# Patient Record
Sex: Male | Born: 1935
Health system: Southern US, Community
[De-identification: ages and names within clinical notes are randomized; demographics above are authoritative.]

## PROBLEM LIST (undated history)

## (undated) DIAGNOSIS — M109 Gout, unspecified: Secondary | ICD-10-CM

## (undated) DIAGNOSIS — I10 Essential (primary) hypertension: Secondary | ICD-10-CM

## (undated) DIAGNOSIS — J45909 Unspecified asthma, uncomplicated: Secondary | ICD-10-CM

## (undated) DIAGNOSIS — N289 Disorder of kidney and ureter, unspecified: Secondary | ICD-10-CM

## (undated) DIAGNOSIS — Z923 Personal history of irradiation: Secondary | ICD-10-CM

## (undated) DIAGNOSIS — C4491 Basal cell carcinoma of skin, unspecified: Secondary | ICD-10-CM

## (undated) DIAGNOSIS — N400 Enlarged prostate without lower urinary tract symptoms: Secondary | ICD-10-CM

## (undated) DIAGNOSIS — I480 Paroxysmal atrial fibrillation: Secondary | ICD-10-CM

## (undated) DIAGNOSIS — K219 Gastro-esophageal reflux disease without esophagitis: Secondary | ICD-10-CM

## (undated) HISTORY — PX: TONSILLECTOMY: SUR1361

## (undated) HISTORY — DX: Disorder of kidney and ureter, unspecified: N28.9

## (undated) HISTORY — PX: OTHER SURGICAL HISTORY: SHX169

## (undated) HISTORY — PX: EYE SURGERY: SHX253

## (undated) HISTORY — DX: Essential (primary) hypertension: I10

## (undated) HISTORY — PX: NOSE SURGERY: SHX723

## (undated) HISTORY — DX: Basal cell carcinoma of skin, unspecified: C44.91

## (undated) HISTORY — PX: TRANSURETHRAL RESECTION OF PROSTATE: SHX73

## (undated) HISTORY — DX: Gout, unspecified: M10.9

## (undated) HISTORY — DX: Benign prostatic hyperplasia without lower urinary tract symptoms: N40.0

## (undated) HISTORY — DX: Personal history of irradiation: Z92.3

---

## 2005-02-03 ENCOUNTER — Encounter: Admission: RE | Admit: 2005-02-03 | Discharge: 2005-02-03 | Payer: Self-pay | Admitting: Internal Medicine

## 2005-02-04 ENCOUNTER — Encounter: Admission: RE | Admit: 2005-02-04 | Discharge: 2005-02-04 | Payer: Self-pay | Admitting: Internal Medicine

## 2005-02-28 ENCOUNTER — Encounter: Admission: RE | Admit: 2005-02-28 | Discharge: 2005-02-28 | Payer: Self-pay | Admitting: Urology

## 2005-03-04 ENCOUNTER — Ambulatory Visit (HOSPITAL_COMMUNITY): Admission: RE | Admit: 2005-03-04 | Discharge: 2005-03-04 | Payer: Self-pay | Admitting: Urology

## 2005-03-04 ENCOUNTER — Ambulatory Visit (HOSPITAL_BASED_OUTPATIENT_CLINIC_OR_DEPARTMENT_OTHER): Admission: RE | Admit: 2005-03-04 | Discharge: 2005-03-04 | Payer: Self-pay | Admitting: Urology

## 2006-01-24 ENCOUNTER — Encounter: Admission: RE | Admit: 2006-01-24 | Discharge: 2006-01-24 | Payer: Self-pay | Admitting: Internal Medicine

## 2009-12-10 ENCOUNTER — Ambulatory Visit (HOSPITAL_COMMUNITY): Admission: RE | Admit: 2009-12-10 | Discharge: 2009-12-10 | Payer: Self-pay | Admitting: Chiropractic Medicine

## 2010-08-08 ENCOUNTER — Encounter: Payer: Self-pay | Admitting: Internal Medicine

## 2010-12-03 NOTE — Op Note (Signed)
NAME:  Richard Vincent, Richard Vincent NO.:  [id number]   MEDICAL RECORD NO.:  [id number]          PATIENT TYPE:  AMB   LOCATION:  [facility]                         FACILITY:  [facility]   PHYSICIAN:  Courtney Paris, M.D.DATE OF BIRTH:  [birth year]   DATE OF PROCEDURE:  03/04/2005  DATE OF DISCHARGE:                                 OPERATIVE REPORT   PREOPERATIVE DIAGNOSES:  Obstructive uropathy with decompensation of the  bladder, chronic renal insufficiency.   POSTOPERATIVE DIAGNOSES:  Obstructive uropathy with decompensation of the  bladder, chronic renal insufficiency.   OPERATION:  Suprapubic cystostomy and transurethral needle ablation of the  prostate.   ANESTHESIA:  General.   SURGEON:  Courtney Paris, M.D.   BRIEF HISTORY:  This 77 year old patient has a decompensating bladder with  renal insufficiency for a TUNA procedure and suprapubic tube placement. He  has been in retention x2 in the past with a very large prostate. His PSA has  been high in the past in the 13-15 range and been on Proscar since 2003. His  PSAs are much lower, his last one was 3.6 in March 2006. He has chronic  renal insufficiency, his creatinine recently increased to 2.7 with  hydronephrosis on ultrasound and with a Foley catheter the creatinine came  down to 2.1 to 2.2 where it is now. Creatinine was 1.2 in June 2002. He  enters now for a transurethral needle ablation of the prostate as his 170  gram gland is too large for a TURP and with his renal insufficiency, did not  want to do a suprapubic prostatectomy at this time. This can also be done as  an outpatient and hopefully will help open him up and preserve his renal  function.   DESCRIPTION OF PROCEDURE:  The patient was placed on the operating table in  the dorsal lithotomy position after satisfactory induction of general  anesthesia. The Foley catheter was removed, he was given IV antibiotics and  a L[place]ley tractor was passed per  urethra and then pushed up on the anterior  bladder wall. I could feel the tip of the sound and was able to cut down on  this with a knife until I could push the sound through the lower abdominal  skin. The tractor blades were then opened and a 2-0 nylon suture was passed  through the blades and through the eye of the #18 Foley catheter and tied.  This was then pulled back through the urethral meatus. The stitch was cut  and then the catheter was then back to where I thought it was in the bladder  and inflated the tube. When I irrigated this fluid came per urethra. I then  deflated the balloon and then using the cystoscope under direct vision could  push this back just inside the bladder neck and then inflated the tube to  make sure it was in the bladder and it irrigated nicely at that point. He  had a very long prostate about 6.5 cm in length. The measurement of the  transrectal ultrasound was 170 grams. The cystoscope with the TUNA procedure  was then passed and under direct vision the bladder neck was identified and  the first stick of the needles at the bladder neck in the midline was with  14 mm extension. The treatment took 2 minutes and 40 seconds heating the  tissue up to 118 degrees centigrade. Then the two lateral bladder neck  incisions were done with 22 mm needle extensions with good tissue ablation.  Several transverse punctures were made with 22 mm needle extensions until a  total of 7 were done and it looks like I was able in the middle of the  prostate. When I pulled back, I could see the veru. I then did the  verumontanum laterally with 20 mm needle extensions on either side and then  did several more transverse sticks for a total of 15 but the transverse were  all done with 22 mm needle extensions. The scope was then removed and a #22  Foley catheter was inserted and the bladder was irrigated, it was slightly  pink when I irrigated this and the suprapubic tube also  irrigated well. The  suprapubic tube was left clamped off and the urethral catheter attached to a  drainage bag and he was taken to the recovery room in good condition. He had  a B&O suppository and some Toradol given. The plan is to take out the  urethral catheter in three days and just have him drain his bladder with the  suprapubic tube each time he voids for a residual. Hopefully his renal  function will be protected with this and hopefully over time the prostate  will open up where we can remove the suprapubic tube. The patient tolerated  this well. There was minimal blood loss.      Courtney Paris, M.D.  Electronically Signed     HMK/MEDQ  D:  03/04/2005  T:  03/04/2005  Job:  [id number]

## 2011-12-14 ENCOUNTER — Ambulatory Visit: Payer: Self-pay | Admitting: Cardiology

## 2012-01-04 ENCOUNTER — Other Ambulatory Visit: Payer: Self-pay | Admitting: Cardiology

## 2012-01-04 ENCOUNTER — Encounter: Payer: Self-pay | Admitting: Cardiology

## 2012-01-04 ENCOUNTER — Ambulatory Visit (INDEPENDENT_AMBULATORY_CARE_PROVIDER_SITE_OTHER): Payer: Medicare Other | Admitting: Cardiology

## 2012-01-04 VITALS — BP 132/64 | HR 50 | Ht 73.0 in | Wt 216.6 lb

## 2012-01-04 DIAGNOSIS — M109 Gout, unspecified: Secondary | ICD-10-CM

## 2012-01-04 DIAGNOSIS — R002 Palpitations: Secondary | ICD-10-CM

## 2012-01-04 DIAGNOSIS — R0600 Dyspnea, unspecified: Secondary | ICD-10-CM

## 2012-01-04 DIAGNOSIS — R0609 Other forms of dyspnea: Secondary | ICD-10-CM

## 2012-01-04 DIAGNOSIS — I10 Essential (primary) hypertension: Secondary | ICD-10-CM | POA: Insufficient documentation

## 2012-01-04 MED ORDER — METOPROLOL SUCCINATE ER 50 MG PO TB24: 50.0000 mg | ORAL_TABLET | Freq: Every day | ORAL | Status: DC

## 2012-01-04 NOTE — Progress Notes (Signed)
  HPI: 78 year old male for evaluation of palpitations. He describes occasional palpitations for approximately one year. They are sudden in onset and described as his heart racing. There is associated dyspnea and mild dizziness but no syncope or chest pain. Approximately 2 months ago he had a severe episode for 20 minutes. He checked his heart rate and it was 160. He therefore presents for further evaluation.  Current Outpatient Prescriptions  Medication Sig Dispense Refill  . allopurinol (ZYLOPRIM) 300 MG tablet Take 300 mg by mouth daily.      Marland Kitchen CALCIUM-MAGNESIUM-ZINC PO Take by mouth daily.      . Cholecalciferol 1000 UNITS capsule Take 1,000 Units by mouth daily.      . finasteride (PROSCAR) 5 MG tablet Take 5 mg by mouth daily.      . fish oil-omega-3 fatty acids 1000 MG capsule Take 1 g by mouth daily.      . Potassium 99 MG TABS Take 99 mg by mouth daily.      Marland Kitchen terazosin (HYTRIN) 10 MG capsule Take 10 mg by mouth.      . vitamin C (ASCORBIC ACID) 500 MG tablet Take 500 mg by mouth daily.        Allergies  Allergen Reactions  . Latex     Mild rash    Past Medical History  Diagnosis Date  . Hypertension   . Gout   . BPH (benign prostatic hyperplasia)   . Renal insufficiency   . Basal cell cancer     Past Surgical History  Procedure Date  . Transurethral resection of prostate   . Tonsillectomy   . Skin cancer removed     History   Social History  . Marital Status: Married    Spouse Name: N/A    Number of Children: 6  . 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 Smoking status: Former Smoker    Quit date: 10/17/1994  . Smokeless tobacco: Not on file  . Alcohol Use: Yes     Occasional  . Drug Use: Not on file  . Sexually Active: Not on file   Other Topics Concern  . Not on file   Social History Narrative  . No narrative on file    Family History  Problem Relation Age of Onset  . Coronary artery  disease      No family history    ROS: occasional leg cramps and hematochezia attributed to hemorrhoids but no fevers or chills, productive cough, hemoptysis, dysphasia, odynophagia, melena, dysuria, hematuria, rash, seizure activity, orthopnea, PND, pedal edema, claudication. Remaining systems are negative.  Physical Exam:   Blood pressure 132/64, pulse 50, height 6\' 1"  (1.854 m), weight 98.249 kg (216 lb 9.6 oz).  General:  Well developed/well nourished in NAD Skin warm/dry Patient not depressed No peripheral clubbing Back-normal HEENT-normal/normal eyelids Neck supple/normal carotid upstroke bilaterally; no bruits; no JVD; no thyromegaly chest - CTA/ normal expansion CV - Bradycardic, regular rhythm/normal S1 and S2; no murmurs, rubs or gallops;  PMI nondisplaced Abdomen -NT/ND, no HSM, no mass, + bowel sounds, no bruit 2+ femoral pulses, no bruits Ext-no edema, chords, 2+ DP Neuro-grossly nonfocal  ECG Sinus bradycardia at a rate of 50. Normal axis. Not rule out prior septal infarct.

## 2012-01-04 NOTE — Assessment & Plan Note (Signed)
Echocardiogram to quantify LV function. Exercise treadmill for risk stratification.

## 2012-01-04 NOTE — Patient Instructions (Addendum)
Your physician recommends that you schedule a follow-up appointment in: 6-8 WEEKS WITH DR CRENSHAW IN [place]  STOP ATENOLOL  START TOPROL (METOPROLOL SUCC) 50 MG ONCE DAILY WITH FOOD  Your physician recommends that you HAVE LAB WORK TODAY  Your physician has recommended that you wear an event monitor. Event monitors are medical devices that record the heart's electrical activity. Doctors most often [place] these monitors to diagnose arrhythmias. Arrhythmias are problems with the speed or rhythm of the heartbeat. The monitor is a small, portable device. You can wear one while you do your normal daily activities. This is usually used to diagnose what is causing palpitations/syncope (passing out).   Your physician has requested that you have an echocardiogram. Echocardiography is a painless test that uses sound waves to create images of your heart. It provides your doctor with information about the size and shape of your heart and how well your heart's chambers and valves are working. This procedure takes approximately one hour. There are no restrictions for this procedure.   Your physician has requested that you have an exercise tolerance test. For further information please visit [email or web address] Please also follow instruction sheet, as given    Exercise Stress Electrocardiography An exercise stress test is a heart test (EKG) which is done while you are moving. You will walk on a treadmill. This test will tell your doctor how your heart does when it is forced to work harder and how much activity you can safely handle. BEFORE THE TEST  Wear shorts or athletic pants.   Wear comfortable tennis shoes.   Women need to wear a bra that allows patches to be put on under it.  TEST  An EKG cable will be attached to your waist. This cable is hooked up to patches, which look like round stickers stuck to your chest.   You will be asked to walk on the treadmill.   You will walk until you are too  tired or until you are told to stop.   Tell the doctor right away if you have:   Chest pain.   Leg cramps.   Shortness of breath.   Dizziness.   The test may last 30 minutes to 1 hour. The timing depends on your physical condition and the condition of your heart.  AFTER THE TEST  You will rest for about 6 minutes. During this time, your heart rhythm and blood pressure will be checked.   The testing equipment will be removed from your body and you can get dressed.   You may go home or back to your hospital room. You may keep doing all your usual activities as told by your doctor.  Finding out the results of your test Ask when your test results will be ready. Make sure you get your test results. Document Released: 12/21/2007 Document Revised: 06/23/2011 Document Reviewed: 12/21/2007 [facility] Patient Information 2012 [place], [place].

## 2012-01-04 NOTE — Assessment & Plan Note (Signed)
Patient describes intermittent palpitations. He did check his heart rate with his most recent episode and it was approximately 160. His electrocardiogram shows sinus bradycardia. Plan CardioNet to further evaluate. Question SVT versus atrial fibrillation. Further therapy based on results. Check TSH and echocardiogram.

## 2012-01-04 NOTE — Assessment & Plan Note (Addendum)
Patient has mild renal insufficiency and atenolol is cleared through the kidneys. Discontinue atenolol and treat with Toprol 50 mg daily.

## 2012-01-05 LAB — TSH: TSH: 2.034 u[IU]/mL (ref 0.350–4.500)

## 2012-01-09 ENCOUNTER — Encounter: Payer: Self-pay | Admitting: Cardiology

## 2012-01-20 ENCOUNTER — Encounter: Payer: Medicare Other | Admitting: Nurse Practitioner

## 2012-01-27 ENCOUNTER — Ambulatory Visit (HOSPITAL_COMMUNITY): Payer: Medicare Other | Attending: Cardiovascular Disease | Admitting: Radiology

## 2012-01-27 DIAGNOSIS — I517 Cardiomegaly: Secondary | ICD-10-CM | POA: Insufficient documentation

## 2012-01-27 DIAGNOSIS — R002 Palpitations: Secondary | ICD-10-CM | POA: Insufficient documentation

## 2012-01-27 DIAGNOSIS — R0989 Other specified symptoms and signs involving the circulatory and respiratory systems: Secondary | ICD-10-CM

## 2012-01-27 DIAGNOSIS — R0609 Other forms of dyspnea: Secondary | ICD-10-CM | POA: Insufficient documentation

## 2012-01-27 DIAGNOSIS — I10 Essential (primary) hypertension: Secondary | ICD-10-CM | POA: Insufficient documentation

## 2012-01-27 NOTE — Progress Notes (Signed)
Echocardiogram performed.  

## 2012-01-31 ENCOUNTER — Telehealth: Payer: Self-pay | Admitting: Cardiology

## 2012-01-31 NOTE — Telephone Encounter (Signed)
PT AWARE OF ECHO RESULTS./CY 

## 2012-01-31 NOTE — Telephone Encounter (Signed)
Fu call °Pt returning your call  °

## 2012-02-08 ENCOUNTER — Ambulatory Visit (INDEPENDENT_AMBULATORY_CARE_PROVIDER_SITE_OTHER): Payer: Medicare Other | Admitting: Physician Assistant

## 2012-02-08 ENCOUNTER — Encounter: Payer: Self-pay | Admitting: Physician Assistant

## 2012-02-08 DIAGNOSIS — R0600 Dyspnea, unspecified: Secondary | ICD-10-CM

## 2012-02-08 DIAGNOSIS — R002 Palpitations: Secondary | ICD-10-CM

## 2012-02-08 NOTE — Procedures (Signed)
Exercise Treadmill Test  Pre-Exercise Testing Evaluation Rhythm: sinus bradycardia  Rate: 43   PR:  .17 QRS:  .08  QT:  .47 QTc: .40     Test  Exercise Tolerance Test Ordering MD: Olga Millers, MD  Interpreting MD: Tereso Newcomer PA-C  Unique Test No: 1  Treadmill:  1  Indication for ETT: Palps  Contraindication to ETT: No   Stress Modality: exercise - treadmill  Cardiac Imaging Performed: non   Protocol: standard Bruce - maximal  Max BP:  193/52  Max MPHR (bpm):  143 85% MPR (bpm):  122  MPHR obtained (bpm):  98 % MPHR obtained:  68%  Reached 85% MPHR (min:sec):  n/a Total Exercise Time (min-sec):  5:33  Workload in METS:  7.0 Borg Scale: 15  Reason ETT Terminated:  patient's desire to stop    ST Segment Analysis At Rest: normal ST segments - no evidence of significant ST depression With Exercise: non-specific ST changes  Other Information Arrhythmia:  occasional PVC Angina during ETT:  absent (0) Quality of ETT:  non-diagnostic  ETT Interpretation:  Patient did not achieve target heart rate  Comments: Fair exercise tolerance. No chest pain. There was dyspnea. Normal BP response to exercise. Patient did not achieve target HR. No significant ST changes at submaximal exercise.   Recommendations: Arrange Lexiscan Myoview. Follow up with Dr. Olga Millers as directed. Tereso Newcomer, PA-C  9:17 AM 02/08/2012

## 2012-02-08 NOTE — Patient Instructions (Addendum)
Your physician has requested that you have a lexiscan myoview. For further information please visit [email or web address]. Please follow instruction sheet, as given.   

## 2012-02-09 ENCOUNTER — Telehealth: Payer: Self-pay | Admitting: *Deleted

## 2012-02-09 NOTE — Telephone Encounter (Signed)
Left message for pt to call, monitor reviewed by dr Jens Som shows sinus brady with rare PAC

## 2012-02-10 NOTE — Telephone Encounter (Signed)
Spoke with pt, aware of monitor results. Pt wants to cancel myoview. He reports he stopped on the treadmill because he was tired, he could have gone longer. He has read about the stress test and does not want to do that until he is seen by dr Jens Som 03-07-12. Offered to move appt up with dr Jens Som but pt preferred to wait. myoview cancelled. Pt to call prior to appt with problems.

## 2012-02-10 NOTE — Telephone Encounter (Signed)
Pt rtn call to debra °

## 2012-02-13 ENCOUNTER — Other Ambulatory Visit (HOSPITAL_COMMUNITY): Payer: Medicare Other

## 2012-02-22 ENCOUNTER — Ambulatory Visit: Payer: Medicare Other | Admitting: Cardiology

## 2012-02-23 ENCOUNTER — Other Ambulatory Visit (HOSPITAL_COMMUNITY): Payer: Medicare Other

## 2012-03-07 ENCOUNTER — Ambulatory Visit: Payer: Medicare Other | Admitting: Cardiology

## 2012-03-22 ENCOUNTER — Encounter: Payer: Self-pay | Admitting: Cardiology

## 2012-03-22 ENCOUNTER — Ambulatory Visit (INDEPENDENT_AMBULATORY_CARE_PROVIDER_SITE_OTHER): Payer: Medicare Other | Admitting: Cardiology

## 2012-03-22 VITALS — BP 165/66 | HR 56 | Wt 219.0 lb

## 2012-03-22 DIAGNOSIS — R002 Palpitations: Secondary | ICD-10-CM

## 2012-03-22 DIAGNOSIS — I10 Essential (primary) hypertension: Secondary | ICD-10-CM

## 2012-03-22 DIAGNOSIS — R0600 Dyspnea, unspecified: Secondary | ICD-10-CM

## 2012-03-22 DIAGNOSIS — R0609 Other forms of dyspnea: Secondary | ICD-10-CM

## 2012-03-22 DIAGNOSIS — R0989 Other specified symptoms and signs involving the circulatory and respiratory systems: Secondary | ICD-10-CM

## 2012-03-22 MED ORDER — AMLODIPINE BESYLATE 5 MG PO TABS: 5.0000 mg | ORAL_TABLET | Freq: Every day | ORAL | Status: DC

## 2012-03-22 NOTE — Patient Instructions (Addendum)
Your physician wants you to follow-up in: 6 MONTHS WITH DR CRENSHAW You will receive a reminder letter in the mail two months in advance. If you don't receive a letter, please call our office to schedule the follow-up appointment.   START AMLODIPINE 5 MG ONCE DAILY 

## 2012-03-22 NOTE — Assessment & Plan Note (Signed)
Patient's blood pressure is elevated. Continue Toprol. Add Norvasc 5 mg daily. Continue to titrate as needed.

## 2012-03-22 NOTE — Progress Notes (Signed)
   HPI: 78 year old male for followup of palpitations. Echocardiogram in July of 2013 showed mild left ventricular hypertrophy and normal LV function. There was mild to moderate left atrial enlargement. The ascending aorta was mildly dilated. CardioNet in July of 2013 showed sinus bradycardia with occasional PAC. TSH 2.034. Exercise treadmill in July of 2013 showed no ST changes but patient did not achieve target heart rate. A Myoview was scheduled. Since I last saw him, he has dyspnea with more extreme activities but not routine activities. No orthopnea, PND, pedal edema, syncope or chest pain. He has brief flutters but no sustained palpitations. He did not have any sustained palpitations with a monitor in place.  Current Outpatient Prescriptions  Medication Sig Dispense Refill  . allopurinol (ZYLOPRIM) 300 MG tablet Take 300 mg by mouth daily.      Marland Kitchen CALCIUM-MAGNESIUM-ZINC PO Take by mouth daily.      . Cholecalciferol 1000 UNITS capsule Take 1,000 Units by mouth daily.      . finasteride (PROSCAR) 5 MG tablet Take 5 mg by mouth daily.      . fish oil-omega-3 fatty acids 1000 MG capsule Take 1 g by mouth daily.      . metoprolol succinate (TOPROL-XL) 50 MG 24 hr tablet Take 1 tablet (50 mg total) by mouth daily. Take with or immediately following a meal.  90 tablet  3  . Potassium 99 MG TABS Take 99 mg by mouth daily.      Marland Kitchen terazosin (HYTRIN) 10 MG capsule Take 10 mg by mouth.      . vitamin C (ASCORBIC ACID) 500 MG tablet Take 500 mg by mouth daily.         Past Medical History  Diagnosis Date  . Hypertension   . Gout   . BPH (benign prostatic hyperplasia)   . Renal insufficiency   . Basal cell cancer     Past Surgical History  Procedure Date  . Transurethral resection of prostate   . Tonsillectomy   . Skin cancer removed     History   Social History  . Marital Status: Married    Spouse Name: N/A    Number of Children: 6  . 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 Smoking status: Former Smoker    Quit date: 10/17/1994  . Smokeless tobacco: Not on file  . Alcohol Use: Yes     Occasional  . Drug Use: Not on file  . Sexually Active: Not on file   Other Topics Concern  . Not on file   Social History Narrative  . No narrative on file    ROS: no fevers or chills, productive cough, hemoptysis, dysphasia, odynophagia, melena, hematochezia, dysuria, hematuria, rash, seizure activity, orthopnea, PND, pedal edema, claudication. Remaining systems are negative.  Physical Exam: Well-developed well-nourished in no acute distress.  Skin is warm and dry.  HEENT is normal.  Neck is supple.  Chest is clear to auscultation with normal expansion.  Cardiovascular exam is bradycardic  Abdominal exam nontender or distended. No masses palpated. Extremities show no edema. neuro grossly intact

## 2012-03-22 NOTE — Assessment & Plan Note (Signed)
Previous treadmill was negative the patient did not achieve target heart rate. I do not think we need to pursue a nuclear study as his LV function is normal and he has not had chest pain.

## 2012-03-22 NOTE — Assessment & Plan Note (Signed)
Patient's echocardiogram shows normal LV function. CardioNet was unremarkable but he did not have sustained palpitations with it in place. Continue beta blocker. If he has more sustained episodes in the future we will try and obtain a rhythm strip or electrocardiogram to document his rhythm disturbance.

## 2012-11-09 ENCOUNTER — Other Ambulatory Visit: Payer: Self-pay | Admitting: Cardiology

## 2013-02-09 ENCOUNTER — Other Ambulatory Visit: Payer: Self-pay | Admitting: Cardiology

## 2013-05-01 ENCOUNTER — Other Ambulatory Visit: Payer: Self-pay | Admitting: Cardiology

## 2013-06-03 ENCOUNTER — Other Ambulatory Visit: Payer: Self-pay | Admitting: *Deleted

## 2013-06-03 MED ORDER — METOPROLOL SUCCINATE ER 50 MG PO TB24: 50.0000 mg | ORAL_TABLET | Freq: Every day | ORAL | Status: DC

## 2013-06-10 ENCOUNTER — Encounter: Payer: Self-pay | Admitting: Cardiology

## 2013-06-10 ENCOUNTER — Ambulatory Visit (INDEPENDENT_AMBULATORY_CARE_PROVIDER_SITE_OTHER): Payer: Medicare PPO | Admitting: Cardiology

## 2013-06-10 VITALS — BP 158/80 | HR 58 | Ht 73.0 in | Wt 209.0 lb

## 2013-06-10 DIAGNOSIS — I10 Essential (primary) hypertension: Secondary | ICD-10-CM

## 2013-06-10 DIAGNOSIS — R002 Palpitations: Secondary | ICD-10-CM

## 2013-06-10 DIAGNOSIS — R0609 Other forms of dyspnea: Secondary | ICD-10-CM

## 2013-06-10 DIAGNOSIS — R0600 Dyspnea, unspecified: Secondary | ICD-10-CM

## 2013-06-10 NOTE — Assessment & Plan Note (Signed)
Blood pressure is mildly elevated. However he checks this at home and it is typically controlled. Continue present medications and follow.

## 2013-06-10 NOTE — Progress Notes (Signed)
      HPI: FU palpitations. Echocardiogram in July of 2013 showed mild left ventricular hypertrophy and normal LV function. There was mild to moderate left atrial enlargement. The ascending aorta was mildly dilated. CardioNet in July of 2013 showed sinus bradycardia with occasional PAC. TSH 2.034. Exercise treadmill in July of 2013 showed no ST changes but patient did not achieve target heart rate. I last saw him in September 2013. Since then, the patient has dyspnea with more extreme activities but not with routine activities. It is relieved with rest. It is not associated with chest pain. There is no orthopnea, PND or pedal edema. There is no syncope or palpitations. There is no exertional chest pain.   Current Outpatient Prescriptions  Medication Sig Dispense Refill  . amLODipine (NORVASC) 5 MG tablet TAKE 1 TABLET (5 MG TOTAL) BY MOUTH DAILY.  90 tablet  10  . CALCIUM-MAGNESIUM-ZINC PO Take by mouth daily.      . Cholecalciferol 1000 UNITS capsule Take 1,000 Units by mouth daily.      . finasteride (PROSCAR) 5 MG tablet Take 5 mg by mouth daily.      . fish oil-omega-3 fatty acids 1000 MG capsule Take 1 g by mouth daily.      . fluocinonide cream (LIDEX) 0.05 % As directed      . metoprolol succinate (TOPROL-XL) 50 MG 24 hr tablet Take 1 tablet (50 mg total) by mouth daily. Take with or immediately following a meal.  30 tablet  0  . tamsulosin (FLOMAX) 0.4 MG CAPS capsule Take 1 capsule by mouth daily.      . vitamin C (ASCORBIC ACID) 500 MG tablet Take 500 mg by mouth daily.       No current facility-administered medications for this visit.     Past Medical History  Diagnosis Date  . Hypertension   . Gout   . BPH (benign prostatic hyperplasia)   . Renal insufficiency   . Basal cell cancer     Past Surgical History  Procedure Laterality Date  . Transurethral resection of prostate    . Tonsillectomy    . Skin cancer removed      History   Social History  . Marital Status:  Married    Spouse Name: N/A    Number of Children: 6  . 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 Smoking status: Former Smoker    Quit date: 10/17/1994  . Smokeless tobacco: Not on file  . Alcohol Use: Yes     Comment: Occasional  . Drug Use: Not on file  . Sexual Activity: Not on file   Other Topics Concern  . Not on file   Social History Narrative  . No narrative on file    ROS: no fevers or chills, productive cough, hemoptysis, dysphasia, odynophagia, melena, hematochezia, dysuria, hematuria, rash, seizure activity, orthopnea, PND, pedal edema, claudication. Remaining systems are negative.  Physical Exam: Well-developed well-nourished in no acute distress.  Skin is warm and dry.  HEENT is normal.  Neck is supple.  Chest is clear to auscultation with normal expansion.  Cardiovascular exam is regular rate and rhythm.  Abdominal exam nontender or distended. No masses palpated. Extremities show no edema. neuro grossly intact  ECG sinus rhythm at a rate of 58. Right axis deviation.

## 2013-06-10 NOTE — Assessment & Plan Note (Signed)
Previous evaluation negative. No plans for furtherworkup.

## 2013-06-10 NOTE — Assessment & Plan Note (Signed)
Controlled. Continue beta blocker. 

## 2013-06-10 NOTE — Patient Instructions (Signed)
Your physician recommends that you schedule a follow-up appointment in: ONE YEAR WITH DR Jens Som

## 2013-07-02 ENCOUNTER — Other Ambulatory Visit: Payer: Self-pay | Admitting: Cardiology

## 2014-01-26 ENCOUNTER — Other Ambulatory Visit: Payer: Self-pay | Admitting: Cardiology

## 2014-01-31 ENCOUNTER — Other Ambulatory Visit: Payer: Self-pay | Admitting: Cardiology

## 2014-04-30 ENCOUNTER — Other Ambulatory Visit: Payer: Self-pay | Admitting: Cardiology

## 2014-05-10 ENCOUNTER — Other Ambulatory Visit: Payer: Self-pay | Admitting: Cardiology

## 2014-05-20 ENCOUNTER — Ambulatory Visit (INDEPENDENT_AMBULATORY_CARE_PROVIDER_SITE_OTHER): Payer: Medicare PPO | Admitting: Cardiology

## 2014-05-20 ENCOUNTER — Encounter: Payer: Self-pay | Admitting: Cardiology

## 2014-05-20 VITALS — BP 130/60 | HR 56 | Ht 73.0 in | Wt 214.4 lb

## 2014-05-20 DIAGNOSIS — I10 Essential (primary) hypertension: Secondary | ICD-10-CM

## 2014-05-20 DIAGNOSIS — R002 Palpitations: Secondary | ICD-10-CM

## 2014-05-20 DIAGNOSIS — R0600 Dyspnea, unspecified: Secondary | ICD-10-CM

## 2014-05-20 NOTE — Progress Notes (Signed)
      HPI: FU palpitations. Echocardiogram in July of 2013 showed mild left ventricular hypertrophy and normal LV function. There was mild to moderate left atrial enlargement. The ascending aorta was mildly dilated. CardioNet in July of 2013 showed sinus bradycardia with occasional PAC. TSH 2.034. Exercise treadmill in July of 2013 showed no ST changes but patient did not achieve target heart rate. 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 Since I last saw him, the patient has dyspnea with more extreme activities but not with routine activities. It is relieved with rest. It is not associated with chest pain. There is no orthopnea, PND or pedal edema. There is no syncope or palpitations. There is no exertional chest pain.   Current Outpatient Prescriptions  Medication Sig Dispense Refill  . Cholecalciferol 1000 UNITS capsule Take 1,000 Units by mouth daily.    . finasteride (PROSCAR) 5 MG tablet Take 5 mg by mouth daily.    . fish oil-omega-3 fatty acids 1000 MG capsule Take 1 g by mouth daily.    . metoprolol succinate (TOPROL-XL) 50 MG 24 hr tablet TAKE 1 TABLET (50 MG TOTAL) BY MOUTH DAILY. TAKE WITH OR IMMEDIATELY FOLLOWING A MEAL. 30 tablet 2  . tamsulosin (FLOMAX) 0.4 MG CAPS capsule Take 1 capsule by mouth daily.    Marland Kitchen amLODipine (NORVASC) 5 MG tablet TAKE 1 TABLET (5 MG TOTAL) BY MOUTH DAILY. 90 tablet 0   No current facility-administered medications for this visit.     Past Medical History  Diagnosis Date  . Hypertension   . Gout   . BPH (benign prostatic hyperplasia)   . Renal insufficiency   . Basal cell cancer     Past Surgical History  Procedure Laterality Date  . Transurethral resection of prostate    . Tonsillectomy    . Skin cancer removed      History   Social History  . Marital Status: Married    Spouse Name: N/A    Number of Children: 6  . 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 Smoking status: Former Smoker    Quit date: 10/17/1994  . Smokeless tobacco: Not on file  . Alcohol Use: Yes     Comment: Occasional  . Drug Use: Not on file  . Sexual Activity: Not on file   Other Topics Concern  . Not on file   Social History Narrative    ROS: no fevers or chills, productive cough, hemoptysis, dysphasia, odynophagia, melena, hematochezia, dysuria, hematuria, rash, seizure activity, orthopnea, PND, pedal edema, claudication. Remaining systems are negative.  Physical Exam: Well-developed well-nourished in no acute distress.  Skin is warm and dry.  HEENT is normal.  Neck is supple.  Chest is clear to auscultation with normal expansion.  Cardiovascular exam is regular rate and rhythm.  Abdominal exam nontender or distended. No masses palpated. Extremities show no edema. neuro grossly intact  ECG Sinus rhythm at a rate of 56. Normal axis. Cannot rule out prior septal infarct.

## 2014-05-20 NOTE — Assessment & Plan Note (Signed)
Reasonably well controlled. Continue present dose of beta blocker.

## 2014-05-20 NOTE — Patient Instructions (Signed)
Your physician wants you to follow-up in: ONE YEAR WITH DR CRENSHAW You will receive a reminder letter in the mail two months in advance. If you don't receive a letter, please call our office to schedule the follow-up appointment.  

## 2014-05-20 NOTE — Assessment & Plan Note (Signed)
Previous workup negative. No further evaluation at this time.

## 2014-05-20 NOTE — Assessment & Plan Note (Signed)
Blood pressure controlled. Continue present medications. 

## 2014-06-25 ENCOUNTER — Ambulatory Visit: Payer: Medicare PPO | Admitting: Cardiology

## 2014-08-04 ENCOUNTER — Other Ambulatory Visit: Payer: Self-pay | Admitting: Cardiology

## 2014-08-08 ENCOUNTER — Other Ambulatory Visit: Payer: Self-pay | Admitting: Cardiology

## 2014-08-10 NOTE — Telephone Encounter (Signed)
Rx(s) sent to pharmacy electronically.  

## 2015-05-06 ENCOUNTER — Other Ambulatory Visit: Payer: Self-pay | Admitting: Cardiology

## 2015-05-18 NOTE — Progress Notes (Signed)
      HPI: FU palpitations. Echocardiogram in July of 2013 showed mild left ventricular hypertrophy and normal LV function. There was mild to moderate left atrial enlargement. The ascending aorta was mildly dilated. CardioNet in July of 2013 showed sinus bradycardia with occasional PAC. TSH 2.034. Exercise treadmill in July of 2013 showed no ST changes but patient did not achieve target heart rate. 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 Since I last saw him, He has dyspnea on exertion but no orthopnea, PND, pedal edema, chest pain or syncope. Palpitations are controlled. Mild fatigue.  Current Outpatient Prescriptions  Medication Sig Dispense Refill  . amLODipine (NORVASC) 5 MG tablet Take 1 tablet (5 mg total) by mouth daily. 90 tablet 3  . Cholecalciferol 1000 UNITS capsule Take 1,000 Units by mouth daily.    Marland Kitchen FIBER PO Take by mouth 2 (two) times daily.    . finasteride (PROSCAR) 5 MG tablet Take 5 mg by mouth daily.    . fish oil-omega-3 fatty acids 1000 MG capsule Take 1 g by mouth daily.    . metoprolol succinate (TOPROL-XL) 50 MG 24 hr tablet TAKE 1 TABLET (50 MG TOTAL) BY MOUTH DAILY. TAKE WITH OR IMMEDIATELY FOLLOWING A MEAL. 30 tablet 10  . tamsulosin (FLOMAX) 0.4 MG CAPS capsule Take 1 capsule by mouth daily.     No current facility-administered medications for this visit.     Past Medical History  Diagnosis Date  . Hypertension   . Gout   . BPH (benign prostatic hyperplasia)   . Renal insufficiency   . Basal cell cancer     Past Surgical History  Procedure Laterality Date  . Transurethral resection of prostate    . Tonsillectomy    . Skin cancer removed      Social History   Social History  . Marital Status: Married    Spouse Name: N/A  . Number of Children: 6  . 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 Smoking status:  Former Smoker    Quit date: 10/17/1994  . Smokeless tobacco: Not on file  . Alcohol Use: Yes     Comment: Occasional  . Drug Use: Not on file  . Sexual Activity: Not on file   Other Topics Concern  . Not on file   Social History Narrative    ROS: no fevers or chills, productive cough, hemoptysis, dysphasia, odynophagia, melena, hematochezia, dysuria, hematuria, rash, seizure activity, orthopnea, PND, pedal edema, claudication. Remaining systems are negative.  Physical Exam: Well-developed well-nourished in no acute distress.  Skin is warm and dry.  HEENT is normal.  Neck is supple.  Chest is clear to auscultation with normal expansion.  Cardiovascular exam is regular rate and rhythm.  Abdominal exam nontender or distended. No masses palpated. Extremities show no edema. neuro grossly intact  ECG Sinus rhythm at a rate of 61. Normal axis. Cannot rule out prior septal infarct.

## 2015-05-21 ENCOUNTER — Encounter: Payer: Self-pay | Admitting: Cardiology

## 2015-05-21 ENCOUNTER — Encounter: Payer: Self-pay | Admitting: *Deleted

## 2015-05-21 ENCOUNTER — Ambulatory Visit (INDEPENDENT_AMBULATORY_CARE_PROVIDER_SITE_OTHER): Payer: Medicare PPO | Admitting: Cardiology

## 2015-05-21 VITALS — BP 132/64 | HR 61 | Ht 73.0 in | Wt 222.0 lb

## 2015-05-21 DIAGNOSIS — R0600 Dyspnea, unspecified: Secondary | ICD-10-CM | POA: Diagnosis not present

## 2015-05-21 DIAGNOSIS — I10 Essential (primary) hypertension: Secondary | ICD-10-CM | POA: Diagnosis not present

## 2015-05-21 DIAGNOSIS — R002 Palpitations: Secondary | ICD-10-CM | POA: Diagnosis not present

## 2015-05-21 NOTE — Patient Instructions (Signed)
Medication Instructions:   NO CHANGE  Labwork:  Your physician recommends that you HAVE LAB WORK TODAY  Testing/Procedures:  Your physician has requested that you have an echocardiogram. Echocardiography is a painless test that uses sound waves to create images of your heart. It provides your doctor with information about the size and shape of your heart and how well your heart's chambers and valves are working. This procedure takes approximately one hour. There are no restrictions for this procedure.   Your physician has requested that you have an exercise tolerance test. For further information please visit [email or web address]. Please also follow instruction sheet, as given.    Follow-Up:  Your physician wants you to follow-up in: [place] will receive a reminder letter in the mail two months in advance. If you don't receive a letter, please call our office to schedule the follow-up appointment.   Exercise Stress Electrocardiogram An exercise stress electrocardiogram is a test to check how blood flows to your heart. It is done to find areas of poor blood flow. You will need to walk on a treadmill for this test. The electrocardiogram will record your heartbeat when you are at rest and when you are exercising. BEFORE THE PROCEDURE  Do not have drinks with caffeine or foods with caffeine for 24 hours before the test, or as told by your doctor. This includes coffee, tea (even decaf tea), sodas, chocolate, and cocoa.  Follow your doctor's instructions about eating and drinking before the test.  Ask your doctor what medicines you should or should not take before the test. Take your medicines with water unless told by your doctor not to.  If you use an inhaler, bring it with you to the test.  Bring a snack to eat after the test.  Do not  smoke for 4 hours before the test.  Do not put lotions, powders, creams, or oils on your chest before the test.  Wear  comfortable shoes and clothing. PROCEDURE  You will have patches put on your chest. Small areas of your chest may need to be shaved. Wires will be connected to the patches.  Your heart rate will be watched while you are resting and while you are exercising.  You will walk on the treadmill. The treadmill will slowly get faster to raise your heart rate.  The test will take about 1-2 hours. AFTER THE PROCEDURE  Your heart rate and blood pressure will be watched after the test.  You may return to your normal diet, activities, and medicines or as told by your doctor.   This information is not intended to replace advice given to you by your health care provider. Make sure you discuss any questions you have with your health care provider.   Document Released: 12/21/2007 Document Revised: 07/25/2014 Document Reviewed: 03/11/2013 Elsevier Interactive Patient Education [employer].

## 2015-05-21 NOTE — Assessment & Plan Note (Signed)
Reasonably well controlled. Continue beta blocker.

## 2015-05-21 NOTE — Assessment & Plan Note (Signed)
Blood pressure controlled. Continue present medications. 

## 2015-05-21 NOTE — Assessment & Plan Note (Signed)
Patient describes dyspnea on exertion. Etiology unclear. Will arrange exercise treadmill for risk stratification. Repeat echocardiogram to assess LV function. Check BNP.

## 2015-05-25 ENCOUNTER — Telehealth: Payer: Self-pay | Admitting: *Deleted

## 2015-05-25 DIAGNOSIS — R0600 Dyspnea, unspecified: Secondary | ICD-10-CM | POA: Diagnosis not present

## 2015-05-25 NOTE — Telephone Encounter (Signed)
Patient walked in to request that his med list be updated.. Vitamin D, fish oil, flomax.   Med list updated.

## 2015-05-26 LAB — BRAIN NATRIURETIC PEPTIDE: BRAIN NATRIURETIC PEPTIDE: 97.9 pg/mL (ref 0.0–100.0)

## 2015-05-29 ENCOUNTER — Other Ambulatory Visit: Payer: Self-pay

## 2015-05-29 ENCOUNTER — Ambulatory Visit (HOSPITAL_COMMUNITY): Payer: Medicare PPO | Attending: Cardiology

## 2015-05-29 DIAGNOSIS — I10 Essential (primary) hypertension: Secondary | ICD-10-CM | POA: Diagnosis not present

## 2015-05-29 DIAGNOSIS — R0600 Dyspnea, unspecified: Secondary | ICD-10-CM | POA: Insufficient documentation

## 2015-05-29 DIAGNOSIS — I517 Cardiomegaly: Secondary | ICD-10-CM | POA: Diagnosis not present

## 2015-05-29 DIAGNOSIS — I358 Other nonrheumatic aortic valve disorders: Secondary | ICD-10-CM | POA: Diagnosis not present

## 2015-05-29 DIAGNOSIS — Z87891 Personal history of nicotine dependence: Secondary | ICD-10-CM | POA: Diagnosis not present

## 2015-06-19 ENCOUNTER — Other Ambulatory Visit: Payer: Self-pay | Admitting: *Deleted

## 2015-06-19 DIAGNOSIS — R0600 Dyspnea, unspecified: Secondary | ICD-10-CM

## 2015-06-25 ENCOUNTER — Inpatient Hospital Stay (HOSPITAL_COMMUNITY): Admission: RE | Admit: 2015-06-25 | Payer: Medicare PPO | Source: Ambulatory Visit

## 2015-07-14 ENCOUNTER — Telehealth (HOSPITAL_COMMUNITY): Payer: Self-pay

## 2015-07-14 NOTE — Telephone Encounter (Signed)
Encounter complete. 

## 2015-07-16 ENCOUNTER — Ambulatory Visit (HOSPITAL_COMMUNITY)
Admission: RE | Admit: 2015-07-16 | Discharge: 2015-07-16 | Disposition: A | Discharge status: Home / Self Care | Payer: Medicare PPO | Source: Ambulatory Visit | Attending: Cardiovascular Disease | Admitting: Cardiovascular Disease

## 2015-07-16 DIAGNOSIS — R5383 Other fatigue: Secondary | ICD-10-CM | POA: Diagnosis not present

## 2015-07-16 DIAGNOSIS — R0600 Dyspnea, unspecified: Secondary | ICD-10-CM

## 2015-07-16 DIAGNOSIS — R002 Palpitations: Secondary | ICD-10-CM | POA: Insufficient documentation

## 2015-07-16 DIAGNOSIS — R079 Chest pain, unspecified: Secondary | ICD-10-CM | POA: Diagnosis not present

## 2015-07-16 DIAGNOSIS — Z87891 Personal history of nicotine dependence: Secondary | ICD-10-CM | POA: Diagnosis not present

## 2015-07-16 DIAGNOSIS — R0609 Other forms of dyspnea: Secondary | ICD-10-CM | POA: Insufficient documentation

## 2015-07-16 DIAGNOSIS — I10 Essential (primary) hypertension: Secondary | ICD-10-CM | POA: Insufficient documentation

## 2015-07-16 LAB — MYOCARDIAL PERFUSION IMAGING
CHL CUP NUCLEAR SSS: 5
CHL CUP RESTING HR STRESS: 53 {beats}/min
CSEPPHR: 68 {beats}/min
LV dias vol: 114 mL
LVSYSVOL: 53 mL
NUC STRESS TID: 1.3
SDS: 0
SRS: 5

## 2015-07-16 MED ORDER — TECHNETIUM TC 99M SESTAMIBI GENERIC - CARDIOLITE
32.2000 | Freq: Once | INTRAVENOUS | Status: AC | PRN
  Administered 2015-07-16: 32.2 via INTRAVENOUS

## 2015-07-16 MED ORDER — REGADENOSON 0.4 MG/5ML IV SOLN
0.4000 mg | Freq: Once | INTRAVENOUS | Status: AC
  Administered 2015-07-16: 0.4 mg via INTRAVENOUS

## 2015-07-16 MED ORDER — TECHNETIUM TC 99M SESTAMIBI GENERIC - CARDIOLITE
10.8000 | Freq: Once | INTRAVENOUS | Status: AC | PRN
  Administered 2015-07-16: 10.8 via INTRAVENOUS

## 2015-08-03 ENCOUNTER — Telehealth: Payer: Self-pay | Admitting: Cardiology

## 2015-08-03 NOTE — Telephone Encounter (Signed)
Pt called in wanting to know if he is going to have an appt to where Dr. Stanford Breed is going to explain the results for his Myocardial Perfusion that was done on 12/29. Please call back   Thanks

## 2015-08-03 NOTE — Telephone Encounter (Signed)
Reviewed results of Myocardio Perfusion test with patient along with Dr. Jacalyn Lefevre comments. Patient will call back to make a routine follow up OV for 6 months

## 2015-08-31 ENCOUNTER — Other Ambulatory Visit: Payer: Self-pay | Admitting: *Deleted

## 2015-08-31 MED ORDER — METOPROLOL SUCCINATE ER 50 MG PO TB24: 50.0000 mg | ORAL_TABLET | Freq: Every day | ORAL | Status: DC

## 2015-09-11 ENCOUNTER — Other Ambulatory Visit: Payer: Self-pay | Admitting: Cardiology

## 2015-09-11 MED ORDER — AMLODIPINE BESYLATE 5 MG PO TABS: 5.0000 mg | ORAL_TABLET | Freq: Every day | ORAL | Status: DC

## 2016-06-06 ENCOUNTER — Other Ambulatory Visit: Payer: Self-pay | Admitting: Cardiology

## 2016-08-24 ENCOUNTER — Other Ambulatory Visit: Payer: Self-pay | Admitting: Cardiology

## 2016-08-24 NOTE — Telephone Encounter (Signed)
Rx(s) sent to pharmacy electronically.  

## 2016-09-04 ENCOUNTER — Other Ambulatory Visit: Payer: Self-pay | Admitting: Cardiology

## 2016-09-10 ENCOUNTER — Other Ambulatory Visit: Payer: Self-pay | Admitting: Cardiology

## 2016-09-21 NOTE — Progress Notes (Signed)
HPI: FU palpitations. CardioNet in July of 2013 showed sinus bradycardia with occasional PAC. TSH 2.034. 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 Echocardiogram November 2016 showed normal LV function, grade 1 diastolic dysfunction and moderate left atrial enlargement. Nuclear study December 2016 showed ejection fraction 53%, prior infarct but no ischemia. Since I last saw him, the patient has dyspnea with more extreme activities but not with routine activities. It is relieved with rest. It is not associated with chest pain. There is no orthopnea, PND or pedal edema. There is no syncope or palpitations. There is no exertional chest pain.   Current Outpatient Prescriptions  Medication Sig Dispense Refill  . amLODipine (NORVASC) 5 MG tablet TAKE ONE TABLET BY MOUTH DAILY 90 tablet 1  . Cholecalciferol (VITAMIN D-3) 1000 UNITS CAPS Take 1 capsule by mouth daily.    Marland Kitchen FIBER PO Take by mouth daily. 8 grams daily    . finasteride (PROSCAR) 5 MG tablet Take 5 mg by mouth daily.    . metoprolol succinate (TOPROL-XL) 50 MG 24 hr tablet Take 1 tablet (50 mg total) by mouth daily. PLEASE CONTACT OFFICE FOR ADDITIONAL REFILLS FINAL ATTEMPT 7 tablet 0  . metoprolol succinate (TOPROL-XL) 50 MG 24 hr tablet Take 1 tablet (50 mg total) by mouth daily. Please call to schedule appointment for future refills, thanks! #1 attempt 30 tablet 0  . Omega-3 Fatty Acids (FISH OIL) 1200 MG CAPS Take 1 capsule by mouth daily.    . tamsulosin (FLOMAX) 0.4 MG CAPS capsule Take 2 capsules by mouth daily.      No current facility-administered medications for this visit.      Past Medical History:  Diagnosis Date  . Basal cell cancer   . BPH (benign prostatic hyperplasia)   . Gout   . Hypertension   . Renal insufficiency     Past Surgical History:  Procedure Laterality Date  . Skin cancer removed    . TONSILLECTOMY    .  TRANSURETHRAL RESECTION OF PROSTATE      Social History   Social History  . Marital status: Married    Spouse name: N/A  . Number of children: 6  . 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 Smoking status: Former Smoker    Quit date: 10/17/1994  . Smokeless tobacco: Never Used  . Alcohol use Yes     Comment: Occasional  . Drug use: Unknown  . Sexual activity: Not on file   Other Topics Concern  . Not on file   Social History Narrative  . No narrative on file    Family History  Problem Relation Age of Onset  . Coronary artery disease      No family history    ROS: no fevers or chills, productive cough, hemoptysis, dysphasia, odynophagia, melena, hematochezia, dysuria, hematuria, rash, seizure activity, orthopnea, PND, pedal edema, claudication. Remaining systems are negative.  Physical Exam: Well-developed well-nourished in no acute distress.  Skin is warm and dry.  HEENT is normal.  Neck is supple.  Chest is clear to auscultation with normal expansion.  Cardiovascular exam is regular rate and rhythm.  Abdominal exam nontender or distended. No masses palpated. Extremities show trace edema. neuro grossly intact  ECG- Sinus bradycardia with short PR interval and no ST changes; personally reviewed  A/P  1 Palpitations-symptoms are controlled at present. Continue beta blocker.  2 hypertension-blood pressure elevated. Increase norvasc to 10 mg daily and follow.  3 dyspnea-patient has some dyspnea on exertion. Previous echocardiogram showed normal LV function. Nuclear study showed no ischemia. We will not pursue further cardiac evaluation at this point.  Kirk Ruths, MD

## 2016-09-28 ENCOUNTER — Encounter: Payer: Self-pay | Admitting: Cardiology

## 2016-09-28 ENCOUNTER — Ambulatory Visit (INDEPENDENT_AMBULATORY_CARE_PROVIDER_SITE_OTHER): Payer: Medicare Other | Admitting: Cardiology

## 2016-09-28 VITALS — BP 179/82 | HR 50 | Ht 73.0 in | Wt 215.4 lb

## 2016-09-28 DIAGNOSIS — I10 Essential (primary) hypertension: Secondary | ICD-10-CM | POA: Diagnosis not present

## 2016-09-28 DIAGNOSIS — R0609 Other forms of dyspnea: Secondary | ICD-10-CM | POA: Diagnosis not present

## 2016-09-28 DIAGNOSIS — R002 Palpitations: Secondary | ICD-10-CM

## 2016-09-28 MED ORDER — AMLODIPINE BESYLATE 10 MG PO TABS: 10.0000 mg | ORAL_TABLET | Freq: Every day | ORAL | 3 refills | Status: DC

## 2016-09-28 NOTE — Patient Instructions (Signed)
Medication Instructions:   INCREASE AMLODIPINE TO 10 MG ONCE DAILY= 2 OF THE 5 MG TABLETS ONCE DAILY  Follow-Up:  Your physician wants you to follow-up in: [place] will receive a reminder letter in the mail two months in advance. If you don't receive a letter, please call our office to schedule the follow-up appointment.   If you need a refill on your cardiac medications before your next appointment, please call your pharmacy.

## 2016-10-03 ENCOUNTER — Other Ambulatory Visit: Payer: Self-pay | Admitting: Cardiology

## 2016-10-04 NOTE — Telephone Encounter (Signed)
Rx(s) sent to pharmacy electronically.  

## 2016-12-02 ENCOUNTER — Other Ambulatory Visit: Payer: Self-pay | Admitting: Cardiology

## 2016-12-25 ENCOUNTER — Encounter (HOSPITAL_COMMUNITY): Payer: Self-pay | Admitting: Emergency Medicine

## 2016-12-25 ENCOUNTER — Emergency Department (HOSPITAL_COMMUNITY)
Admission: EM | Admit: 2016-12-25 | Discharge: 2016-12-25 | Disposition: A | Discharge status: Home / Self Care | Payer: Medicare Other | Attending: Emergency Medicine | Admitting: Emergency Medicine

## 2016-12-25 DIAGNOSIS — I10 Essential (primary) hypertension: Secondary | ICD-10-CM | POA: Diagnosis not present

## 2016-12-25 DIAGNOSIS — Z91040 Latex allergy status: Secondary | ICD-10-CM | POA: Insufficient documentation

## 2016-12-25 DIAGNOSIS — Y658 Other specified misadventures during surgical and medical care: Secondary | ICD-10-CM | POA: Diagnosis not present

## 2016-12-25 DIAGNOSIS — Z87891 Personal history of nicotine dependence: Secondary | ICD-10-CM | POA: Insufficient documentation

## 2016-12-25 DIAGNOSIS — Z79899 Other long term (current) drug therapy: Secondary | ICD-10-CM | POA: Diagnosis not present

## 2016-12-25 DIAGNOSIS — Z85828 Personal history of other malignant neoplasm of skin: Secondary | ICD-10-CM | POA: Diagnosis not present

## 2016-12-25 DIAGNOSIS — T83098A Other mechanical complication of other indwelling urethral catheter, initial encounter: Secondary | ICD-10-CM | POA: Insufficient documentation

## 2016-12-25 DIAGNOSIS — T83018A Breakdown (mechanical) of other indwelling urethral catheter, initial encounter: Secondary | ICD-10-CM

## 2016-12-25 NOTE — ED Provider Notes (Signed)
[facility] DEPT Provider Note   CSN: [id number] Arrival date & time: 12/25/16  1804     History   Chief Complaint Chief Complaint  Patient presents with  . catheter problem    HPI Richard Vincent. is a 83 y.o. male.  HPI  83 year old male with basal cell carcinoma to the nose status post forehead flap who was catheterized during the surgery and had difficulty voiding afterwards requiring indwelling catheter presents to the ED with Foley catheter dysfunction. Patient reports that for the past several hours the Foley has not been draining any urine. Endorses mild suprapubic distention but no overt pain or discomfort. No alleviating or aggravating factors. No other physical complaints.   Past Medical History:  Diagnosis Date  . Basal cell cancer   . BPH (benign prostatic hyperplasia)   . Gout   . Hypertension   . Renal insufficiency     Patient Active Problem List   Diagnosis Date Noted  . Palpitations 01/04/2012  . Hypertension 01/04/2012  . Dyspnea 01/04/2012    Past Surgical History:  Procedure Laterality Date  . Skin cancer removed    . TONSILLECTOMY    . TRANSURETHRAL RESECTION OF PROSTATE         Home Medications    Prior to Admission medications   Medication Sig Start Date End Date Taking? Authorizing Provider  amLODipine (NORVASC) 5 MG tablet TAKE ONE TABLET BY MOUTH DAILY 12/02/16   Lelon Perla, MD  Cholecalciferol (VITAMIN D-3) 1000 UNITS CAPS Take 1 capsule by mouth daily.    [provider]  FIBER PO Take by mouth daily. 8 grams daily    [provider]  finasteride (PROSCAR) 5 MG tablet Take 5 mg by mouth daily.    [provider]  metoprolol succinate (TOPROL-XL) 50 MG 24 hr tablet Take 1 tablet (50 mg total) by mouth daily. 10/04/16   Lelon Perla, MD  Omega-3 Fatty Acids (FISH OIL) 1200 MG CAPS Take 1 capsule by mouth daily.    [provider]  tamsulosin (FLOMAX) 0.4 MG CAPS capsule Take 2  capsules by mouth daily.  04/29/13   [provider]    Family History Family History  Problem Relation Age of Onset  . Coronary artery disease Unknown        No family history    Social History Social History  Substance Use Topics  . Smoking status: Former Smoker    Quit date: 10/17/1994  . Smokeless tobacco: Never Used  . Alcohol use Yes     Comment: Occasional     Allergies   Latex   Review of Systems Review of Systems All other systems are reviewed and are negative for acute change except as noted in the HPI   Physical Exam Updated Vital Signs BP (!) 144/73 (BP Location: Left Arm)   Pulse 72   Temp 98.4 F (36.9 C) (Oral)   SpO2 100%   Physical Exam  Constitutional: He is oriented to person, place, and time. He appears well-developed and well-nourished. No distress.  HENT:  Head: Normocephalic and atraumatic.    Right Ear: External ear normal.  Left Ear: External ear normal.  Nose: Nose normal.  Mouth/Throat: Mucous membranes are normal. No trismus in the jaw.  Eyes: Conjunctivae and EOM are normal. No scleral icterus.  Neck: Normal range of motion and phonation normal.  Cardiovascular: Normal rate and regular rhythm.   Pulmonary/Chest: Effort normal. No stridor. No respiratory distress.  Abdominal:  He exhibits no distension.  Genitourinary: Uncircumcised. No penile erythema or penile tenderness. No discharge found.  Genitourinary Comments: Indwelling Foley catheter in place not draining urine. Small amount of blood noted in the catheter.  Musculoskeletal: Normal range of motion. He exhibits no edema.  Neurological: He is alert and oriented to person, place, and time.  Skin: He is not diaphoretic.  Psychiatric: He has a normal mood and affect. His behavior is normal.  Vitals reviewed.    ED Treatments / Results  Labs (all labs ordered are listed, but only abnormal results are displayed) Labs Reviewed - No data to display  EKG  EKG  Interpretation None       Radiology No results found.  Procedures Procedures (including critical care time)  Medications Ordered in ED Medications - No data to display   Initial Impression / Assessment and Plan / ED Course  I have reviewed the triage vital signs and the nursing notes.  Pertinent labs & imaging results that were available during my care of the patient were reviewed by me and considered in my medical decision making (see chart for details).      Will flush catheter.  Able to flush the catheter. Obtained good return with clear urine. Distention improved. Patient scheduled for urology in 2 days. Instructed to keep that appointment.  The patient is safe for discharge with strict return precautions.    Final Clinical Impressions(s) / ED Diagnoses   Final diagnoses:  Urinary catheter dysfunction, initial encounter [facility])   Disposition: Discharge  Condition: Good  I have discussed the results, Dx and Tx plan with the patient who expressed understanding and agree(s) with the plan. Discharge instructions discussed at great length. The patient was given strict return precautions who verbalized understanding of the instructions. No further questions at time of discharge.    New Prescriptions   No medications on file    Follow Up: Urology  On 12/27/2016 as scheduled      Cardama, Grayce Sessions, MD 12/25/16 2142

## 2016-12-25 NOTE — ED Notes (Signed)
Foley was flushed with Sterile water. Patient had a clot but urine cleared up. Patient had out 700 CC of urine.

## 2016-12-25 NOTE — ED Triage Notes (Signed)
Pt from home with complaints of urinary retention to his catheter starting around noon today. Pt rates his pain around 2/10. Pt states he has had his catheter in for about 1 week since he had a surgery to remove basal cell cancer

## 2016-12-26 ENCOUNTER — Emergency Department (HOSPITAL_COMMUNITY)
Admission: EM | Admit: 2016-12-26 | Discharge: 2016-12-26 | Disposition: A | Discharge status: Home / Self Care | Payer: Medicare Other | Attending: Emergency Medicine | Admitting: Emergency Medicine

## 2016-12-26 ENCOUNTER — Encounter (HOSPITAL_COMMUNITY): Payer: Self-pay

## 2016-12-26 DIAGNOSIS — Z87891 Personal history of nicotine dependence: Secondary | ICD-10-CM | POA: Insufficient documentation

## 2016-12-26 DIAGNOSIS — I10 Essential (primary) hypertension: Secondary | ICD-10-CM | POA: Insufficient documentation

## 2016-12-26 DIAGNOSIS — T83028A Displacement of other indwelling urethral catheter, initial encounter: Secondary | ICD-10-CM | POA: Diagnosis not present

## 2016-12-26 DIAGNOSIS — R339 Retention of urine, unspecified: Secondary | ICD-10-CM | POA: Diagnosis present

## 2016-12-26 DIAGNOSIS — Z79899 Other long term (current) drug therapy: Secondary | ICD-10-CM | POA: Diagnosis not present

## 2016-12-26 DIAGNOSIS — Y732 Prosthetic and other implants, materials and accessory gastroenterology and urology devices associated with adverse incidents: Secondary | ICD-10-CM | POA: Diagnosis not present

## 2016-12-26 DIAGNOSIS — T839XXA Unspecified complication of genitourinary prosthetic device, implant and graft, initial encounter: Secondary | ICD-10-CM

## 2016-12-26 NOTE — ED Notes (Signed)
Patient states he started to feel pressure in his lower abdominal area. Patients foley was reposition and urine started coming through the foley. 10 cc of sterile water was placed in the balloon. Patient states he felt relief. MD notified.

## 2016-12-26 NOTE — ED Notes (Signed)
Bed: WA07 Expected date:  Expected time:  Means of arrival:  Comments: 

## 2016-12-26 NOTE — ED Provider Notes (Signed)
[facility] DEPT Provider Note   CSN: [id number] Arrival date & time: 12/26/16  0122  By signing my name below, I, Ny'Kea Lewis, attest that this documentation has been prepared under the direction and in the presence of Sherwood Gambler, MD. Electronically Signed: Lise Auer, ED Scribe. 12/26/16. 3:09 AM.  History   Chief Complaint Chief Complaint  Patient presents with  . Urinary Retention   HPI  HPI Comments: Richard Vincent. is a 83 y.o. male who presents to the Emergency Department complaining of sudden onset urinary retention that began prior to arrival. Pt notes associated suprapubic pressure and lower back pain. He was seen earlier in the ED for the same issue but upon discharge he was voiding without difficulty. After returning home, eating dinner, and going to bed he reports he was away from from his sleep with suprapubic pressure and he was unable to void again. After the nurse manipulate the foley his symptoms have resolved. Denies penile pain or any other acute associated symptoms at this time.   Past Medical History:  Diagnosis Date  . Basal cell cancer   . BPH (benign prostatic hyperplasia)   . Gout   . Hypertension   . Renal insufficiency     Patient Active Problem List   Diagnosis Date Noted  . Palpitations 01/04/2012  . Hypertension 01/04/2012  . Dyspnea 01/04/2012    Past Surgical History:  Procedure Laterality Date  . Skin cancer removed    . TONSILLECTOMY    . TRANSURETHRAL RESECTION OF PROSTATE      Home Medications    Prior to Admission medications   Medication Sig Start Date End Date Taking? Authorizing Provider  amLODipine (NORVASC) 5 MG tablet TAKE ONE TABLET BY MOUTH DAILY Patient taking differently: TAKE 5 MG BY MOUTH AT BEDTIME 12/02/16  Yes Lelon Perla, MD  Cholecalciferol (VITAMIN D-3) 1000 UNITS CAPS Take 1,000 Units by mouth daily.    Yes [provider]  FIBER PO Take 8 g by mouth daily.    Yes [provider]  finasteride (PROSCAR) 5 MG tablet Take 5 mg by mouth daily.   Yes [provider]  metoprolol succinate (TOPROL-XL) 50 MG 24 hr tablet Take 1 tablet (50 mg total) by mouth daily. Patient taking differently: Take 50 mg by mouth at bedtime.  10/04/16  Yes Lelon Perla, MD  Omega-3 Fatty Acids (FISH OIL) 1200 MG CAPS Take 1,200 mg by mouth daily.    Yes [provider]  tamsulosin (FLOMAX) 0.4 MG CAPS capsule Take 0.8 mg by mouth daily.  04/29/13  Yes [provider]    Family History Family History  Problem Relation Age of Onset  . Coronary artery disease Unknown        No family history   Social History Social History  Substance Use Topics  . Smoking status: Former Smoker    Quit date: 10/17/1994  . Smokeless tobacco: Never Used  . Alcohol use Yes     Comment: Occasional   Allergies   Latex  Review of Systems Review of Systems  Gastrointestinal: Positive for abdominal pain. Negative for abdominal distention.  Genitourinary: Negative for penile pain.  Musculoskeletal: Positive for back pain.  All other systems reviewed and are negative.  Physical Exam Updated Vital Signs BP (!) 155/67 (BP Location: Left Arm)   Pulse 68   Temp 98.8 F (37.1 C) (Oral)   Resp 18   Ht 6\' 1"  (1.854 m)   Wt  202 lb 6.4 oz (91.8 kg)   SpO2 99%   BMI 26.70 kg/m   Physical Exam  Constitutional: He is oriented to person, place, and time. He appears well-developed and well-nourished.  HENT:  Head: Normocephalic.  Right Ear: External ear normal.  Left Ear: External ear normal.  Nose: Nose normal.  Wounds over nose and forehead  Eyes: Right eye exhibits no discharge. Left eye exhibits no discharge.  Neck: Neck supple.  Cardiovascular: Normal rate, regular rhythm and normal heart sounds.   Pulmonary/Chest: Effort normal and breath sounds normal.  Abdominal: Soft. He exhibits no distension. There is no tenderness.  Genitourinary:  Genitourinary  Comments: Normal penis with foley catheter in place.   Musculoskeletal: He exhibits no edema.  Neurological: He is alert and oriented to person, place, and time.  Skin: Skin is warm and dry.  Nursing note and vitals reviewed.  ED Treatments / Results  DIAGNOSTIC STUDIES: Oxygen Saturation is 99% on RA, normal by my interpretation.   COORDINATION OF CARE: 3:04 AM-Discussed next steps with pt. Pt verbalized understanding and is agreeable with the plan.   Labs (all labs ordered are listed, but only abnormal results are displayed) Labs Reviewed - No data to display  EKG  EKG Interpretation None       Radiology No results found.  Procedures Procedures (including critical care time)  Medications Ordered in ED Medications - No data to display   Initial Impression / Assessment and Plan / ED Course  I have reviewed the triage vital signs and the nursing notes.  Pertinent labs & imaging results that were available during my care of the patient were reviewed by me and considered in my medical decision making (see chart for details).     The nurse notes that the Foley catheter seemed to be partially dislodging. She deflated the balloon which was not all the way filled and then reinserted the Foley catheter. Filled the balloon with 10 mL. Now, the Foley catheter is flowing freely. Patient's symptoms are completely gone. Likely this was Foley catheter dysfunction. There is pink urine but no clear blood clots. Have him follow-up with urology as previously scheduled. His abdominal and back pressure or gone now that the retention is treated. No fevers or vomiting. Discussed return precautions.  Final Clinical Impressions(s) / ED Diagnoses   Final diagnoses:  Complication of Foley catheter, initial encounter [facility])    New Prescriptions New Prescriptions   No medications on file   I personally performed the services described in this documentation, which was scribed in my presence.  The recorded information has been reviewed and is accurate.     Sherwood Gambler, MD 12/26/16 [phone]

## 2016-12-26 NOTE — ED Triage Notes (Signed)
States just left here a couple of hours ago for urinary retention and had foley catheter flushed and went home and now back for same thing no acute distress noted.

## 2016-12-26 NOTE — ED Notes (Addendum)
Pt's catheter not draining. He was seen here yesterday, the catheter was drained, blood clot came out, and sent home. Pt c/o minor pressure in the lower abd.

## 2016-12-26 NOTE — ED Notes (Signed)
Bed: WTR7 Expected date:  Expected time:  Means of arrival:  Comments: 

## 2017-01-23 NOTE — Progress Notes (Signed)
HPI: FU palpitations. CardioNet in July of 2013 showed sinus bradycardia with occasional PAC. TSH 2.034. 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 Echocardiogram November 2016 showed normal LV function, grade 1 diastolic dysfunction and moderate left atrial enlargement. Nuclear study December 2016 showed ejection fraction 53%, prior infarct but no ischemia. Since I last saw him, patient had resection of melanoma from his nose with flap reconstruction. This was in June. He had repeat surgery yesterday at [facility] and was found to be in atrial fibrillation following the procedure. He does note mild dyspnea on exertion but no orthopnea, PND, pedal edema, palpitations, syncope or exertional chest pain.  Current Outpatient Prescriptions  Medication Sig Dispense Refill  . amLODipine (NORVASC) 5 MG tablet TAKE ONE TABLET BY MOUTH DAILY (Patient taking differently: TAKE 5 MG BY MOUTH AT BEDTIME) 90 tablet 2  . Cholecalciferol (VITAMIN D-3) 1000 UNITS CAPS Take 1,000 Units by mouth daily.     Marland Kitchen FIBER PO Take 8 g by mouth daily.     . finasteride (PROSCAR) 5 MG tablet Take 5 mg by mouth daily.    . metoprolol succinate (TOPROL-XL) 50 MG 24 hr tablet Take 1 tablet (50 mg total) by mouth daily. (Patient taking differently: Take 50 mg by mouth at bedtime. ) 30 tablet 9  . Omega-3 Fatty Acids (FISH OIL) 1200 MG CAPS Take 1,200 mg by mouth daily.     . tamsulosin (FLOMAX) 0.4 MG CAPS capsule Take 0.8 mg by mouth daily.      No current facility-administered medications for this visit.      Past Medical History:  Diagnosis Date  . Basal cell cancer   . BPH (benign prostatic hyperplasia)   . Gout   . Hypertension   . Renal insufficiency     Past Surgical History:  Procedure Laterality Date  . Skin cancer removed    . TONSILLECTOMY    . TRANSURETHRAL RESECTION OF PROSTATE      Social History   Social  History  . Marital status: Married    Spouse name: N/A  . Number of children: 6  . 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 Smoking status: Former Smoker    Quit date: 10/17/1994  . Smokeless tobacco: Never Used  . Alcohol use Yes     Comment: Occasional  . Drug use: Unknown  . Sexual activity: Not on file   Other Topics Concern  . Not on file   Social History Narrative  . No narrative on file    Family History  Problem Relation Age of Onset  . Coronary artery disease Unknown        No family history    ROS: no fevers or chills, productive cough, hemoptysis, dysphasia, odynophagia, melena, hematochezia, dysuria, hematuria, rash, seizure activity, orthopnea, PND, pedal edema, claudication. Remaining systems are negative.  Physical Exam: Well-developed well-nourished in no acute distress.  Skin is warm and dry.  HEENT Incisions noted over nose from recent surgery. Neck is supple.  Chest is clear to auscultation with normal expansion.  Cardiovascular exam is irregular Abdominal exam nontender or distended. No masses palpated. Extremities show no edema. neuro grossly intact  A/P  1 Hypertension-blood pressure is controlled. Continue present medications.  2 palpitations-Continue beta blocker.  3 dyspnea-mild increase since previous office visit may  be secondary to atrial fibrillation. Plan as outlined under #4.  4 persistent atrial fibrillation-patient was noted to be in atrial fibrillation following surgery yesterday. He has mild dyspnea on exertion but is otherwise asymptomatic and therefore duration of arrhythmia is unclear. It may be postoperative atrial fibrillation from recent facial surgery/melanoma resection. CHADSvasc 3. Patient was given a prescription for xarelto 20 mg daily. I will continue for now. However he has had renal insufficiency in the past. Recheck renal function today and decrease dose to 15 mg  if needed. His heart rate is controlled. Continue Toprol. Check echocardiogram and TSH. I will see him back in 6-8 weeks and if atrial fibrillation persists we will likely proceed with cardioversion to see if he will hold sinus rhythm.   Kirk Ruths, MD

## 2017-01-24 ENCOUNTER — Encounter: Payer: Self-pay | Admitting: Cardiology

## 2017-01-24 ENCOUNTER — Ambulatory Visit (INDEPENDENT_AMBULATORY_CARE_PROVIDER_SITE_OTHER): Payer: Medicare Other | Admitting: Cardiology

## 2017-01-24 VITALS — BP 130/72 | HR 70 | Ht 73.0 in | Wt 192.0 lb

## 2017-01-24 DIAGNOSIS — I4819 Other persistent atrial fibrillation: Secondary | ICD-10-CM

## 2017-01-24 DIAGNOSIS — I481 Persistent atrial fibrillation: Secondary | ICD-10-CM | POA: Diagnosis not present

## 2017-01-24 DIAGNOSIS — I10 Essential (primary) hypertension: Secondary | ICD-10-CM

## 2017-01-24 DIAGNOSIS — R0609 Other forms of dyspnea: Secondary | ICD-10-CM

## 2017-01-24 LAB — BASIC METABOLIC PANEL
BUN/Creatinine Ratio: 12 (ref 10–24)
BUN: 24 mg/dL (ref 8–27)
CO2: 22 mmol/L (ref 20–29)
Calcium: 9 mg/dL (ref 8.6–10.2)
Chloride: 106 mmol/L (ref 96–106)
Creatinine, Ser: 2 mg/dL — ABNORMAL HIGH (ref 0.76–1.27)
GFR calc Af Amer: 35 mL/min/{1.73_m2} — ABNORMAL LOW (ref >59)
GFR, EST NON AFRICAN AMERICAN: 30 mL/min/{1.73_m2} — AB (ref >59)
GLUCOSE: 89 mg/dL (ref 65–99)
POTASSIUM: 5.3 mmol/L — AB (ref 3.5–5.2)
SODIUM: 143 mmol/L (ref 134–144)

## 2017-01-24 LAB — CBC
Hematocrit: 39 % (ref 37.5–51.0)
Hemoglobin: 12.6 g/dL — ABNORMAL LOW (ref 13.0–17.7)
MCH: 29 pg (ref 26.6–33.0)
MCHC: 32.3 g/dL (ref 31.5–35.7)
MCV: 90 fL (ref 79–97)
Platelets: 275 10*3/uL (ref 150–379)
RBC: 4.35 x10E6/uL (ref 4.14–5.80)
RDW: 12.7 % (ref 12.3–15.4)
WBC: 7.2 10*3/uL (ref 3.4–10.8)

## 2017-01-24 LAB — TSH: TSH: 2 u[IU]/mL (ref 0.450–4.500)

## 2017-01-24 NOTE — Patient Instructions (Signed)
Medication Instructions:   NO CHANGE  Labwork:  Your physician recommends that you HAVE LAB WORK TODAY  Testing/Procedures:  Your physician has requested that you have an echocardiogram. Echocardiography is a painless test that uses sound waves to create images of your heart. It provides your doctor with information about the size and shape of your heart and how well your heart's chambers and valves are working. This procedure takes approximately one hour. There are no restrictions for this procedure.    Follow-Up:  Your physician recommends that you schedule a follow-up appointment in: [place]

## 2017-01-25 ENCOUNTER — Telehealth: Payer: Self-pay | Admitting: *Deleted

## 2017-01-25 MED ORDER — RIVAROXABAN 15 MG PO TABS: 15.0000 mg | ORAL_TABLET | Freq: Every day | ORAL | 3 refills | Status: DC

## 2017-01-25 MED ORDER — AMLODIPINE BESYLATE 10 MG PO TABS: 10.0000 mg | ORAL_TABLET | Freq: Every day | ORAL | Status: DC

## 2017-01-25 NOTE — Telephone Encounter (Signed)
Spoke with pt, aware of xarelto change. Cautioned on high potassium foods to avoid.

## 2017-01-25 NOTE — Telephone Encounter (Signed)
-----   Message from Lelon Perla, MD sent at 01/24/2017  5:01 PM EDT ----- Change xarelto to 15 mg daily Kirk Ruths

## 2017-02-01 ENCOUNTER — Other Ambulatory Visit: Payer: Self-pay

## 2017-02-01 ENCOUNTER — Ambulatory Visit (HOSPITAL_COMMUNITY): Payer: Medicare Other | Attending: Cardiology

## 2017-02-01 DIAGNOSIS — I481 Persistent atrial fibrillation: Secondary | ICD-10-CM

## 2017-02-01 DIAGNOSIS — I5030 Unspecified diastolic (congestive) heart failure: Secondary | ICD-10-CM | POA: Insufficient documentation

## 2017-02-01 DIAGNOSIS — I420 Dilated cardiomyopathy: Secondary | ICD-10-CM | POA: Insufficient documentation

## 2017-02-01 DIAGNOSIS — I081 Rheumatic disorders of both mitral and tricuspid valves: Secondary | ICD-10-CM | POA: Diagnosis not present

## 2017-02-01 DIAGNOSIS — I4819 Other persistent atrial fibrillation: Secondary | ICD-10-CM

## 2017-03-16 ENCOUNTER — Encounter (HOSPITAL_BASED_OUTPATIENT_CLINIC_OR_DEPARTMENT_OTHER): Payer: Self-pay

## 2017-03-16 ENCOUNTER — Emergency Department (HOSPITAL_BASED_OUTPATIENT_CLINIC_OR_DEPARTMENT_OTHER)
Admission: EM | Admit: 2017-03-16 | Discharge: 2017-03-16 | Disposition: A | Discharge status: Home / Self Care | Payer: Medicare Other | Attending: Emergency Medicine | Admitting: Emergency Medicine

## 2017-03-16 DIAGNOSIS — N3001 Acute cystitis with hematuria: Secondary | ICD-10-CM | POA: Diagnosis not present

## 2017-03-16 DIAGNOSIS — I129 Hypertensive chronic kidney disease with stage 1 through stage 4 chronic kidney disease, or unspecified chronic kidney disease: Secondary | ICD-10-CM | POA: Insufficient documentation

## 2017-03-16 DIAGNOSIS — R338 Other retention of urine: Secondary | ICD-10-CM | POA: Diagnosis not present

## 2017-03-16 DIAGNOSIS — N189 Chronic kidney disease, unspecified: Secondary | ICD-10-CM | POA: Diagnosis not present

## 2017-03-16 DIAGNOSIS — Z436 Encounter for attention to other artificial openings of urinary tract: Secondary | ICD-10-CM | POA: Diagnosis present

## 2017-03-16 DIAGNOSIS — Z87891 Personal history of nicotine dependence: Secondary | ICD-10-CM | POA: Insufficient documentation

## 2017-03-16 DIAGNOSIS — Z85828 Personal history of other malignant neoplasm of skin: Secondary | ICD-10-CM | POA: Insufficient documentation

## 2017-03-16 LAB — URINALYSIS, MICROSCOPIC (REFLEX)

## 2017-03-16 LAB — URINALYSIS, ROUTINE W REFLEX MICROSCOPIC
BILIRUBIN URINE: NEGATIVE
GLUCOSE, UA: NEGATIVE mg/dL
KETONES UR: NEGATIVE mg/dL
Nitrite: POSITIVE — AB
PH: 6 (ref 5.0–8.0)
PROTEIN: 30 mg/dL — AB
Specific Gravity, Urine: 1.01 (ref 1.005–1.030)

## 2017-03-16 MED ORDER — CEPHALEXIN 500 MG PO CAPS: 500.0000 mg | ORAL_CAPSULE | Freq: Four times a day (QID) | ORAL | 0 refills | Status: DC

## 2017-03-16 MED ORDER — CEPHALEXIN 250 MG PO CAPS
500.0000 mg | ORAL_CAPSULE | Freq: Once | ORAL | Status: AC
  Administered 2017-03-16: 500 mg via ORAL
  Filled 2017-03-16: qty 2

## 2017-03-16 NOTE — ED Notes (Signed)
Attempted to flush  Fluid would go in but no return

## 2017-03-16 NOTE — ED Triage Notes (Signed)
Pt states his foley cath was changed at urology office Tuesday-noticed it was not draining approx 1 hour PTA-NAD-steady gait

## 2017-03-16 NOTE — ED Notes (Signed)
75 cc urine emptied

## 2017-03-16 NOTE — ED Notes (Signed)
ED Provider at bedside. 

## 2017-03-16 NOTE — ED Notes (Signed)
Deflated foley balloon and cathter started draining  Emptied 525 cc urine,  Will wait to see if foley continues to flow

## 2017-03-16 NOTE — ED Notes (Signed)
C/o foley not draining x 1 day   Foley was inserted on tuesday

## 2017-03-16 NOTE — ED Provider Notes (Signed)
Emergency Department Provider Note   I have reviewed the triage vital signs and the nursing notes.   HISTORY  Chief Complaint Other (Foley cath not draining)   HPI Richard Vincent. is a 83 y.o. male who has an indwelling catheter for a while secondary to a post surgery issue. The last 4-5 hours he has had no output. Even though he had the catheter changed couple days ago.No other symptoms aside from suprapubic pain secondary to the retention. No back pain.   Past Medical History:  Diagnosis Date  . Basal cell cancer   . BPH (benign prostatic hyperplasia)   . Gout   . Hypertension   . Renal insufficiency     Patient Active Problem List   Diagnosis Date Noted  . Palpitations 01/04/2012  . Hypertension 01/04/2012  . Dyspnea 01/04/2012    Past Surgical History:  Procedure Laterality Date  . NOSE SURGERY    . Skin cancer removed    . TONSILLECTOMY    . TRANSURETHRAL RESECTION OF PROSTATE      Current Outpatient Rx  . Order #: [id number] Class: No Print  . Order #: [id number] Class: Print  . Order #: [id number] Class: Historical Med  . Order #: [id number] Class: Historical Med  . Order #: [id number] Class: Historical Med  . Order #: [id number] Class: Normal  . Order #: [id number] Class: Historical Med  . Order #: [id number] Class: Normal  . Order #: [id number] Class: Historical Med    Allergies Latex  Family History  Problem Relation Age of Onset  . Coronary artery disease Unknown        No family history    Social History Social History  Substance Use Topics  . Smoking status: Former Smoker    Quit date: 10/17/1994  . Smokeless tobacco: Never Used  . Alcohol use Yes     Comment: Occasional    Review of Systems  All other systems negative except as documented in the HPI. All pertinent positives and negatives as reviewed in the HPI.  ____________________________________________   PHYSICAL EXAM:  VITAL SIGNS: ED Triage Vitals  Enc Vitals Group     BP  03/16/17 1844 (!) 152/76     Pulse Rate 03/16/17 1844 79     Resp 03/16/17 1844 18     Temp 03/16/17 1844 99 F (37.2 C)     Temp Source 03/16/17 1844 Oral     SpO2 03/16/17 1844 97 %     Weight 03/16/17 1845 189 lb (85.7 kg)     Height 03/16/17 1845 6\' 1"  (1.854 m)     Head Circumference --      Peak Flow --      Pain Score 03/16/17 1842 3     Pain Loc --      Pain Edu? --      Excl. in [place]? --     Constitutional: Alert and oriented. Well appearing and in no acute distress. Eyes: Conjunctivae are normal. PERRL. EOMI. Head: Atraumatic. Nose: No congestion/rhinnorhea. Mouth/Throat: Mucous membranes are moist.  Oropharynx non-erythematous. Neck: No stridor.  No meningeal signs.   Cardiovascular: Normal rate, regular rhythm. Good peripheral circulation. Grossly normal heart sounds.   Respiratory: Normal respiratory effort.  No retractions. Lungs CTAB. Gastrointestinal: Soft and tender to suprapubic area. No distention.  Musculoskeletal: No lower extremity tenderness nor edema. No gross deformities of extremities. Neurologic:  Normal speech and language. No gross focal neurologic deficits are appreciated.  Skin:  Skin is warm, dry and intact. No rash noted.  ____________________________________________   LABS (all labs ordered are listed, but only abnormal results are displayed)  Labs Reviewed  URINALYSIS, ROUTINE W REFLEX MICROSCOPIC - Abnormal; Notable for the following:       Result Value   APPearance CLOUDY (*)    Hgb urine dipstick LARGE (*)    Protein, ur 30 (*)    Nitrite POSITIVE (*)    Leukocytes, UA LARGE (*)    All other components within normal limits  URINALYSIS, MICROSCOPIC (REFLEX) - Abnormal; Notable for the following:    Bacteria, UA FEW (*)    Squamous Epithelial / LPF 6-30 (*)    All other components within normal limits  URINE CULTURE  ____________________________________________  PROCEDURES  Procedure(s) performed:    Procedures  ____________________________________________   INITIAL IMPRESSION / ASSESSMENT AND PLAN / ED COURSE  Pertinent labs & imaging results that were available during my care of the patient were reviewed by me and considered in my medical decision making (see chart for details).  We'll attempt to flush Foley. The urine as it is bag does appear very cloudy so we'll check a urinalysis as well as a sediment could be causing a obstruction of the catheter.  Catheter started working. UA w/ e/o infection, will treat. FU w/ PCP to ensure improvement.  ____________________________________________  FINAL CLINICAL IMPRESSION(S) / ED DIAGNOSES  Final diagnoses:  Acute cystitis with hematuria     MEDICATIONS GIVEN DURING THIS VISIT:  Medications  cephALEXin (KEFLEX) capsule 500 mg (500 mg Oral Given 03/16/17 2130)     NEW OUTPATIENT MEDICATIONS STARTED DURING THIS VISIT:  Discharge Medication List as of 03/16/2017  9:29 PM    START taking these medications   Details  cephALEXin (KEFLEX) 500 MG capsule Take 1 capsule (500 mg total) by mouth 4 (four) times daily., Starting Thu 03/16/2017, Print        Note:  This document was prepared using Dragon voice recognition software and may include unintentional dictation errors.    Merrily Pew, MD 03/16/17 [phone]

## 2017-03-19 LAB — URINE CULTURE

## 2017-03-20 ENCOUNTER — Telehealth: Payer: Self-pay | Admitting: *Deleted

## 2017-03-20 NOTE — Telephone Encounter (Signed)
Post ED Visit - Positive Culture Follow-up  Culture report reviewed by antimicrobial stewardship pharmacist:  []  Elenor Quinones, Pharm.D. []  Heide Guile, Pharm.D., BCPS AQ-ID []  Parks Neptune, Pharm.D., BCPS []  Alycia Rossetti, Pharm.D., BCPS []  Kiel, [place].D., BCPS, AAHIVP []  Legrand Como, Pharm.D., BCPS, AAHIVP []  Salome Arnt, PharmD, BCPS []  Dimitri Ped, PharmD, BCPS []  Vincenza Hews, PharmD, BCPS   Positive urine culture, reviewed by Melina Schools, PA-C.  Patient states he is asymptomatic at present and has followed up with urology.  No further treatment needed at this time.  Harlon Flor [facility] 03/20/2017, 10:35 AM

## 2017-03-20 NOTE — Progress Notes (Signed)
ED Antimicrobial Stewardship Positive Culture Follow Up   Richard Vincent. is an 83 y.o. male who presented to [facility] on 03/16/2017 with a chief complaint of  Chief Complaint  Patient presents with  . Other    Foley cath not draining    Recent Results (from the past 720 hour(s))  Urine culture     Status: Abnormal   Collection Time: 03/16/17  8:48 PM  Result Value Ref Range Status   Specimen Description URINE, RANDOM  Final   Special Requests NONE  Final   Culture >=100,000 COLONIES/mL PSEUDOMONAS AERUGINOSA (A)  Final   Report Status 03/19/2017 FINAL  Final   Organism ID, Bacteria PSEUDOMONAS AERUGINOSA (A)  Final      Susceptibility   Pseudomonas aeruginosa - MIC*    CEFTAZIDIME 4 SENSITIVE Sensitive     CIPROFLOXACIN <=0.25 SENSITIVE Sensitive     GENTAMICIN <=1 SENSITIVE Sensitive     IMIPENEM 2 SENSITIVE Sensitive     PIP/TAZO 8 SENSITIVE Sensitive     CEFEPIME 2 SENSITIVE Sensitive     * >=100,000 COLONIES/mL PSEUDOMONAS AERUGINOSA    [x]  Treated with cephalexin, organism resistant to prescribed antimicrobial []  Patient discharged originally without antimicrobial agent and treatment is now indicated  New antibiotic prescription:  If symptomatic , Ciprofloxacin 500 mg every 12 hours for 5 days.  ED Provider: John Giovanni, PA-C   Susa Raring, PharmD 03/20/2017, 9:50 AM Infectious Diseases Pharmacy Resident  Phone# [phone]

## 2017-03-21 ENCOUNTER — Emergency Department (HOSPITAL_BASED_OUTPATIENT_CLINIC_OR_DEPARTMENT_OTHER)
Admission: EM | Admit: 2017-03-21 | Discharge: 2017-03-21 | Disposition: A | Discharge status: Home / Self Care | Payer: Medicare Other | Attending: Emergency Medicine | Admitting: Emergency Medicine

## 2017-03-21 ENCOUNTER — Encounter (HOSPITAL_BASED_OUTPATIENT_CLINIC_OR_DEPARTMENT_OTHER): Payer: Self-pay

## 2017-03-21 DIAGNOSIS — Z87891 Personal history of nicotine dependence: Secondary | ICD-10-CM | POA: Diagnosis not present

## 2017-03-21 DIAGNOSIS — Z79899 Other long term (current) drug therapy: Secondary | ICD-10-CM | POA: Insufficient documentation

## 2017-03-21 DIAGNOSIS — T839XXA Unspecified complication of genitourinary prosthetic device, implant and graft, initial encounter: Secondary | ICD-10-CM | POA: Insufficient documentation

## 2017-03-21 DIAGNOSIS — Z85828 Personal history of other malignant neoplasm of skin: Secondary | ICD-10-CM | POA: Diagnosis not present

## 2017-03-21 DIAGNOSIS — Y658 Other specified misadventures during surgical and medical care: Secondary | ICD-10-CM | POA: Insufficient documentation

## 2017-03-21 DIAGNOSIS — I10 Essential (primary) hypertension: Secondary | ICD-10-CM | POA: Diagnosis not present

## 2017-03-21 DIAGNOSIS — T839XXD Unspecified complication of genitourinary prosthetic device, implant and graft, subsequent encounter: Secondary | ICD-10-CM

## 2017-03-21 NOTE — ED Triage Notes (Signed)
Pt states hid foley stopped draining approx 3 hours PTA-seen for same last week-NAD-steady gait

## 2017-03-21 NOTE — ED Notes (Signed)
Foley cath balloon deflated and foley cath advanced forward  balloon re inflated and 500cc urine returned

## 2017-03-21 NOTE — ED Provider Notes (Signed)
[facility] Provider Note   CSN: [id number] Arrival date & time: 03/21/17  1704     History   Chief Complaint Chief Complaint  Patient presents with  . Foley not draining    HPI Richard Barro. is a 83 y.o. male who presents to the emergency department with a chief complaint of Foley catheter not draining that began at approximately 3 PM this afternoon. He denies hematuria, dysuria, fever, chills. No treatment prior to arrival. He reports his Foley was last replaced last week. He is followed by [facility].   The history is provided by the patient. No language interpreter was used.    Past Medical History:  Diagnosis Date  . Basal cell cancer   . BPH (benign prostatic hyperplasia)   . Gout   . Hypertension   . Renal insufficiency     Patient Active Problem List   Diagnosis Date Noted  . Palpitations 01/04/2012  . Hypertension 01/04/2012  . Dyspnea 01/04/2012    Past Surgical History:  Procedure Laterality Date  . NOSE SURGERY    . Skin cancer removed    . TONSILLECTOMY    . TRANSURETHRAL RESECTION OF PROSTATE         Home Medications    Prior to Admission medications   Medication Sig Start Date End Date Taking? Authorizing Provider  amLODipine (NORVASC) 10 MG tablet Take 1 tablet (10 mg total) by mouth daily. 01/25/17   Lelon Perla, MD  cephALEXin (KEFLEX) 500 MG capsule Take 1 capsule (500 mg total) by mouth 4 (four) times daily. 03/16/17   Mesner, Corene Cornea, MD  Cholecalciferol (VITAMIN D-3) 1000 UNITS CAPS Take 1,000 Units by mouth daily.     [provider]  FIBER PO Take 8 g by mouth daily.     [provider]  finasteride (PROSCAR) 5 MG tablet Take 5 mg by mouth daily.    [provider]  metoprolol succinate (TOPROL-XL) 50 MG 24 hr tablet Take 1 tablet (50 mg total) by mouth daily. Patient taking differently: Take 50 mg by mouth at bedtime.  10/04/16   Lelon Perla, MD  Omega-3 Fatty Acids (FISH  OIL) 1200 MG CAPS Take 1,200 mg by mouth daily.     [provider]  Rivaroxaban (XARELTO) 15 MG TABS tablet Take 1 tablet (15 mg total) by mouth daily with supper. 01/25/17   Lelon Perla, MD  tamsulosin (FLOMAX) 0.4 MG CAPS capsule Take 0.8 mg by mouth daily.  04/29/13   [provider]    Family History Family History  Problem Relation Age of Onset  . Coronary artery disease Unknown        No family history    Social History Social History  Substance Use Topics  . Smoking status: Former Smoker    Quit date: 10/17/1994  . Smokeless tobacco: Never Used  . Alcohol use Yes     Comment: Occasional     Allergies   Latex   Review of Systems Review of Systems  Constitutional: Negative for activity change.  Respiratory: Negative for shortness of breath.   Cardiovascular: Negative for chest pain.  Gastrointestinal: Negative for abdominal pain.  Genitourinary: Negative for dysuria, frequency and hematuria.  Musculoskeletal: Negative for back pain.  Skin: Negative for rash.   Physical Exam Updated Vital Signs BP 139/60   Pulse 61   Temp 97.7 F (36.5 C) (Oral)   Resp 18   Ht 6\' 1"  (1.854 m)  Wt 85.7 kg (189 lb)   SpO2 100%   BMI 24.94 kg/m   Physical Exam  Constitutional: He appears well-developed.  HENT:  Head: Normocephalic.  Eyes: Conjunctivae are normal.  Neck: Neck supple.  Cardiovascular: Normal rate and regular rhythm.   No murmur heard. Pulmonary/Chest: Effort normal.  Abdominal: Soft. He exhibits no distension.  Genitourinary:  Genitourinary Comments: Foley catheter in place.  Normal penis. No external trauma.  Neurological: He is alert.  Skin: Skin is warm and dry.  Psychiatric: His behavior is normal.  Nursing note and vitals reviewed.  ED Treatments / Results  Labs (all labs ordered are listed, but only abnormal results are displayed) Labs Reviewed - No data to display  EKG  EKG Interpretation None        Radiology No results found.  Procedures Procedures (including critical care time)  Medications Ordered in ED Medications - No data to display   Initial Impression / Assessment and Plan / ED Course  I have reviewed the triage vital signs and the nursing notes.  Pertinent labs & imaging results that were available during my care of the patient were reviewed by me and considered in my medical decision making (see chart for details).     83 year old male presenting to the emergency department with chief complaint of foley catheter problem. The balloon was reinflated and the line was successfully irrigated. The patient has no complaints of pain at this time. We'll discharge the patient home to follow up with urology outpatient. Strict return precautions given. No acute distress. The patient is safe for discharge at this time.  Final Clinical Impressions(s) / ED Diagnoses   Final diagnoses:  Foley catheter problem, subsequent encounter    New Prescriptions Discharge Medication List as of 03/21/2017  9:22 PM       Joanne Gavel, PA-C 03/22/17 0329    Tegeler, Gwenyth Allegra, MD 03/22/17 1029

## 2017-03-21 NOTE — Discharge Instructions (Signed)
Please return to the emergency department, if you develop severe pain, blood in your urine, or if you have any problems with the Foley catheter not draining.

## 2017-03-21 NOTE — ED Notes (Signed)
Foley cath is drain well, straw in color.  Denies pain.

## 2017-04-03 NOTE — Progress Notes (Signed)
HPI: FU palpitations and atrial fibrillation. CardioNet in July of 2013 showed sinus bradycardia with occasional PAC. TSH 2.034. 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 Nuclear study December 2016 showed ejection fraction 53%, prior infarct but no ischemia. Patient found to be in atrial fibrillation following surgical procedure in July at [facility]. Echocardiogram July 2018 showed normal LV function, grade 2 diastolic dysfunction, mild mitral regurgitation and mild left atrial enlargement. Plan was to proceed with cardioversion if atrial fibrillation persists as this may have been postoperative atrial fibrillation. Since I last saw him, the patient has dyspnea with more extreme activities but not with routine activities. It is relieved with rest. It is not associated with chest pain. There is no orthopnea, PND or pedal edema. There is no syncope or palpitations. There is no exertional chest pain.   Current Outpatient Prescriptions  Medication Sig Dispense Refill  . amLODipine (NORVASC) 10 MG tablet Take 1 tablet (10 mg total) by mouth daily.    . cephALEXin (KEFLEX) 500 MG capsule Take 1 capsule (500 mg total) by mouth 4 (four) times daily. 40 capsule 0  . Cholecalciferol (VITAMIN D-3) 1000 UNITS CAPS Take 1,000 Units by mouth daily.     Marland Kitchen FIBER PO Take 8 g by mouth daily.     . finasteride (PROSCAR) 5 MG tablet Take 5 mg by mouth daily.    . metoprolol succinate (TOPROL-XL) 50 MG 24 hr tablet Take 1 tablet (50 mg total) by mouth daily. (Patient taking differently: Take 50 mg by mouth at bedtime. ) 30 tablet 9  . Omega-3 Fatty Acids (FISH OIL) 1200 MG CAPS Take 1,200 mg by mouth daily.     . Rivaroxaban (XARELTO) 15 MG TABS tablet Take 1 tablet (15 mg total) by mouth daily with supper. 90 tablet 3  . tamsulosin (FLOMAX) 0.4 MG CAPS capsule Take 0.8 mg by mouth daily.      No current facility-administered  medications for this visit.      Past Medical History:  Diagnosis Date  . Basal cell cancer   . BPH (benign prostatic hyperplasia)   . Gout   . Hypertension   . Renal insufficiency     Past Surgical History:  Procedure Laterality Date  . NOSE SURGERY    . Skin cancer removed    . TONSILLECTOMY    . TRANSURETHRAL RESECTION OF PROSTATE      Social History   Social History  . Marital status: Married    Spouse name: N/A  . Number of children: 6  . 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 Smoking status: Former Smoker    Quit date: 10/17/1994  . Smokeless tobacco: Never Used  . Alcohol use Yes     Comment: Occasional  . Drug use: No  . Sexual activity: Not on file   Other Topics Concern  . Not on file   Social History Narrative  . No narrative on file    Family History  Problem Relation Age of Onset  . Coronary artery disease Unknown        No family history    ROS: fatigue but no fevers or chills, productive cough, hemoptysis, dysphasia, odynophagia, melena, hematochezia, dysuria, hematuria, rash, seizure activity, orthopnea, PND, pedal edema, claudication. Remaining systems are negative.  Physical Exam: Well-developed well-nourished  in no acute distress.  Skin is warm and dry.  HEENT is normal.  Neck is supple.  Chest is clear to auscultation with normal expansion.  Cardiovascular exam is regular rate and rhythm.  Abdominal exam nontender or distended. No masses palpated. Extremities show trace to 1+ edema. neuro grossly intact  ECG- Sinus rhythm at a rate of 57. Cannot rule out prior septal infarct. personally reviewed  A/P  1 Paroxysmal atrial fibrillation-patient has converted to sinus rhythm since last office visit. We will continue with metoprolol for rate control if atrial fibrillation recurs. Continue xarelto.  2 hypertension-blood pressure is controlled. Continue present  medications.  3 palpitations-continue beta blocker.   Kirk Ruths, MD

## 2017-04-10 ENCOUNTER — Ambulatory Visit: Payer: Medicare Other | Admitting: Cardiology

## 2017-04-17 ENCOUNTER — Encounter: Payer: Self-pay | Admitting: Cardiology

## 2017-04-17 ENCOUNTER — Ambulatory Visit (INDEPENDENT_AMBULATORY_CARE_PROVIDER_SITE_OTHER): Payer: Medicare Other | Admitting: Cardiology

## 2017-04-17 VITALS — BP 134/66 | HR 57 | Ht 73.0 in | Wt 191.0 lb

## 2017-04-17 DIAGNOSIS — I10 Essential (primary) hypertension: Secondary | ICD-10-CM

## 2017-04-17 DIAGNOSIS — R002 Palpitations: Secondary | ICD-10-CM | POA: Diagnosis not present

## 2017-04-17 DIAGNOSIS — I481 Persistent atrial fibrillation: Secondary | ICD-10-CM

## 2017-04-17 DIAGNOSIS — I4819 Other persistent atrial fibrillation: Secondary | ICD-10-CM

## 2017-04-17 NOTE — Addendum Note (Signed)
Addended by: Venetia Maxon on: 04/17/2017 02:17 PM   Modules accepted: Orders

## 2017-04-17 NOTE — Patient Instructions (Signed)
Your physician wants you to follow-up in: 6 MONTHS WITH DR CRENSHAW You will receive a reminder letter in the mail two months in advance. If you don't receive a letter, please call our office to schedule the follow-up appointment.   If you need a refill on your cardiac medications before your next appointment, please call your pharmacy.  

## 2017-05-13 ENCOUNTER — Encounter (HOSPITAL_BASED_OUTPATIENT_CLINIC_OR_DEPARTMENT_OTHER): Payer: Self-pay | Admitting: Emergency Medicine

## 2017-05-13 DIAGNOSIS — I10 Essential (primary) hypertension: Secondary | ICD-10-CM | POA: Diagnosis not present

## 2017-05-13 DIAGNOSIS — Z79899 Other long term (current) drug therapy: Secondary | ICD-10-CM | POA: Diagnosis not present

## 2017-05-13 DIAGNOSIS — N3001 Acute cystitis with hematuria: Secondary | ICD-10-CM | POA: Insufficient documentation

## 2017-05-13 DIAGNOSIS — Z87891 Personal history of nicotine dependence: Secondary | ICD-10-CM | POA: Diagnosis not present

## 2017-05-13 DIAGNOSIS — R339 Retention of urine, unspecified: Secondary | ICD-10-CM | POA: Diagnosis present

## 2017-05-13 NOTE — ED Triage Notes (Signed)
PT presents with c/o blood clot in his urinary catheter and is unable to urinate . PT states he has had the catheter since June .

## 2017-05-14 ENCOUNTER — Emergency Department (HOSPITAL_BASED_OUTPATIENT_CLINIC_OR_DEPARTMENT_OTHER)
Admission: EM | Admit: 2017-05-14 | Discharge: 2017-05-14 | Disposition: A | Discharge status: Home / Self Care | Payer: Medicare Other | Attending: Physician Assistant | Admitting: Physician Assistant

## 2017-05-14 DIAGNOSIS — N3001 Acute cystitis with hematuria: Secondary | ICD-10-CM

## 2017-05-14 LAB — URINALYSIS, MICROSCOPIC (REFLEX)

## 2017-05-14 LAB — URINALYSIS, ROUTINE W REFLEX MICROSCOPIC
BILIRUBIN URINE: NEGATIVE
Glucose, UA: NEGATIVE mg/dL
KETONES UR: NEGATIVE mg/dL
Nitrite: POSITIVE — AB
PROTEIN: NEGATIVE mg/dL
Specific Gravity, Urine: <1.005 — ABNORMAL LOW (ref 1.005–1.030)
pH: 6 (ref 5.0–8.0)

## 2017-05-14 MED ORDER — CEPHALEXIN 500 MG PO CAPS: 500.0000 mg | ORAL_CAPSULE | Freq: Four times a day (QID) | ORAL | 0 refills | Status: DC

## 2017-05-14 MED ORDER — FLUCONAZOLE 50 MG PO TABS
150.0000 mg | ORAL_TABLET | Freq: Once | ORAL | Status: AC
  Administered 2017-05-14: 150 mg via ORAL
  Filled 2017-05-14: qty 1

## 2017-05-14 MED ORDER — CEPHALEXIN 250 MG PO CAPS
500.0000 mg | ORAL_CAPSULE | Freq: Once | ORAL | Status: AC
  Administered 2017-05-14: 500 mg via ORAL
  Filled 2017-05-14: qty 2

## 2017-05-14 NOTE — ED Provider Notes (Signed)
[facility] EMERGENCY DEPARTMENT Provider Note   CSN: [id number] Arrival date & time: 05/13/17  2032     History   Chief Complaint No chief complaint on file.   HPI Richard Pietila. is a 83 y.o. male.  HPI   83 year old male past medical history significant for basal cell cancer, and urinary catheter.  Patient had a urinary catheter placed after surgery and was never able to have it removed because of inability to urinate.  Patient noted that he felt like his catheter was not draining tonight.  He has a small amount of blood in the catheter.  None was noted on arrival here.  However patient had a bladder scan at 600 of urine was found in the bladder despite presence of catheter.  Past Medical History:  Diagnosis Date  . Basal cell cancer   . BPH (benign prostatic hyperplasia)   . Gout   . Hypertension   . Renal insufficiency     Patient Active Problem List   Diagnosis Date Noted  . Palpitations 01/04/2012  . Hypertension 01/04/2012  . Dyspnea 01/04/2012    Past Surgical History:  Procedure Laterality Date  . NOSE SURGERY    . Skin cancer removed    . TONSILLECTOMY    . TRANSURETHRAL RESECTION OF PROSTATE         Home Medications    Prior to Admission medications   Medication Sig Start Date End Date Taking? Authorizing Provider  amLODipine (NORVASC) 10 MG tablet Take 1 tablet (10 mg total) by mouth daily. 01/25/17   Lelon Perla, MD  Cholecalciferol (VITAMIN D-3) 1000 UNITS CAPS Take 1,000 Units by mouth daily.     [provider]  FIBER PO Take 8 g by mouth daily.     [provider]  finasteride (PROSCAR) 5 MG tablet Take 5 mg by mouth daily.    [provider]  metoprolol succinate (TOPROL-XL) 50 MG 24 hr tablet Take 1 tablet (50 mg total) by mouth daily. Patient taking differently: Take 50 mg by mouth at bedtime.  10/04/16   Lelon Perla, MD  Omega-3 Fatty Acids (FISH OIL) 1200 MG CAPS Take 1,200 mg by  mouth daily.     [provider]  Rivaroxaban (XARELTO) 15 MG TABS tablet Take 1 tablet (15 mg total) by mouth daily with supper. 01/25/17   Lelon Perla, MD  tamsulosin (FLOMAX) 0.4 MG CAPS capsule Take 0.8 mg by mouth daily.  04/29/13   [provider]    Family History Family History  Problem Relation Age of Onset  . Coronary artery disease Unknown        No family history    Social History Social History  Substance Use Topics  . Smoking status: Former Smoker    Quit date: 10/17/1994  . Smokeless tobacco: Never Used  . Alcohol use Yes     Comment: Occasional     Allergies   Latex   Review of Systems Review of Systems  Constitutional: Negative for activity change.  Respiratory: Negative for shortness of breath.   Cardiovascular: Negative for chest pain.  Gastrointestinal: Negative for abdominal pain.  Genitourinary: Positive for difficulty urinating.     Physical Exam Updated Vital Signs BP (!) 167/80 (BP Location: Left Arm)   Pulse (!) 59   Temp 98.5 F (36.9 C) (Oral)   Resp 20   SpO2 100%   Physical Exam  Constitutional: He is oriented to person, place, and  time. He appears well-nourished.  HENT:  Head: Normocephalic.  Eyes: Conjunctivae are normal.  Cardiovascular: Normal rate.   Pulmonary/Chest: Effort normal and breath sounds normal.  Abdominal: Soft. Bowel sounds are normal. He exhibits no distension.  Genitourinary:  Genitourinary Comments: Patient has mild excoriation, with evidence of yeast.  Neurological: He is oriented to person, place, and time.  Skin: Skin is warm and dry. He is not diaphoretic.  Psychiatric: He has a normal mood and affect. His behavior is normal.     ED Treatments / Results  Labs (all labs ordered are listed, but only abnormal results are displayed) Labs Reviewed  URINE CULTURE  URINALYSIS, ROUTINE W REFLEX MICROSCOPIC    EKG  EKG Interpretation None       Radiology No results  found.  Procedures Procedures (including critical care time)  Medications Ordered in ED Medications - No data to display   Initial Impression / Assessment and Plan / ED Course  I have reviewed the triage vital signs and the nursing notes.  Pertinent labs & imaging results that were available during my care of the patient were reviewed by me and considered in my medical decision making (see chart for details).      83 year old male past medical history significant for basal cell cancer, and urinary catheter.  Patient had a urinary catheter placed after surgery and was never able to have it removed because of inability to urinate.  Patient noted that he felt like his catheter was not draining tonight.  He has a small amount of blood in the catheter.  None was noted on arrival here.  However patient had a bladder scan at 600 of urine was found in the bladder despite presence of catheter.  1:16 AM   Replaced catheter and was able to get 600 cc out.  Will send for culture.  Treat with Diflucan and abx (nitrate postiive) and have patient follow-up bladder culture.  Pt is in agreemnet   Final Clinical Impressions(s) / ED Diagnoses   Final diagnoses:  None    New Prescriptions New Prescriptions   No medications on file     Macarthur Critchley, MD 05/14/17 [phone]

## 2017-05-14 NOTE — ED Notes (Signed)
Pt fitted with latex-free leg bag. Demonstrates understanding of how to use.

## 2017-05-14 NOTE — ED Notes (Signed)
Pt given d/c instructions as per chart. Rx x 1. Verbalizes understanding. No questions. 

## 2017-05-14 NOTE — Discharge Instructions (Signed)
Treating you for urinary tract infection.  We did send her urine for culture.  If you are not improving please return to [facility] and have them check the cultures that have been completed.  Return with any high fevers, difficulty with draining the catheter or any other concerns.

## 2017-05-14 NOTE — ED Notes (Addendum)
Pt reporting increased pain in his bladder. See note from bladder scan. Current foley removed and no output noted upon removal.

## 2017-05-15 ENCOUNTER — Encounter (HOSPITAL_BASED_OUTPATIENT_CLINIC_OR_DEPARTMENT_OTHER): Payer: Self-pay

## 2017-05-15 ENCOUNTER — Emergency Department (HOSPITAL_BASED_OUTPATIENT_CLINIC_OR_DEPARTMENT_OTHER)
Admission: EM | Admit: 2017-05-15 | Discharge: 2017-05-15 | Disposition: A | Discharge status: Home / Self Care | Payer: Medicare Other | Attending: Emergency Medicine | Admitting: Emergency Medicine

## 2017-05-15 DIAGNOSIS — Z87891 Personal history of nicotine dependence: Secondary | ICD-10-CM | POA: Diagnosis not present

## 2017-05-15 DIAGNOSIS — Z7901 Long term (current) use of anticoagulants: Secondary | ICD-10-CM | POA: Insufficient documentation

## 2017-05-15 DIAGNOSIS — Y732 Prosthetic and other implants, materials and accessory gastroenterology and urology devices associated with adverse incidents: Secondary | ICD-10-CM | POA: Insufficient documentation

## 2017-05-15 DIAGNOSIS — Z79899 Other long term (current) drug therapy: Secondary | ICD-10-CM | POA: Diagnosis not present

## 2017-05-15 DIAGNOSIS — Z91040 Latex allergy status: Secondary | ICD-10-CM | POA: Diagnosis not present

## 2017-05-15 DIAGNOSIS — I129 Hypertensive chronic kidney disease with stage 1 through stage 4 chronic kidney disease, or unspecified chronic kidney disease: Secondary | ICD-10-CM | POA: Diagnosis not present

## 2017-05-15 DIAGNOSIS — N189 Chronic kidney disease, unspecified: Secondary | ICD-10-CM | POA: Insufficient documentation

## 2017-05-15 DIAGNOSIS — R39198 Other difficulties with micturition: Secondary | ICD-10-CM | POA: Diagnosis present

## 2017-05-15 DIAGNOSIS — T83091A Other mechanical complication of indwelling urethral catheter, initial encounter: Secondary | ICD-10-CM | POA: Diagnosis not present

## 2017-05-15 NOTE — ED Triage Notes (Signed)
Pt reports new foley placed here 2 days ago-no UO in bag x 3 hours-NAD-steady gait

## 2017-05-15 NOTE — ED Notes (Signed)
Patient reports that he has had increased pressure to his catheter since this am - no noted bloody urine. Catheter repositioned and flushed. Good return of urine

## 2017-05-15 NOTE — ED Provider Notes (Signed)
[facility] EMERGENCY DEPARTMENT Provider Note   CSN: [id number] Arrival date & time: 05/15/17  1407     History   Chief Complaint Chief Complaint  Patient presents with  . Foley cath blockage    HPI Richard Vincent. is a 83 y.o. male.  HPI  Catheter changed yesterday, was doing well from yesterday until this morning, urine was clear 11AM today it stopped draining, then 130-2PM began to feel uncomfortable, noted still no urine in bag, Feeling that needed to urinate and it wasn't occurring.  Has had similar symptoms in the past.  Has had cathtere since June.  No back pain, no fever, no nausea or vomiting.     Past Medical History:  Diagnosis Date  . Basal cell cancer   . BPH (benign prostatic hyperplasia)   . Gout   . Hypertension   . Renal insufficiency     Patient Active Problem List   Diagnosis Date Noted  . Palpitations 01/04/2012  . Hypertension 01/04/2012  . Dyspnea 01/04/2012    Past Surgical History:  Procedure Laterality Date  . NOSE SURGERY    . Skin cancer removed    . TONSILLECTOMY    . TRANSURETHRAL RESECTION OF PROSTATE         Home Medications    Prior to Admission medications   Medication Sig Start Date End Date Taking? Authorizing Provider  amLODipine (NORVASC) 10 MG tablet Take 1 tablet (10 mg total) by mouth daily. 01/25/17   Lelon Perla, MD  cephALEXin (KEFLEX) 500 MG capsule Take 1 capsule (500 mg total) by mouth 4 (four) times daily. 05/14/17   Mackuen, Courteney Lyn, MD  Cholecalciferol (VITAMIN D-3) 1000 UNITS CAPS Take 1,000 Units by mouth daily.     [provider]  FIBER PO Take 8 g by mouth daily.     [provider]  finasteride (PROSCAR) 5 MG tablet Take 5 mg by mouth daily.    [provider]  metoprolol succinate (TOPROL-XL) 50 MG 24 hr tablet Take 1 tablet (50 mg total) by mouth daily. Patient taking differently: Take 50 mg by mouth at bedtime.  10/04/16   Lelon Perla, MD    Omega-3 Fatty Acids (FISH OIL) 1200 MG CAPS Take 1,200 mg by mouth daily.     [provider]  Rivaroxaban (XARELTO) 15 MG TABS tablet Take 1 tablet (15 mg total) by mouth daily with supper. 01/25/17   Lelon Perla, MD  tamsulosin (FLOMAX) 0.4 MG CAPS capsule Take 0.8 mg by mouth daily.  04/29/13   [provider]    Family History Family History  Problem Relation Age of Onset  . Coronary artery disease Unknown        No family history    Social History Social History  Substance Use Topics  . Smoking status: Former Smoker    Quit date: 10/17/1994  . Smokeless tobacco: Never Used  . Alcohol use Yes     Comment: Occasional     Allergies   Latex   Review of Systems Review of Systems  Constitutional: Negative for fever.  HENT: Negative for sore throat.   Eyes: Negative for visual disturbance.  Respiratory: Negative for shortness of breath.   Cardiovascular: Negative for chest pain.  Gastrointestinal: Negative for abdominal pain, nausea and vomiting.  Genitourinary: Positive for difficulty urinating. Negative for flank pain and hematuria.  Musculoskeletal: Negative for back pain and neck stiffness.  Skin: Negative for rash.  Neurological:  Negative for syncope and headaches.     Physical Exam Updated Vital Signs BP (!) 154/51 (BP Location: Left Arm)   Pulse (!) 54   Temp 97.8 F (36.6 C) (Oral)   Resp 18   SpO2 100%   Physical Exam  Constitutional: He is oriented to person, place, and time. He appears well-developed and well-nourished. No distress.  HENT:  Head: Normocephalic and atraumatic.  Eyes: Conjunctivae and EOM are normal.  Neck: Normal range of motion.  Cardiovascular: Normal rate, regular rhythm, normal heart sounds and intact distal pulses.  Exam reveals no gallop and no friction rub.   No murmur heard. Pulmonary/Chest: Effort normal and breath sounds normal. No respiratory distress. He has no wheezes. He has no rales.  Abdominal:  Soft. He exhibits no distension. There is no tenderness. There is no guarding.  Genitourinary:  Genitourinary Comments: Foley in place, urine noted flowing into catheter, no penile erythema, discharge or swelling  Musculoskeletal: He exhibits no edema.  Neurological: He is alert and oriented to person, place, and time.  Skin: Skin is warm and dry. He is not diaphoretic.  Nursing note and vitals reviewed.    ED Treatments / Results  Labs (all labs ordered are listed, but only abnormal results are displayed) Labs Reviewed - No data to display  EKG  EKG Interpretation None       Radiology No results found.  Procedures Procedures (including critical care time)  Medications Ordered in ED Medications - No data to display   Initial Impression / Assessment and Plan / ED Course  I have reviewed the triage vital signs and the nursing notes.  Pertinent labs & imaging results that were available during my care of the patient were reviewed by me and considered in my medical decision making (see chart for details).     83 year old male with history of both of including chronic indwelling Foley, diagnosis of urinary tract infection with Foley catheter change yesterday very early in the morning, presents with concern for obstruction of his foley catheter.    Patient denies other symptoms.  No hematuria presen catheter was repositioned and flushed by nursing, with significant amount of urine output.  Urine appears to be flowing normally at this time without obstruction.  Recommend continuing antibiotics as prescribed yesterday and following up with his primary care physician and/or urology. Patient discharged in stable condition with understanding of reasons to return.  Final Clinical Impressions(s) / ED Diagnoses   Final diagnoses:  Obstruction of Foley catheter, initial encounter [facility])    New Prescriptions Discharge Medication List as of 05/15/2017  2:50 PM       Gareth Morgan, MD 05/15/17 1936

## 2017-05-16 LAB — URINE CULTURE: Culture: >=100000 — AB

## 2017-05-17 ENCOUNTER — Telehealth: Payer: Self-pay | Admitting: *Deleted

## 2017-05-17 NOTE — Progress Notes (Signed)
ED Antimicrobial Stewardship Positive Culture Follow Up   Richard Vincent. is an 83 y.o. male who presented to [facility] on 05/15/2017 with a chief complaint of  Chief Complaint  Patient presents with  . Foley cath blockage    Recent Results (from the past 720 hour(s))  Urine culture     Status: Abnormal   Collection Time: 05/14/17 12:04 AM  Result Value Ref Range Status   Specimen Description URINE, CATHETERIZED  Final   Special Requests NONE  Final   Culture >=100,000 COLONIES/mL PSEUDOMONAS AERUGINOSA (A)  Final   Report Status 05/16/2017 FINAL  Final   Organism ID, Bacteria PSEUDOMONAS AERUGINOSA (A)  Final      Susceptibility   Pseudomonas aeruginosa - MIC*    CEFTAZIDIME 2 SENSITIVE Sensitive     CIPROFLOXACIN <=0.25 SENSITIVE Sensitive     GENTAMICIN 2 SENSITIVE Sensitive     IMIPENEM 1 SENSITIVE Sensitive     PIP/TAZO 8 SENSITIVE Sensitive     CEFEPIME <=1 SENSITIVE Sensitive     * >=100,000 COLONIES/mL PSEUDOMONAS AERUGINOSA    [x]  Treated with cephalexin, organism resistant to prescribed antimicrobial []  Patient discharged originally without antimicrobial agent and treatment is now indicated  New antibiotic prescription: Ciprofloxacin 500mg  every 12h for 5 days  ED Provider: Langston Masker, PA-C  Bertis Ruddy, PharmD Pharmacy Resident Pager #: [phone] 05/17/2017 9:40 AM

## 2017-05-17 NOTE — Telephone Encounter (Signed)
Post ED Visit - Positive Culture Follow-up: Successful Patient Follow-Up  Culture assessed and recommendations reviewed by: []  Elenor Quinones, Pharm.D. []  Heide Guile, Pharm.D., BCPS AQ-ID []  Parks Neptune, Pharm.D., BCPS []  Alycia Rossetti, Pharm.D., BCPS []  [place], Pharm.D., BCPS, AAHIVP []  Legrand Como, Pharm.D., BCPS, AAHIVP []  Salome Arnt, PharmD, BCPS []  Dimitri Ped, PharmD, BCPS []  Vincenza Hews, PharmD, BCPS Pearson Grippe, PharmD  Positive urine culture  []  Patient discharged without antimicrobial prescription and treatment is now indicated [x]  Organism is resistant to prescribed ED discharge antimicrobial []  Patient with positive blood cultures  Changes discussed with ED provider, Langston Masker, PA-C New antibiotic prescription Cipro 500mg  PO BID x 5 days Called to [facility], [facility] [phone]  Contacted patient, date 05/17/2017, time Register 05/17/2017, 11:41 AM

## 2017-05-22 ENCOUNTER — Encounter (HOSPITAL_BASED_OUTPATIENT_CLINIC_OR_DEPARTMENT_OTHER): Payer: Self-pay | Admitting: Emergency Medicine

## 2017-05-22 ENCOUNTER — Emergency Department (HOSPITAL_BASED_OUTPATIENT_CLINIC_OR_DEPARTMENT_OTHER)
Admission: EM | Admit: 2017-05-22 | Discharge: 2017-05-22 | Disposition: A | Discharge status: Home / Self Care | Payer: Medicare Other | Attending: Emergency Medicine | Admitting: Emergency Medicine

## 2017-05-22 ENCOUNTER — Other Ambulatory Visit: Payer: Self-pay

## 2017-05-22 DIAGNOSIS — Y828 Other medical devices associated with adverse incidents: Secondary | ICD-10-CM | POA: Diagnosis not present

## 2017-05-22 DIAGNOSIS — Z91040 Latex allergy status: Secondary | ICD-10-CM | POA: Diagnosis not present

## 2017-05-22 DIAGNOSIS — I10 Essential (primary) hypertension: Secondary | ICD-10-CM | POA: Insufficient documentation

## 2017-05-22 DIAGNOSIS — T83098D Other mechanical complication of other indwelling urethral catheter, subsequent encounter: Secondary | ICD-10-CM | POA: Insufficient documentation

## 2017-05-22 DIAGNOSIS — Z87891 Personal history of nicotine dependence: Secondary | ICD-10-CM | POA: Diagnosis not present

## 2017-05-22 DIAGNOSIS — Z85828 Personal history of other malignant neoplasm of skin: Secondary | ICD-10-CM | POA: Diagnosis not present

## 2017-05-22 DIAGNOSIS — Z79899 Other long term (current) drug therapy: Secondary | ICD-10-CM | POA: Diagnosis not present

## 2017-05-22 DIAGNOSIS — T839XXD Unspecified complication of genitourinary prosthetic device, implant and graft, subsequent encounter: Secondary | ICD-10-CM

## 2017-05-22 LAB — URINALYSIS, MICROSCOPIC (REFLEX)

## 2017-05-22 LAB — URINALYSIS, ROUTINE W REFLEX MICROSCOPIC
Bilirubin Urine: NEGATIVE
Glucose, UA: NEGATIVE mg/dL
Ketones, ur: NEGATIVE mg/dL
NITRITE: NEGATIVE
PH: 6 (ref 5.0–8.0)
Protein, ur: NEGATIVE mg/dL
SPECIFIC GRAVITY, URINE: 1.02 (ref 1.005–1.030)

## 2017-05-22 NOTE — ED Provider Notes (Signed)
Emergency Department Provider Note   I have reviewed the triage vital signs and the nursing notes.   HISTORY  Chief Complaint foley catheter problem   HPI Richard Vincent. is a 83 y.o. male with PMH of BPH, HTN, and basal cell carcinoma presents to the emergency department for evaluation of no urine output from his indwelling Foley catheter over the past 4 hours.  Patient states that prior to this he was having normal output that was pale yellow and non-bloody.  He denies any lower abdominal discomfort or back pain.  No fevers or chills.  His catheter was last changed in the ED 10/29 when he came in for a similar issue.  He is followed by [facility] but does not have an appointment scheduled.  The catheter is been in place since June and he is failed multiple outpatient void trials.  He took his last dose of Cipro today as a UTI prophylaxis. Denies any trauma to the catheter site.   Past Medical History:  Diagnosis Date  . Basal cell cancer   . BPH (benign prostatic hyperplasia)   . Gout   . Hypertension   . Renal insufficiency     Patient Active Problem List   Diagnosis Date Noted  . Palpitations 01/04/2012  . Hypertension 01/04/2012  . Dyspnea 01/04/2012    Past Surgical History:  Procedure Laterality Date  . NOSE SURGERY    . Skin cancer removed    . TONSILLECTOMY    . TRANSURETHRAL RESECTION OF PROSTATE      Current Outpatient Rx  . Order #: [id number] Class: No Print  . Order #: [id number] Class: Print  . Order #: [id number] Class: Historical Med  . Order #: [id number] Class: Historical Med  . Order #: [id number] Class: Historical Med  . Order #: [id number] Class: Normal  . Order #: [id number] Class: Historical Med  . Order #: [id number] Class: Normal  . Order #: [id number] Class: Historical Med    Allergies Latex  Family History  Problem Relation Age of Onset  . Coronary artery disease Unknown        No family history    Social History Social History    Tobacco Use  . Smoking status: Former Smoker    Last attempt to quit: 10/17/1994    Years since quitting: 22.6  . Smokeless tobacco: Never Used  Substance Use Topics  . Alcohol use: Yes    Comment: Occasional  . Drug use: No    Review of Systems  Constitutional: No fever/chills Eyes: No visual changes. ENT: No sore throat. Cardiovascular: Denies chest pain. Respiratory: Denies shortness of breath. Gastrointestinal: No abdominal pain.  No nausea, no vomiting.  No diarrhea.  No constipation. Genitourinary: Negative for dysuria. Positive stop in urine output.  Musculoskeletal: Negative for back pain. Skin: Negative for rash. Neurological: Negative for headaches, focal weakness or numbness.  10-point ROS otherwise negative.  ____________________________________________   PHYSICAL EXAM:  VITAL SIGNS: ED Triage Vitals [05/22/17 1452]  Enc Vitals Group     BP (!) 163/62     Pulse Rate (!) 58     Resp 18     Temp (!) 97.5 F (36.4 C)     Temp Source Oral     SpO2 99 %     Weight 200 lb (90.7 kg)     Height 6\' 1"  (1.854 m)   Constitutional: Alert and oriented. Well appearing and in no acute distress. Eyes: Conjunctivae are normal.  Head: Atraumatic. Nose: No congestion/rhinnorhea. Mouth/Throat: Mucous membranes  are moist.  Oropharynx non-erythematous. Neck: No stridor.  Cardiovascular: Normal rate, regular rhythm. Good peripheral circulation. Grossly normal heart sounds.   Respiratory: Normal respiratory effort.  No retractions. Lungs CTAB. Gastrointestinal: Soft and nontender. No distention. Well-appearing foley catheter with no urine in the leg bag. No apparent.  Musculoskeletal: No lower extremity tenderness nor edema. No gross deformities of extremities. Neurologic:  Normal speech and language. No gross focal neurologic deficits are appreciated.  Skin:  Skin is warm, dry and intact. No rash noted.  ____________________________________________   LABS (all labs  ordered are listed, but only abnormal results are displayed)  Labs Reviewed  URINE CULTURE  URINALYSIS, ROUTINE W REFLEX MICROSCOPIC   ____________________________________________   PROCEDURES  Procedure(s) performed:   Procedures  None ____________________________________________   INITIAL IMPRESSION / ASSESSMENT AND PLAN / ED COURSE  Pertinent labs & imaging results that were available during my care of the patient were reviewed by me and considered in my medical decision making (see chart for details).  Patient presents to the emergency department for evaluation of no Foley output for the past 4 hours.  No trauma to the area.  Plan for bladder scan, UA, flushing with repositioning of the Foley.  Patient will need to follow with urology as an outpatient.  03:50 PM A Foley catheter balloon deflated and then advanced with reinflation.  Slightly more fluid placed in the balloon.  Patient drained 450 ml of urine and is now draining freely.  Urine is clear with no gross hematuria.  Plan for outpatient urology follow-up.  At this time, I do not feel there is any life-threatening condition present. I have reviewed and discussed all results (EKG, imaging, lab, urine as appropriate), exam findings with patient. I have reviewed nursing notes and appropriate previous records.  I feel the patient is safe to be discharged home without further emergent workup. Discussed usual and customary return precautions. Patient and family (if present) verbalize understanding and are comfortable with this plan.  Patient will follow-up with their primary care provider. If they do not have a primary care provider, information for follow-up has been provided to them. All questions have been answered.  ____________________________________________  FINAL CLINICAL IMPRESSION(S) / ED DIAGNOSES  Final diagnoses:  Problem with Foley catheter, subsequent encounter     MEDICATIONS GIVEN DURING THIS  VISIT:  Medications - No data to display   Note:  This document was prepared using Dragon voice recognition software and may include unintentional dictation errors.  Nanda Quinton, MD Emergency Medicine    Stanlee Roehrig, [facility], MD 05/22/17 [phone]

## 2017-05-22 NOTE — ED Triage Notes (Signed)
Reports no UOP x 3.5 hours.  Currently with foley catheter with leg bag.  States he has had previous issues in the past with foley.

## 2017-05-22 NOTE — ED Notes (Signed)
Patients foley catheter repositioned slightly by decreasing the balloon and advance the cather about 1 inch. The patient immediately put out 400 cc's of urine

## 2017-05-22 NOTE — Discharge Instructions (Signed)
You were seen in the ED today with a foley catheter blockage. We re-positioned the catheter and it is now working. Follow up with your Urologist by phone tomorrow.

## 2017-05-23 ENCOUNTER — Other Ambulatory Visit: Payer: Self-pay

## 2017-05-23 ENCOUNTER — Encounter (HOSPITAL_BASED_OUTPATIENT_CLINIC_OR_DEPARTMENT_OTHER): Payer: Self-pay | Admitting: *Deleted

## 2017-05-23 ENCOUNTER — Emergency Department (HOSPITAL_BASED_OUTPATIENT_CLINIC_OR_DEPARTMENT_OTHER)
Admission: EM | Admit: 2017-05-23 | Discharge: 2017-05-24 | Disposition: A | Discharge status: Home / Self Care | Payer: Medicare Other | Attending: Emergency Medicine | Admitting: Emergency Medicine

## 2017-05-23 DIAGNOSIS — Z7901 Long term (current) use of anticoagulants: Secondary | ICD-10-CM | POA: Insufficient documentation

## 2017-05-23 DIAGNOSIS — I10 Essential (primary) hypertension: Secondary | ICD-10-CM | POA: Diagnosis not present

## 2017-05-23 DIAGNOSIS — R339 Retention of urine, unspecified: Secondary | ICD-10-CM | POA: Diagnosis present

## 2017-05-23 DIAGNOSIS — Z91040 Latex allergy status: Secondary | ICD-10-CM | POA: Diagnosis not present

## 2017-05-23 DIAGNOSIS — Z87891 Personal history of nicotine dependence: Secondary | ICD-10-CM | POA: Insufficient documentation

## 2017-05-23 DIAGNOSIS — Z85828 Personal history of other malignant neoplasm of skin: Secondary | ICD-10-CM | POA: Insufficient documentation

## 2017-05-23 DIAGNOSIS — Z79899 Other long term (current) drug therapy: Secondary | ICD-10-CM | POA: Diagnosis not present

## 2017-05-23 LAB — URINALYSIS, ROUTINE W REFLEX MICROSCOPIC
BILIRUBIN URINE: NEGATIVE
Glucose, UA: NEGATIVE mg/dL
Ketones, ur: NEGATIVE mg/dL
Nitrite: NEGATIVE
PROTEIN: NEGATIVE mg/dL
Specific Gravity, Urine: 1.02 (ref 1.005–1.030)
pH: 6 (ref 5.0–8.0)

## 2017-05-23 LAB — URINE CULTURE
CULTURE: NO GROWTH
SPECIAL REQUESTS: NORMAL

## 2017-05-23 LAB — URINALYSIS, MICROSCOPIC (REFLEX)

## 2017-05-23 NOTE — ED Triage Notes (Signed)
Foley catheter is not draining. States he was here yesterday for same. It was repositioned and extra water added to balloon. 3 hours ago his urine output stopped.

## 2017-05-23 NOTE — ED Notes (Signed)
Pt updated to wait time, no rooms available at this time

## 2017-05-24 LAB — BASIC METABOLIC PANEL
Anion gap: 5 (ref 5–15)
BUN: 31 mg/dL — AB (ref 6–20)
CHLORIDE: 111 mmol/L (ref 101–111)
CO2: 23 mmol/L (ref 22–32)
CREATININE: 1.62 mg/dL — AB (ref 0.61–1.24)
Calcium: 8.5 mg/dL — ABNORMAL LOW (ref 8.9–10.3)
GFR calc Af Amer: 44 mL/min — ABNORMAL LOW (ref >60)
GFR, EST NON AFRICAN AMERICAN: 38 mL/min — AB (ref >60)
Glucose, Bld: 104 mg/dL — ABNORMAL HIGH (ref 65–99)
Potassium: 4.5 mmol/L (ref 3.5–5.1)
SODIUM: 139 mmol/L (ref 135–145)

## 2017-05-24 NOTE — ED Provider Notes (Signed)
[place] EMERGENCY DEPARTMENT Provider Note   CSN: [id number] Arrival date & time: 05/23/17  2055     History   Chief Complaint Chief Complaint  Patient presents with  . Urinary Retention    HPI Richard Ackers. is a 83 y.o. male.  The history is provided by the patient.  Illness  This is a chronic problem. The current episode started more than 1 week ago. The problem occurs daily. The problem has not changed since onset.Pertinent negatives include no chest pain, no headaches and no shortness of breath. Nothing aggravates the symptoms. Nothing relieves the symptoms. He has tried nothing for the symptoms. The treatment provided no relief.  Urinary retention and problems with foley ongoing.  Patient would like a catheter with a 30-50 cc balloon as he feels this is the problem.  Has not followed up with urology.    Past Medical History:  Diagnosis Date  . Basal cell cancer   . BPH (benign prostatic hyperplasia)   . Gout   . Hypertension   . Renal insufficiency     Patient Active Problem List   Diagnosis Date Noted  . Palpitations 01/04/2012  . Hypertension 01/04/2012  . Dyspnea 01/04/2012    Past Surgical History:  Procedure Laterality Date  . NOSE SURGERY    . Skin cancer removed    . TONSILLECTOMY    . TRANSURETHRAL RESECTION OF PROSTATE         Home Medications    Prior to Admission medications   Medication Sig Start Date End Date Taking? Authorizing Provider  amLODipine (NORVASC) 10 MG tablet Take 1 tablet (10 mg total) by mouth daily. 01/25/17   Lelon Perla, MD  cephALEXin (KEFLEX) 500 MG capsule Take 1 capsule (500 mg total) by mouth 4 (four) times daily. 05/14/17   Mackuen, Courteney Lyn, MD  Cholecalciferol (VITAMIN D-3) 1000 UNITS CAPS Take 1,000 Units by mouth daily.     [provider]  FIBER PO Take 8 g by mouth daily.     [provider]  finasteride (PROSCAR) 5 MG tablet Take 5 mg by mouth daily.     [provider]  metoprolol succinate (TOPROL-XL) 50 MG 24 hr tablet Take 1 tablet (50 mg total) by mouth daily. Patient taking differently: Take 50 mg by mouth at bedtime.  10/04/16   Lelon Perla, MD  Omega-3 Fatty Acids (FISH OIL) 1200 MG CAPS Take 1,200 mg by mouth daily.     [provider]  Rivaroxaban (XARELTO) 15 MG TABS tablet Take 1 tablet (15 mg total) by mouth daily with supper. 01/25/17   Lelon Perla, MD  tamsulosin (FLOMAX) 0.4 MG CAPS capsule Take 0.8 mg by mouth daily.  04/29/13   [provider]    Family History Family History  Problem Relation Age of Onset  . Coronary artery disease Unknown        No family history    Social History Social History   Tobacco Use  . Smoking status: Former Smoker    Last attempt to quit: 10/17/1994    Years since quitting: 22.6  . Smokeless tobacco: Never Used  Substance Use Topics  . Alcohol use: Yes    Comment: Occasional  . Drug use: No     Allergies   Latex   Review of Systems Review of Systems  Constitutional: Negative for fever.  Respiratory: Negative for shortness of breath.   Cardiovascular: Negative for chest pain.  Genitourinary:  Positive for difficulty urinating.  Neurological: Negative for headaches.  All other systems reviewed and are negative.    Physical Exam Updated Vital Signs BP (!) 158/62   Pulse (!) 109   Temp 98.1 F (36.7 C) (Oral)   Resp 18   Ht 6\' 1"  (1.854 m)   Wt 90.7 kg (200 lb)   SpO2 99%   BMI 26.39 kg/m   Physical Exam  Constitutional: He is oriented to person, place, and time. He appears well-developed and well-nourished. No distress.  HENT:  Head: Normocephalic and atraumatic.  Mouth/Throat: No oropharyngeal exudate.  Eyes: Conjunctivae are normal. Pupils are equal, round, and reactive to light.  Neck: Normal range of motion. Neck supple.  Cardiovascular: Normal rate, regular rhythm, normal heart sounds and intact distal pulses.    Pulmonary/Chest: Effort normal and breath sounds normal. No respiratory distress.  Abdominal: Soft. Bowel sounds are normal. He exhibits no mass. There is no tenderness. There is no rebound and no guarding.  Musculoskeletal: Normal range of motion.  Neurological: He is alert and oriented to person, place, and time. He displays normal reflexes.  Skin: Skin is warm and dry. Capillary refill takes less than 2 seconds.  Psychiatric: He has a normal mood and affect.     ED Treatments / Results  Labs (all labs ordered are listed, but only abnormal results are displayed)  Results for orders placed or performed during the hospital encounter of 05/23/17  Urinalysis, Routine w reflex microscopic  Result Value Ref Range   Color, Urine YELLOW YELLOW   APPearance CLEAR CLEAR   Specific Gravity, Urine 1.020 1.005 - 1.030   pH 6.0 5.0 - 8.0   Glucose, UA NEGATIVE NEGATIVE mg/dL   Hgb urine dipstick LARGE (A) NEGATIVE   Bilirubin Urine NEGATIVE NEGATIVE   Ketones, ur NEGATIVE NEGATIVE mg/dL   Protein, ur NEGATIVE NEGATIVE mg/dL   Nitrite NEGATIVE NEGATIVE   Leukocytes, UA TRACE (A) NEGATIVE  Urinalysis, Microscopic (reflex)  Result Value Ref Range   RBC / HPF 6-30 0 - 5 RBC/hpf   WBC, UA 0-5 0 - 5 WBC/hpf   Bacteria, UA RARE (A) NONE SEEN   Squamous Epithelial / LPF 0-5 (A) NONE SEEN   No results found.  Radiology No results found.  Procedures Procedures (including critical care time)  Medications Ordered in ED Medications - No data to display     Final Clinical Impressions(s) / ED Diagnoses   Follow up with urology for ongoing issues. Return for swelling or the lips or tongue, chest pain, dyspnea on exertion, new weakness or numbness changes in vision or speech,  Inability to tolerate liquids or food, changes in voice cough, altered mental status or any concerns. No signs of systemic illness or infection. The patient is nontoxic-appearing on exam and vital signs are within normal  limits.    I have reviewed the triage vital signs and the nursing notes. Pertinent labs &imaging results that were available during my care of the patient were reviewed by me and considered in my medical decision making (see chart for details).  After history, exam, and medical workup I feel the patient has been appropriately medically screened and is safe for discharge home. Pertinent diagnoses were discussed with the patient. Patient was given return precautions.   Crytal Pensinger, MD 05/24/17 0020

## 2017-06-18 ENCOUNTER — Other Ambulatory Visit: Payer: Self-pay

## 2017-06-18 ENCOUNTER — Emergency Department (HOSPITAL_BASED_OUTPATIENT_CLINIC_OR_DEPARTMENT_OTHER)
Admission: EM | Admit: 2017-06-18 | Discharge: 2017-06-18 | Disposition: A | Discharge status: Home / Self Care | Payer: Medicare Other | Attending: Emergency Medicine | Admitting: Emergency Medicine

## 2017-06-18 ENCOUNTER — Encounter (HOSPITAL_BASED_OUTPATIENT_CLINIC_OR_DEPARTMENT_OTHER): Payer: Self-pay | Admitting: Emergency Medicine

## 2017-06-18 DIAGNOSIS — R339 Retention of urine, unspecified: Secondary | ICD-10-CM | POA: Diagnosis present

## 2017-06-18 DIAGNOSIS — Y69 Unspecified misadventure during surgical and medical care: Secondary | ICD-10-CM | POA: Insufficient documentation

## 2017-06-18 DIAGNOSIS — T83091A Other mechanical complication of indwelling urethral catheter, initial encounter: Secondary | ICD-10-CM | POA: Diagnosis not present

## 2017-06-18 NOTE — ED Provider Notes (Signed)
[facility] EMERGENCY DEPARTMENT Provider Note   CSN: [id number] Arrival date & time: 06/18/17  1301     History   Chief Complaint Chief Complaint  Patient presents with  . Urinary Retention    HPI Richard Vincent. is a 83 y.o. male.  HPI Patient presents with decreased urine output into his Foley catheter.  Has had a Foley catheter for around 6 months.  Has had easy change overs in the past.  States that around 830 this morning he had decreased output.  He felt as if he had to urinate.  States this is happened before.  States it does happen when it came out too far.  No fevers or chills.  Upon my evaluation of the nurse it artery position of the Foley and had good urine return. Past Medical History:  Diagnosis Date  . Basal cell cancer   . BPH (benign prostatic hyperplasia)   . Gout   . Hypertension   . Renal insufficiency     Patient Active Problem List   Diagnosis Date Noted  . Palpitations 01/04/2012  . Hypertension 01/04/2012  . Dyspnea 01/04/2012    Past Surgical History:  Procedure Laterality Date  . NOSE SURGERY    . Skin cancer removed    . TONSILLECTOMY    . TRANSURETHRAL RESECTION OF PROSTATE         Home Medications    Prior to Admission medications   Medication Sig Start Date End Date Taking? Authorizing Provider  amLODipine (NORVASC) 10 MG tablet Take 1 tablet (10 mg total) by mouth daily. 01/25/17   Lelon Perla, MD  cephALEXin (KEFLEX) 500 MG capsule Take 1 capsule (500 mg total) by mouth 4 (four) times daily. 05/14/17   Mackuen, Courteney Lyn, MD  Cholecalciferol (VITAMIN D-3) 1000 UNITS CAPS Take 1,000 Units by mouth daily.     [provider]  FIBER PO Take 8 g by mouth daily.     [provider]  finasteride (PROSCAR) 5 MG tablet Take 5 mg by mouth daily.    [provider]  metoprolol succinate (TOPROL-XL) 50 MG 24 hr tablet Take 1 tablet (50 mg total) by mouth daily. Patient taking  differently: Take 50 mg by mouth at bedtime.  10/04/16   Lelon Perla, MD  Omega-3 Fatty Acids (FISH OIL) 1200 MG CAPS Take 1,200 mg by mouth daily.     [provider]  Rivaroxaban (XARELTO) 15 MG TABS tablet Take 1 tablet (15 mg total) by mouth daily with supper. 01/25/17   Lelon Perla, MD  tamsulosin (FLOMAX) 0.4 MG CAPS capsule Take 0.8 mg by mouth daily.  04/29/13   [provider]    Family History Family History  Problem Relation Age of Onset  . Coronary artery disease Unknown        No family history    Social History Social History   Tobacco Use  . Smoking status: Former Smoker    Last attempt to quit: 10/17/1994    Years since quitting: 22.6  . Smokeless tobacco: Never Used  Substance Use Topics  . Alcohol use: Yes    Comment: Occasional  . Drug use: No     Allergies   Latex   Review of Systems Review of Systems  Constitutional: Negative for fever.  Gastrointestinal: Negative for abdominal pain.  Genitourinary: Positive for difficulty urinating. Negative for discharge, flank pain, penile pain and penile swelling.     Physical Exam Updated  Vital Signs BP (!) 145/54 (BP Location: Left Arm)   Pulse (!) 53   Temp 98.3 F (36.8 C) (Oral)   Resp 18   Ht 6\' 1"  (1.854 m)   Wt 90.7 kg (200 lb)   SpO2 100%   BMI 26.39 kg/m   Physical Exam  Constitutional: He appears well-developed.  Abdominal: He exhibits no mass. There is no tenderness.  Genitourinary:  Genitourinary Comments: Foley catheter in place.  Normal penis     ED Treatments / Results  Labs (all labs ordered are listed, but only abnormal results are displayed) Labs Reviewed - No data to display  EKG  EKG Interpretation None       Radiology No results found.  Procedures Procedures (including critical care time)  Medications Ordered in ED Medications - No data to display   Initial Impression / Assessment and Plan / ED Course  I have reviewed the triage  vital signs and the nursing notes.  Pertinent labs & imaging results that were available during my care of the patient were reviewed by me and considered in my medical decision making (see chart for details).     Patient with decreased output of Foley catheter.  Repositioning improved this however it has been in for 2 months and is not due to be changed again for another 2 months.  Will change the catheter out in case there is more of an obstruction in the catheter.  Discharge to follow-up with urology as needed.  Final Clinical Impressions(s) / ED Diagnoses   Final diagnoses:  Urinary retention  Obstruction of Foley catheter, initial encounter [facility])    ED Discharge Orders    None       Davonna Belling, MD 06/18/17 1542

## 2017-06-18 NOTE — ED Notes (Signed)
Pt given d/c instructions as per chart. Verbalizes understanding. No questions. 

## 2017-06-18 NOTE — ED Notes (Signed)
Pt states he had a catheter placed about a month ago and it stops draining occasionally. Stopped draining about 8a this morning and he is starting to become a little uncomfortable. Denies other s/s.

## 2017-06-18 NOTE — ED Triage Notes (Signed)
paitent states that he has had a catheter in for the last month. Reports that it stopped draining this am  - he states that he is starting to get uncomfortable

## 2017-07-06 ENCOUNTER — Telehealth: Payer: Self-pay | Admitting: Cardiology

## 2017-07-06 NOTE — Telephone Encounter (Signed)
Returned call. Pt did not answer after pickup and hung up phone. Attempt x2 w no response.

## 2017-07-06 NOTE — Telephone Encounter (Signed)
Patient calling, States that he was told that his AFIB is no longer evident. Patient would like to know if he needs to continue taking amlodipine, Metoprolol and Xarelto?

## 2017-07-10 NOTE — Telephone Encounter (Signed)
Left message for pt to call.

## 2017-07-12 NOTE — Telephone Encounter (Signed)
Spoke with pt, aware he will to continue all his medications as currently taking. Patient voiced understanding

## 2017-09-20 ENCOUNTER — Encounter: Payer: Self-pay | Admitting: Cardiology

## 2017-09-21 ENCOUNTER — Telehealth: Payer: Self-pay | Admitting: *Deleted

## 2017-09-21 NOTE — Telephone Encounter (Signed)
Clinical pharmacist to advise on Xarelto. Preop APP tomorrow will do final clearance.

## 2017-09-21 NOTE — Telephone Encounter (Signed)
   [facility] Pre-operative Risk Assessment    Request for surgical clearance:  1. What type of surgery is being performed? Robotic siple prostatectomy    2. When is this surgery scheduled? TBD   3. What type of clearance is required (medical clearance vs. Pharmacy clearance to hold med vs. Both)? both  4. Are there any medications that need to be held prior to surgery and how long? Xarelto    5. Practice name and name of physician performing surgery? [facility]-Dr. Alyson Ingles   6. What is your office phone and fax number? [phone]   7. Anesthesia type (None, local, MAC, general) ?    Kenijah Benningfield A Brax Walen 09/21/2017, 9:32 AM  _________________________________________________________________   (provider comments below)  Patient has OV with [facility] 09/27/17

## 2017-09-22 NOTE — Telephone Encounter (Signed)
I called the pt- he has a routine office visit scheduled for 3/13. We will address pre op clearance then, see pharmacist recommendations for holding Xarelto 3 days pre op.   Kerin Ransom PA-C 09/22/2017 3:17 PM

## 2017-09-22 NOTE — Telephone Encounter (Signed)
Pt takes Xarelto for afib with CHADS2VASc score of 4 (mild CAD, age x2, CHF- diastolic dysfunction). CrCl is 39mL/min. Recommend holding Xarelto for 3 days prior to procedure due to higher bleed risk of procedure and decreased renal function.

## 2017-09-25 NOTE — Progress Notes (Signed)
HPI: FU palpitations and atrial fibrillation. CardioNet in July of 2013 showed sinus bradycardia with occasional PAC. TSH 2.034. 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 Nuclear study December 2016 showed ejection fraction 53%, prior infarct but no ischemia. Patient found to be in atrial fibrillation following surgical procedure in July at [facility]. Echocardiogram July 2018 showed normal LV function, grade 2 diastolic dysfunction, mild mitral regurgitation and mild left atrial enlargement. Plan was to proceed with cardioversion if atrial fibrillation persists as this may have been postoperative atrial fibrillation. Pt in sinus at last ov. Since I last saw him,  he has dyspnea with more vigorous activities but not routine activities.  No orthopnea, PND, chest pain, palpitations or syncope.  He can climb 2 flights of stairs with no chest pain.  Current Outpatient Medications  Medication Sig Dispense Refill  . amLODipine (NORVASC) 10 MG tablet Take 1 tablet (10 mg total) by mouth daily.    . Cholecalciferol (VITAMIN D-3) 1000 UNITS CAPS Take 1,000 Units by mouth daily.     Marland Kitchen FIBER PO Take 8 g by mouth daily.     . finasteride (PROSCAR) 5 MG tablet Take 5 mg by mouth daily.    . metoprolol succinate (TOPROL-XL) 50 MG 24 hr tablet TAKE ONE TABLET BY MOUTH DAILY 90 tablet 8  . Omega-3 Fatty Acids (FISH OIL) 1200 MG CAPS Take 1,200 mg by mouth daily.     . PSYLLIUM HUSK PO Take 8 g by mouth daily.    . Rivaroxaban (XARELTO) 15 MG TABS tablet Take 1 tablet (15 mg total) by mouth daily with supper. 90 tablet 3  . tamsulosin (FLOMAX) 0.4 MG CAPS capsule Take 0.8 mg by mouth daily.      No current facility-administered medications for this visit.      Past Medical History:  Diagnosis Date  . Basal cell cancer   . BPH (benign prostatic hyperplasia)   . Gout   . Hypertension   . Renal insufficiency      Past Surgical History:  Procedure Laterality Date  . NOSE SURGERY    . Skin cancer removed    . TONSILLECTOMY    . TRANSURETHRAL RESECTION OF PROSTATE      Social History   Socioeconomic History  . Marital status: Married    Spouse name: Not on file  . Number of children: 6  . Years of education: Not on file  . Highest education level: Not on file  Social Needs  . Financial resource strain: Not on file  . Food insecurity - worry: Not on file  . Food insecurity - inability: Not on file  . Transportation needs - medical: Not on file  . Transportation needs - non-medical: Not on file  Occupational History    Comment: Engineer  Tobacco Use  . Smoking status: Former Smoker    Last attempt to quit: 10/17/1994    Years since quitting: 22.9  . Smokeless tobacco: Never Used  Substance and Sexual Activity  . Alcohol use: Yes    Comment: Occasional  . Drug use: No  . Sexual activity: Not on file  Other Topics Concern  . Not on file  Social History Narrative  . Not on file    Family History  Problem Relation Age of Onset  . Coronary artery disease Unknown        No family history    ROS:  Urinary retention but no fevers or chills, productive cough, hemoptysis, dysphasia, odynophagia, melena, hematochezia, dysuria, hematuria, rash, seizure activity, orthopnea, PND, pedal edema, claudication. Remaining systems are negative.  Physical Exam: Well-developed well-nourished in no acute distress.  Skin is warm and dry.  HEENT is normal.  Neck is supple.  Chest is clear to auscultation with normal expansion.  Cardiovascular exam is regular rate and rhythm.  Abdominal exam nontender or distended. No masses palpated. Extremities show trace edema. neuro grossly intact  A/P  1 paroxysmal atrial fibrillation-patient remains in sinus rhythm on examination.  Continue metoprolol for rate control if atrial fibrillation recurs.  Continue Xarelto.  2 hypertension-blood pressure is  controlled.  Continue present medications.  3 palpitations-continue beta-blocker.  4 preoperative evaluation-patient will require prostate surgery.  He has good functional capacity with no symptoms.  He may proceed without further ischemia evaluation.  Hold Xarelto 3 days prior to procedure and 2-3 days afterwards or until hemostasis achieved.  Kirk Ruths, MD

## 2017-09-27 ENCOUNTER — Ambulatory Visit: Payer: Medicare Other | Admitting: Cardiology

## 2017-09-27 ENCOUNTER — Other Ambulatory Visit: Payer: Self-pay | Admitting: Cardiology

## 2017-09-27 ENCOUNTER — Encounter: Payer: Self-pay | Admitting: Cardiology

## 2017-09-27 VITALS — BP 129/65 | HR 53 | Ht 73.0 in | Wt 193.8 lb

## 2017-09-27 DIAGNOSIS — I480 Paroxysmal atrial fibrillation: Secondary | ICD-10-CM | POA: Diagnosis not present

## 2017-09-27 DIAGNOSIS — I10 Essential (primary) hypertension: Secondary | ICD-10-CM

## 2017-09-27 DIAGNOSIS — Z01810 Encounter for preprocedural cardiovascular examination: Secondary | ICD-10-CM | POA: Diagnosis not present

## 2017-09-27 DIAGNOSIS — R002 Palpitations: Secondary | ICD-10-CM | POA: Diagnosis not present

## 2017-09-27 NOTE — Patient Instructions (Signed)
Medication Instructions:  Your physician recommends that you continue on your current medications as directed. Please refer to the Current Medication list given to you today.   Labwork: -None  Testing/Procedures: -None  Follow-Up: Your physician recommends that you keep your scheduled  follow-up appointment with Dr. Stanford Breed in June.   Any Other Special Instructions Will Be Listed Below (If Applicable).     If you need a refill on your cardiac medications before your next appointment, please call your pharmacy.

## 2017-10-04 ENCOUNTER — Other Ambulatory Visit: Payer: Self-pay | Admitting: Urology

## 2017-11-20 ENCOUNTER — Encounter (HOSPITAL_COMMUNITY): Payer: Self-pay | Admitting: *Deleted

## 2017-12-06 NOTE — Progress Notes (Signed)
Lov/ cardiac clearance Dr.Brian Crenshaw 09-27-17 epic   EKG  04-17-17 epic   ECHO 02-01-17 Epic

## 2017-12-06 NOTE — Patient Instructions (Addendum)
Richard Vincent.  12/06/2017   Your procedure is scheduled on: 12-14-17   Report to [facility] Main  Entrance    Report to admitting at 8:30AM     Call this number if you have problems the morning of surgery [phone]     Remember: Do not eat food After Midnight ON Tuesday 12-12-17. DRINK PLENTY OF CLEAR LIQUIDS ALL DAY Wednesday 12-13-17 AND FOLLOW ALL BOWEL PREP ORDERS PROVIDED BY YOUR SURGEON. NOTHING BY MOUTH AFTER MIDNIGHT ON Wednesday!   CLEAR LIQUID DIET   Foods Allowed                                                                     Foods Excluded  Coffee and tea, regular and decaf                             liquids that you cannot  Plain Jell-O in any flavor                                             see through such as: Fruit ices (not with fruit pulp)                                     milk, soups, orange juice  Iced Popsicles                                    All solid food Carbonated beverages, regular and diet                                    Cranberry, grape and apple juices Sports drinks like Gatorade Lightly seasoned clear broth or consume(fat free) Sugar, honey syrup  Sample Menu Breakfast                                Lunch                                     Supper Cranberry juice                    Beef broth                            Chicken broth Jell-O                                     Grape juice  Apple juice Coffee or tea                        Jell-O                                      Popsicle                                                Coffee or tea                        Coffee or tea       Take these medicines the morning of surgery with A SIP OF WATER: AMLODIPINE, METOPROLOL                                 You may not have any metal on your body including hair pins and              piercings  Do not wear jewelry, make-up, lotions, powders or perfumes, deodorant                       Men may shave face and neck.   Do not bring valuables to the hospital. [place].  Contacts, dentures or bridgework may not be worn into surgery.      Patients discharged the day of surgery will not be allowed to drive home.  Name and phone number of your driver:  Special Instructions: N/A              Please read over the following fact sheets you were given:     _____________________________________________________________________  [facility] - Preparing for Surgery Before surgery, you can play an important role.  Because skin is not sterile, your skin needs to be as free of germs as possible.  You can reduce the number of germs on your skin by washing with CHG (chlorahexidine gluconate) soap before surgery.  CHG is an antiseptic cleaner which kills germs and bonds with the skin to continue killing germs even after washing. Please DO NOT use if you have an allergy to CHG or antibacterial soaps.  If your skin becomes reddened/irritated stop using the CHG and inform your nurse when you arrive at Short Stay. Do not shave (including legs and underarms) for at least 48 hours prior to the first CHG shower.  You may shave your face/neck. Please follow these instructions carefully:  1.  Shower with CHG Soap the night before surgery and the  morning of Surgery.  2.  If you choose to wash your hair, wash your hair first as usual with your  normal  shampoo.  3.  After you shampoo, rinse your hair and body thoroughly to remove the  shampoo.                           4.  Use CHG as you would any other liquid soap.  You can apply chg directly  to the skin and  wash                       Gently with a scrungie or clean washcloth.  5.  Apply the CHG Soap to your body ONLY FROM THE NECK DOWN.   Do not use on face/ open                           Wound or open sores. Avoid contact with eyes, ears mouth and genitals (private parts).                        Wash face,  Genitals (private parts) with your normal soap.             6.  Wash thoroughly, paying special attention to the area where your surgery  will be performed.  7.  Thoroughly rinse your body with warm water from the neck down.  8.  DO NOT shower/wash with your normal soap after using and rinsing off  the CHG Soap.                9.  Pat yourself dry with a clean towel.            10.  Wear clean pajamas.            11.  Place clean sheets on your bed the night of your first shower and do not  sleep with pets. Day of Surgery : Do not apply any lotions/deodorants the morning of surgery.  Please wear clean clothes to the hospital/surgery center.  FAILURE TO FOLLOW THESE INSTRUCTIONS MAY RESULT IN THE CANCELLATION OF YOUR SURGERY PATIENT SIGNATURE_________________________________  NURSE SIGNATURE__________________________________  ________________________________________________________________________

## 2017-12-07 ENCOUNTER — Encounter (HOSPITAL_COMMUNITY)
Admission: RE | Admit: 2017-12-07 | Discharge: 2017-12-07 | Disposition: A | Discharge status: Home / Self Care | Payer: Medicare Other | Source: Ambulatory Visit | Attending: Urology | Admitting: Urology

## 2017-12-07 ENCOUNTER — Other Ambulatory Visit: Payer: Self-pay

## 2017-12-07 ENCOUNTER — Encounter (HOSPITAL_COMMUNITY): Payer: Self-pay

## 2017-12-07 DIAGNOSIS — Z01812 Encounter for preprocedural laboratory examination: Secondary | ICD-10-CM | POA: Insufficient documentation

## 2017-12-07 DIAGNOSIS — Z0183 Encounter for blood typing: Secondary | ICD-10-CM | POA: Diagnosis not present

## 2017-12-07 DIAGNOSIS — N400 Enlarged prostate without lower urinary tract symptoms: Secondary | ICD-10-CM | POA: Diagnosis not present

## 2017-12-07 HISTORY — DX: Unspecified asthma, uncomplicated: J45.909

## 2017-12-07 HISTORY — DX: Paroxysmal atrial fibrillation: I48.0

## 2017-12-07 HISTORY — DX: Gastro-esophageal reflux disease without esophagitis: K21.9

## 2017-12-07 LAB — CBC
HCT: 40.3 % (ref 39.0–52.0)
Hemoglobin: 13.2 g/dL (ref 13.0–17.0)
MCH: 30.6 pg (ref 26.0–34.0)
MCHC: 32.8 g/dL (ref 30.0–36.0)
MCV: 93.3 fL (ref 78.0–100.0)
PLATELETS: 199 10*3/uL (ref 150–400)
RBC: 4.32 MIL/uL (ref 4.22–5.81)
RDW: 13 % (ref 11.5–15.5)
WBC: 6.2 10*3/uL (ref 4.0–10.5)

## 2017-12-07 LAB — ABO/RH: ABO/RH(D): B POS

## 2017-12-07 LAB — BASIC METABOLIC PANEL
Anion gap: 8 (ref 5–15)
BUN: 35 mg/dL — ABNORMAL HIGH (ref 6–20)
CALCIUM: 8.8 mg/dL — AB (ref 8.9–10.3)
CO2: 20 mmol/L — ABNORMAL LOW (ref 22–32)
Chloride: 111 mmol/L (ref 101–111)
Creatinine, Ser: 1.7 mg/dL — ABNORMAL HIGH (ref 0.61–1.24)
GFR calc Af Amer: 41 mL/min — ABNORMAL LOW (ref >60)
GFR, EST NON AFRICAN AMERICAN: 35 mL/min — AB (ref >60)
GLUCOSE: 91 mg/dL (ref 65–99)
Potassium: 5.1 mmol/L (ref 3.5–5.1)
Sodium: 139 mmol/L (ref 135–145)

## 2017-12-07 NOTE — Progress Notes (Signed)
BMP SENT VIA Epic FAX TO DR P. Alyson Ingles

## 2017-12-13 ENCOUNTER — Encounter: Payer: Self-pay | Admitting: Cardiology

## 2017-12-14 ENCOUNTER — Other Ambulatory Visit: Payer: Self-pay

## 2017-12-14 ENCOUNTER — Inpatient Hospital Stay (HOSPITAL_COMMUNITY)
Admission: AD | Admit: 2017-12-14 | Discharge: 2017-12-16 | DRG: 707 | Disposition: A | Discharge status: Home / Self Care | Payer: Medicare Other | Attending: Urology | Admitting: Urology

## 2017-12-14 ENCOUNTER — Encounter (HOSPITAL_COMMUNITY)
Admission: AD | Disposition: A | Discharge status: Home / Self Care | Payer: Self-pay | Source: Home / Self Care | Attending: Urology

## 2017-12-14 ENCOUNTER — Ambulatory Visit (HOSPITAL_COMMUNITY): Payer: Medicare Other | Admitting: Anesthesiology

## 2017-12-14 ENCOUNTER — Encounter (HOSPITAL_COMMUNITY): Payer: Self-pay

## 2017-12-14 DIAGNOSIS — I10 Essential (primary) hypertension: Secondary | ICD-10-CM | POA: Diagnosis present

## 2017-12-14 DIAGNOSIS — K219 Gastro-esophageal reflux disease without esophagitis: Secondary | ICD-10-CM | POA: Diagnosis present

## 2017-12-14 DIAGNOSIS — R338 Other retention of urine: Secondary | ICD-10-CM | POA: Diagnosis present

## 2017-12-14 DIAGNOSIS — Z87891 Personal history of nicotine dependence: Secondary | ICD-10-CM | POA: Diagnosis not present

## 2017-12-14 DIAGNOSIS — J45909 Unspecified asthma, uncomplicated: Secondary | ICD-10-CM | POA: Diagnosis present

## 2017-12-14 DIAGNOSIS — N401 Enlarged prostate with lower urinary tract symptoms: Secondary | ICD-10-CM | POA: Diagnosis present

## 2017-12-14 DIAGNOSIS — N138 Other obstructive and reflux uropathy: Secondary | ICD-10-CM | POA: Diagnosis present

## 2017-12-14 DIAGNOSIS — I480 Paroxysmal atrial fibrillation: Secondary | ICD-10-CM | POA: Diagnosis present

## 2017-12-14 HISTORY — PX: XI ROBOTIC ASSISTED SIMPLE PROSTATECTOMY: SHX6713

## 2017-12-14 LAB — HEMOGLOBIN AND HEMATOCRIT, BLOOD
HCT: 39.6 % (ref 39.0–52.0)
Hemoglobin: 12.8 g/dL — ABNORMAL LOW (ref 13.0–17.0)

## 2017-12-14 LAB — TYPE AND SCREEN
ABO/RH(D): B POS
Antibody Screen: NEGATIVE

## 2017-12-14 SURGERY — PROSTATECTOMY, SIMPLE, ROBOT-ASSISTED
Anesthesia: General | Site: Prostate

## 2017-12-14 MED ORDER — SULFAMETHOXAZOLE-TRIMETHOPRIM 800-160 MG PO TABS: 1.0000 | ORAL_TABLET | Freq: Two times a day (BID) | ORAL | 0 refills | Status: DC

## 2017-12-14 MED ORDER — LACTATED RINGERS IR SOLN
Status: DC | PRN
  Administered 2017-12-14: 1000 mL

## 2017-12-14 MED ORDER — GLYCOPYRROLATE PF 0.2 MG/ML IJ SOSY
PREFILLED_SYRINGE | INTRAMUSCULAR | Status: DC | PRN
  Administered 2017-12-14 (×2): .2 mg via INTRAVENOUS

## 2017-12-14 MED ORDER — DIPHENHYDRAMINE HCL 12.5 MG/5ML PO ELIX: 12.5000 mg | ORAL_SOLUTION | Freq: Four times a day (QID) | ORAL | Status: DC | PRN

## 2017-12-14 MED ORDER — FENTANYL CITRATE (PF) 100 MCG/2ML IJ SOLN
INTRAMUSCULAR | Status: DC | PRN
  Administered 2017-12-14 (×5): 50 ug via INTRAVENOUS

## 2017-12-14 MED ORDER — PROMETHAZINE HCL 25 MG/ML IJ SOLN: 6.2500 mg | INTRAMUSCULAR | Status: DC | PRN

## 2017-12-14 MED ORDER — ACETAMINOPHEN 325 MG PO TABS
650.0000 mg | ORAL_TABLET | ORAL | Status: DC | PRN
  Administered 2017-12-16 (×2): 650 mg via ORAL
  Filled 2017-12-14 (×2): qty 2

## 2017-12-14 MED ORDER — ROCURONIUM BROMIDE 10 MG/ML (PF) SYRINGE
PREFILLED_SYRINGE | INTRAVENOUS | Status: DC | PRN
  Administered 2017-12-14: 10 mg via INTRAVENOUS
  Administered 2017-12-14: 60 mg via INTRAVENOUS
  Administered 2017-12-14: 10 mg via INTRAVENOUS

## 2017-12-14 MED ORDER — BUPIVACAINE LIPOSOME 1.3 % IJ SUSP
20.0000 mL | Freq: Once | INTRAMUSCULAR | Status: AC
  Administered 2017-12-14: 20 mL
  Filled 2017-12-14: qty 20

## 2017-12-14 MED ORDER — LACTATED RINGERS IV SOLN
INTRAVENOUS | Status: DC
  Administered 2017-12-14: 1000 mL via INTRAVENOUS
  Administered 2017-12-14: 14:00:00 via INTRAVENOUS

## 2017-12-14 MED ORDER — LIDOCAINE 2% (20 MG/ML) 5 ML SYRINGE
INTRAMUSCULAR | Status: AC
  Filled 2017-12-14: qty 5

## 2017-12-14 MED ORDER — PROPOFOL 10 MG/ML IV BOLUS
INTRAVENOUS | Status: DC | PRN
  Administered 2017-12-14: 20 mg via INTRAVENOUS
  Administered 2017-12-14: 160 mg via INTRAVENOUS

## 2017-12-14 MED ORDER — DIPHENHYDRAMINE HCL 50 MG/ML IJ SOLN: 12.5000 mg | Freq: Four times a day (QID) | INTRAMUSCULAR | Status: DC | PRN

## 2017-12-14 MED ORDER — FENTANYL CITRATE (PF) 250 MCG/5ML IJ SOLN
INTRAMUSCULAR | Status: AC
  Filled 2017-12-14: qty 5

## 2017-12-14 MED ORDER — PSYLLIUM 95 % PO PACK
1.0000 | PACK | Freq: Every day | ORAL | Status: DC
  Administered 2017-12-15 – 2017-12-16 (×2): 1 via ORAL
  Filled 2017-12-14 (×2): qty 1

## 2017-12-14 MED ORDER — ROCURONIUM BROMIDE 10 MG/ML (PF) SYRINGE
PREFILLED_SYRINGE | INTRAVENOUS | Status: AC
  Filled 2017-12-14: qty 5

## 2017-12-14 MED ORDER — SODIUM CHLORIDE 0.9 % IJ SOLN
INTRAMUSCULAR | Status: DC | PRN
  Administered 2017-12-14: 20 mL

## 2017-12-14 MED ORDER — AMLODIPINE BESYLATE 10 MG PO TABS
10.0000 mg | ORAL_TABLET | Freq: Every day | ORAL | Status: DC
  Administered 2017-12-15 – 2017-12-16 (×2): 10 mg via ORAL
  Filled 2017-12-14 (×2): qty 1

## 2017-12-14 MED ORDER — ONDANSETRON HCL 4 MG/2ML IJ SOLN: 4.0000 mg | INTRAMUSCULAR | Status: DC | PRN

## 2017-12-14 MED ORDER — MAGNESIUM CITRATE PO SOLN: 1.0000 | Freq: Once | ORAL | Status: DC

## 2017-12-14 MED ORDER — DEXAMETHASONE SODIUM PHOSPHATE 10 MG/ML IJ SOLN
INTRAMUSCULAR | Status: AC
  Filled 2017-12-14: qty 1

## 2017-12-14 MED ORDER — SUGAMMADEX SODIUM 200 MG/2ML IV SOLN
INTRAVENOUS | Status: DC | PRN
  Administered 2017-12-14: 200 mg via INTRAVENOUS

## 2017-12-14 MED ORDER — SODIUM CHLORIDE 0.9 % IJ SOLN
INTRAMUSCULAR | Status: AC
  Filled 2017-12-14: qty 20

## 2017-12-14 MED ORDER — HYDROMORPHONE HCL 1 MG/ML IJ SOLN: 0.5000 mg | INTRAMUSCULAR | Status: DC | PRN

## 2017-12-14 MED ORDER — PROPOFOL 10 MG/ML IV BOLUS
INTRAVENOUS | Status: AC
  Filled 2017-12-14: qty 20

## 2017-12-14 MED ORDER — DEXAMETHASONE SODIUM PHOSPHATE 10 MG/ML IJ SOLN
INTRAMUSCULAR | Status: DC | PRN
  Administered 2017-12-14: 10 mg via INTRAVENOUS

## 2017-12-14 MED ORDER — HYDROMORPHONE HCL 1 MG/ML IJ SOLN: 0.2500 mg | INTRAMUSCULAR | Status: DC | PRN

## 2017-12-14 MED ORDER — SUGAMMADEX SODIUM 200 MG/2ML IV SOLN
INTRAVENOUS | Status: AC
  Filled 2017-12-14: qty 2

## 2017-12-14 MED ORDER — LIDOCAINE 2% (20 MG/ML) 5 ML SYRINGE
INTRAMUSCULAR | Status: DC | PRN
  Administered 2017-12-14: 100 mg via INTRAVENOUS

## 2017-12-14 MED ORDER — STERILE WATER FOR IRRIGATION IR SOLN
Status: DC | PRN
  Administered 2017-12-14: 1000 mL

## 2017-12-14 MED ORDER — SODIUM CHLORIDE 0.9 % IV BOLUS
1000.0000 mL | Freq: Once | INTRAVENOUS | Status: AC
  Administered 2017-12-14: 1000 mL via INTRAVENOUS

## 2017-12-14 MED ORDER — ONDANSETRON HCL 4 MG/2ML IJ SOLN
INTRAMUSCULAR | Status: AC
  Filled 2017-12-14: qty 2

## 2017-12-14 MED ORDER — CEFAZOLIN SODIUM-DEXTROSE 2-4 GM/100ML-% IV SOLN
INTRAVENOUS | Status: AC
  Filled 2017-12-14: qty 100

## 2017-12-14 MED ORDER — CEFAZOLIN SODIUM-DEXTROSE 2-4 GM/100ML-% IV SOLN
2.0000 g | INTRAVENOUS | Status: AC
  Administered 2017-12-14: 2 g via INTRAVENOUS

## 2017-12-14 MED ORDER — HYDROCODONE-ACETAMINOPHEN 5-325 MG PO TABS: 1.0000 | ORAL_TABLET | Freq: Four times a day (QID) | ORAL | 0 refills | Status: DC | PRN

## 2017-12-14 MED ORDER — ONDANSETRON HCL 4 MG/2ML IJ SOLN
INTRAMUSCULAR | Status: DC | PRN
  Administered 2017-12-14: 4 mg via INTRAVENOUS

## 2017-12-14 MED ORDER — DEXTROSE-NACL 5-0.45 % IV SOLN
INTRAVENOUS | Status: DC
  Administered 2017-12-14 – 2017-12-16 (×3): via INTRAVENOUS

## 2017-12-14 MED ORDER — EPHEDRINE SULFATE-NACL 50-0.9 MG/10ML-% IV SOSY
PREFILLED_SYRINGE | INTRAVENOUS | Status: DC | PRN
  Administered 2017-12-14 (×2): 5 mg via INTRAVENOUS
  Administered 2017-12-14: 10 mg via INTRAVENOUS
  Administered 2017-12-14: 5 mg via INTRAVENOUS
  Administered 2017-12-14: 10 mg via INTRAVENOUS

## 2017-12-14 MED ORDER — METOPROLOL SUCCINATE ER 50 MG PO TB24
50.0000 mg | ORAL_TABLET | Freq: Every day | ORAL | Status: DC
  Administered 2017-12-15 – 2017-12-16 (×2): 50 mg via ORAL
  Filled 2017-12-14 (×2): qty 1

## 2017-12-14 MED ORDER — OXYCODONE HCL 5 MG PO TABS: 5.0000 mg | ORAL_TABLET | ORAL | Status: DC | PRN

## 2017-12-14 MED ORDER — FINASTERIDE 5 MG PO TABS
5.0000 mg | ORAL_TABLET | Freq: Every evening | ORAL | Status: DC
  Administered 2017-12-14 – 2017-12-15 (×2): 5 mg via ORAL
  Filled 2017-12-14 (×2): qty 1

## 2017-12-14 SURGICAL SUPPLY — 53 items
ADH SKN CLS APL DERMABOND .7 (GAUZE/BANDAGES/DRESSINGS) ×1
CATH FOLEY 3WAY 30CC 22FR (CATHETERS) IMPLANT
CATH SILICONE 22FR 30CC 3WAY (CATHETERS) ×2 IMPLANT
CHLORAPREP W/TINT 26ML (MISCELLANEOUS) ×3 IMPLANT
CLOTH BEACON ORANGE TIMEOUT ST (SAFETY) ×3 IMPLANT
COVER SURGICAL LIGHT HANDLE (MISCELLANEOUS) ×3 IMPLANT
COVER TIP SHEARS 8 DVNC (MISCELLANEOUS) ×1 IMPLANT
COVER TIP SHEARS 8MM DA VINCI (MISCELLANEOUS) ×2
DECANTER SPIKE VIAL GLASS SM (MISCELLANEOUS) ×3 IMPLANT
DERMABOND ADVANCED (GAUZE/BANDAGES/DRESSINGS) ×2
DERMABOND ADVANCED .7 DNX12 (GAUZE/BANDAGES/DRESSINGS) ×1 IMPLANT
DRAPE ARM DVNC X/XI (DISPOSABLE) ×4 IMPLANT
DRAPE COLUMN DVNC XI (DISPOSABLE) ×1 IMPLANT
DRAPE DA VINCI XI ARM (DISPOSABLE) ×8
DRAPE DA VINCI XI COLUMN (DISPOSABLE) ×2
DRAPE SURG IRRIG POUCH 19X23 (DRAPES) ×3 IMPLANT
DRSG TEGADERM 4X4.75 (GAUZE/BANDAGES/DRESSINGS) ×3 IMPLANT
ELECT REM PT RETURN 15FT ADLT (MISCELLANEOUS) ×3 IMPLANT
GAUZE SPONGE 2X2 8PLY STRL LF (GAUZE/BANDAGES/DRESSINGS) ×1 IMPLANT
GLOVE BIO SURGEON STRL SZ 6.5 (GLOVE) ×2 IMPLANT
GLOVE BIO SURGEON STRL SZ8 (GLOVE) ×6 IMPLANT
GLOVE BIO SURGEONS STRL SZ 6.5 (GLOVE) ×1
GLOVE BIOGEL PI IND STRL 8 (GLOVE) ×2 IMPLANT
GLOVE BIOGEL PI INDICATOR 8 (GLOVE) ×4
GOWN STRL REUS W/TWL LRG LVL3 (GOWN DISPOSABLE) ×9 IMPLANT
HOLDER FOLEY CATH W/STRAP (MISCELLANEOUS) ×3 IMPLANT
IRRIG SUCT STRYKERFLOW 2 WTIP (MISCELLANEOUS) ×3
IRRIGATION SUCT STRKRFLW 2 WTP (MISCELLANEOUS) ×1 IMPLANT
IV LACTATED RINGERS 1000ML (IV SOLUTION) ×3 IMPLANT
NDL INSUFFLATION 14GA 120MM (NEEDLE) ×1 IMPLANT
NEEDLE INSUFFLATION 14GA 120MM (NEEDLE) ×3 IMPLANT
PACK ROBOT UROLOGY CUSTOM (CUSTOM PROCEDURE TRAY) ×3 IMPLANT
PAD POSITIONING PINK XL (MISCELLANEOUS) ×3 IMPLANT
SEAL CANN UNIV 5-8 DVNC XI (MISCELLANEOUS) ×4 IMPLANT
SEAL XI 5MM-8MM UNIVERSAL (MISCELLANEOUS) ×8
SET IRRIG Y TYPE TUR BLADDER L (SET/KITS/TRAYS/PACK) IMPLANT
SOLUTION ELECTROLUBE (MISCELLANEOUS) ×3 IMPLANT
SPONGE GAUZE 2X2 STER 10/PKG (GAUZE/BANDAGES/DRESSINGS) ×2
SPONGE LAP 4X18 RFD (DISPOSABLE) IMPLANT
SUT ETHILON 3 0 PS 1 (SUTURE) ×3 IMPLANT
SUT MNCRL AB 4-0 PS2 18 (SUTURE) ×6 IMPLANT
SUT V-LOC BARB 180 2/0GR6 GS22 (SUTURE) ×6
SUT VIC AB 0 CT1 27 (SUTURE) ×15
SUT VIC AB 0 CT1 27XBRD ANTBC (SUTURE) ×3 IMPLANT
SUT VIC AB 2-0 SH 27 (SUTURE)
SUT VIC AB 2-0 SH 27X BRD (SUTURE) IMPLANT
SUT VICRYL 0 UR6 27IN ABS (SUTURE) ×7 IMPLANT
SUT VLOC BARB 180 ABS3/0GR12 (SUTURE)
SUTURE V-LC BRB 180 2/0GR6GS22 (SUTURE) ×2 IMPLANT
SUTURE VLOC BRB 180 ABS3/0GR12 (SUTURE) IMPLANT
TOWEL OR 17X26 10 PK STRL BLUE (TOWEL DISPOSABLE) ×3 IMPLANT
TOWEL OR NON WOVEN STRL DISP B (DISPOSABLE) ×3 IMPLANT
WATER STERILE IRR 1000ML POUR (IV SOLUTION) ×3 IMPLANT

## 2017-12-14 NOTE — Discharge Instructions (Signed)
1.  Activity:  You are encouraged to ambulate frequently (about every hour during waking hours) to help prevent blood clots from forming in your legs or lungs.  However, you should not engage in any heavy lifting (> 10-15 lbs), strenuous activity, or straining. 2. Diet: You should advance your diet as instructed by your physician.  It will be normal to have some bloating, nausea, and abdominal discomfort intermittently. 3. Prescriptions:  You will be provided a prescription for pain medication to take as needed.  If your pain is not severe enough to require the prescription pain medication, you may take extra strength Tylenol instead which will have less side effects.  You should also take a prescribed stool softener to avoid straining with bowel movements as the prescription pain medication may constipate you. 4. Incisions: You may remove your dressing bandages 48 hours after surgery if not removed in the hospital.  You will either have some small staples or special tissue glue at each of the incision sites. Once the bandages are removed (if present), the incisions may stay open to air.  You may start showering (but not soaking or bathing in water) the 2nd day after surgery and the incisions simply need to be patted dry after the shower.  No additional care is needed. 5. What to call us about: You should call the office [phone]) if you develop fever > 101 or develop persistent vomiting.   You may resume aspirin, advil, aleve, vitamins, and supplements 7 days after surgery.  You may resume Xarelto 3 days after surgery.

## 2017-12-14 NOTE — Transfer of Care (Signed)
Immediate Anesthesia Transfer of Care Note  Patient: Richard Vincent.  Procedure(s) Performed: XI ROBOTIC ASSISTED SIMPLE PROSTATECTOMY (N/A Prostate)  Patient Location: PACU  Anesthesia Type:General  Level of Consciousness: awake, alert  and oriented  Airway & Oxygen Therapy: Patient Spontanous Breathing and Patient connected to face mask oxygen  Post-op Assessment: Report given to RN and Post -op Vital signs reviewed and stable  Post vital signs: Reviewed and stable  Last Vitals:  Vitals Value Taken Time  BP 145/79 12/14/2017  2:19 PM  Temp    Pulse 81 12/14/2017  2:22 PM  Resp 16 12/14/2017  2:22 PM  SpO2 99 % 12/14/2017  2:22 PM  Vitals shown include unvalidated device data.  Last Pain:  Vitals:   12/14/17 0851  TempSrc:   PainSc: 0-No pain      Patients Stated Pain Goal: 4 (29/09/03 0149)  Complications: No apparent anesthesia complications

## 2017-12-14 NOTE — Op Note (Signed)
PREOPERATIVE DIAGNOSIS: BPH with urinary retention  POSTOPERATIVE DIAGNOSIS: Same  PROCEDURES: 1. Robotic-assisted laparoscopic simple prostatectomy.  ANESTHESIA: General  ATTENDING: Nicolette Bang, MD  ASSISTANT: Debbrah Alar, PA  ESTIMATED BLOOD LOSS: 250 mL.  COMPLICATIONS: None.  SPECIMEN: 1.prostatic adenoma  ANTIBIOTICS: ancef  FINDINGS: 3cm intravesical prostatic protrusion. Ureteral orifices in normal anatomic location. No leaks from cystotomy at 150cc of water.  The assistant was utilized for retraction, suction, and passing sutures  DRAINS: 1. Jackson-Pratt drain to bulb suction. 2. Foley catheter to straight drain.  INDICATION: Richard Vincent is a very pleasant 84 year old gentleman, who has BPH with significant LUTS including urinary retention. His TRUS volume is 160cc.  Options were discussed with the patient in detail for primary manage including continued surveillance protocols versus surgical extirpation with and without minimally invasive assistance and he wished to proceed with robotic simple prostatectomy. Informed consent was obtained and placed in the medical record.  PROCEDURE IN DETAIL: The patient was brought to the operating room and a breif timeout was down to ensure correct patient, correct procedure, and correct site. Intravenous antibiotics were administered. General endotracheal anesthesia was introduced. The patient was placed into a low lithotomy position after tucking his arms with foam padding, placing on a pink and non-slide foam pad. A test of steep Trendelenburg positioning was performed and he was found to be suitably positioned. Sterile field was created by prepping and draping the patient's penis, perineum and proximal thighs using iodine and his infra-xiphoid abdomen using chlorhexidine gluconate. Next, a high-flow, low-pressure pneumoperitoneum was obtained using Veress technique in the infraumbilical midline having passed  the aspiration and drop test. Next, a 8-mm robotic camera port was placed in the same location. Laparoscopic examination of the peritoneal cavity revealed no significant adhesions and no visceral injury. Additional ports were placed as follows: Right paramedian 8-mm robotic port, right far lateral 12-mm assistant port, left paramedian 8-mm robotic port, left far lateral 8-mm robotic port, and right paramedian 5-mm suction port. Robot was docked and passed through the electronic checks. Next, attention was directed for the development of space of Retzius. Incision was made lateral to the left medial umbilical ligament from the midline towards the area of the internal ring and coursing along the iliac vessels towards the area of the ureter, which was positively identified. The left bladder was dissected away from the pelvic sidewall towards the area of the endopelvic fascia. A mirror-imaged dissection was performed on the right side.  Additional anterior attachments were taken down using cautery scissors. Next, the bladder neck was identified moving the Foley catheter back and forth. We then made a 6cm transverse cystotomy 3cm from the bladder neck. We identified the ureteral orifices and care was taken to exclude then from the dissection. We then placed 3 holding stitches in the anterior bladder wall and secured it to Coopers ligament.  We then made a circumscribing incision around the base of the prostate. We then used a 0 vicryl in a figure of eight fashion in the base of the adenoma for traction. We proceeded with a posterior dissection of the prostate until we identified the capsule. We then used a combination of electrocautery, blunt and sharp dissection to free the adenoma from the capsule. We then dissected laterally to the apex. Individual bleeders were cauterized. We then dissected anteriorly along the capsule until we reached the apex. The adenoma was then freed and placed in an  endocatch bag. We noted good hemostasis and no additional sutures were placed.  A Foley catheter was then placed per urethra easily. We then tacked down the bladder neck to the prostatic fossa with a single interrupted 2-0 vicryl. We then proceeded to closed the cystotomy. We closed the bladder with a running 0 vicryl full thickness. We then performed a imbricating second layer  Closure with 0 vicryl. The bladder was then filled with 150cc of water and we noted no leak.  All sponge and needle counts were correct. A closed suction drain was brought to the previous left lateral robotic port site into the area of the peritoneal cavity. The previous right 12-mm assistant port was closed at the level of the fascia using a Carter-Thomason suture passer under laparoscopic vision. Robot was undocked. Specimen was retrieved by extending the previous camera port site inferiorly for distance approximately 3 cm and removing the prostatectomy specimens and setting aside for permanent pathology. The site was closed at the level of fascia using figure-of-eight 0 vicryl followed by reapproximation of the Scarpa's using running Vicryl. All incision sites were infiltrated with dilute Lyophilized Marcaine and closed at the level of the skin using subcuticular Monocryl followed by Dermabond. Procedure was then terminated. The patient tolerated the procedure well. There were no immediate periprocedural complications and the patient was taken to the postanesthesia care unit in stable Condition.  COMPLICATIONS: None  CONDITION: Stable, extubated, transferred to PACU  PLAN: The patient will be admitted for 1-2 for hydration, post operative monitoring and pain control. He will be discharged home with foley in place and foley will be removed in 14 days, He will have a cystogram prior to foley catheter removal

## 2017-12-14 NOTE — Anesthesia Procedure Notes (Signed)
Procedure Name: Intubation Date/Time: 12/14/2017 11:10 AM Performed by: Declin Rajan D, CRNA Pre-anesthesia Checklist: Patient identified, Emergency Drugs available, Suction available and Patient being monitored Patient Re-evaluated:Patient Re-evaluated prior to induction Oxygen Delivery Method: Circle system utilized Preoxygenation: Pre-oxygenation with 100% oxygen Induction Type: IV induction Ventilation: Mask ventilation without difficulty Laryngoscope Size: Mac and 4 Grade View: Grade I Tube type: Oral Number of attempts: 1 Airway Equipment and Method: Stylet Placement Confirmation: ETT inserted through vocal cords under direct vision,  positive ETCO2 and breath sounds checked- equal and bilateral Secured at: 22 cm Tube secured with: Tape Dental Injury: Teeth and Oropharynx as per pre-operative assessment

## 2017-12-14 NOTE — Anesthesia Preprocedure Evaluation (Signed)
Anesthesia Evaluation  Patient identified by MRN, date of birth, ID band Patient awake    Reviewed: Allergy & Precautions, NPO status , Patient's Chart, lab work & pertinent test results  Airway Mallampati: II  TM Distance: >3 FB Neck ROM: Full    Dental no notable dental hx.    Pulmonary neg pulmonary ROS, former smoker,    Pulmonary exam normal breath sounds clear to auscultation       Cardiovascular hypertension, + dysrhythmias Atrial Fibrillation  Rhythm:Regular Rate:Bradycardia + Systolic murmurs Paroxysmal A-fib (HCC)  occurred post-operatively ; last EKG he was in NSR     Neuro/Psych negative neurological ROS  negative psych ROS   GI/Hepatic negative GI ROS, Neg liver ROS,   Endo/Other  negative endocrine ROS  Renal/GU Renal InsufficiencyRenal disease  negative genitourinary   Musculoskeletal negative musculoskeletal ROS (+)   Abdominal   Peds negative pediatric ROS (+)  Hematology negative hematology ROS (+)   Anesthesia Other Findings   Reproductive/Obstetrics negative OB ROS                             Anesthesia Physical Anesthesia Plan  ASA: III  Anesthesia Plan: General   Post-op Pain Management:    Induction: Intravenous  PONV Risk Score and Plan: 2 and Ondansetron, Dexamethasone and Treatment may vary due to age or medical condition  Airway Management Planned: Oral ETT  Additional Equipment:   Intra-op Plan:   Post-operative Plan: Extubation in OR  Informed Consent: I have reviewed the patients History and Physical, chart, labs and discussed the procedure including the risks, benefits and alternatives for the proposed anesthesia with the patient or authorized representative who has indicated his/her understanding and acceptance.   Dental advisory given  Plan Discussed with: CRNA and Surgeon  Anesthesia Plan Comments:         Anesthesia Quick  Evaluation

## 2017-12-14 NOTE — H&P (Signed)
Urology Admission H&P  Chief Complaint: urinary retention  History of Present Illness: Richard [facility] is a 84yo with a hx of BPH who has failed medical therapy. He has a 160g prostate. He has a hx fo TURP. He has severe LUTS on medical therapy. He denies any fevers/chills/sweats  Past Medical History:  Diagnosis Date  . Asthma    childhood   . Basal cell cancer   . BPH (benign prostatic hyperplasia)   . GERD (gastroesophageal reflux disease)    occ  . Gout    changed diet years ago ; no gout attacks since   . Hypertension   . Paroxysmal A-fib (HCC)    occurred post-operatively ; last EKG he was in NSR   . Renal insufficiency    non problematic    Past Surgical History:  Procedure Laterality Date  . EYE SURGERY  about 5-6 years ago    cataracts bilateral   . NOSE SURGERY    . Skin cancer removed    . TONSILLECTOMY    . TRANSURETHRAL RESECTION OF PROSTATE      Home Medications:  Current Facility-Administered Medications  Medication Dose Route Frequency Provider Last Rate Last Dose  . ceFAZolin (ANCEF) 2-4 GM/100ML-% IVPB           . ceFAZolin (ANCEF) IVPB 2g/100 mL premix  2 g Intravenous 30 min Pre-Op Cleon Gustin, MD      . lactated ringers infusion   Intravenous Continuous Myrtie Soman, MD 100 mL/hr at 12/14/17 0856 1,000 mL at 12/14/17 0856  . magnesium citrate solution 1 Bottle  1 Bottle Oral Once Dearius Hoffmann, Candee Furbish, MD       Allergies:  Allergies  Allergen Reactions  . Latex     Mild rash    Family History  Problem Relation Age of Onset  . Coronary artery disease Unknown        No family history   Social History:  reports that he quit smoking about 23 years ago. He has never used smokeless tobacco. He reports that he drinks alcohol. He reports that he does not use drugs.  Review of Systems  Genitourinary: Positive for frequency and urgency.  All other systems reviewed and are negative.   Physical Exam:  Vital signs in last 24 hours: Temp:  [98.3 F  (36.8 C)] 98.3 F (36.8 C) (05/30 0817) Pulse Rate:  [56] 56 (05/30 0817) Resp:  [18] 18 (05/30 0817) BP: (132)/(57) 132/57 (05/30 0817) SpO2:  [99 %] 99 % (05/30 0817) Weight:  [88.9 kg (196 lb)] 88.9 kg (196 lb) (05/30 0851) Physical Exam  Constitutional: He is oriented to person, place, and time. He appears well-developed and well-nourished.  HENT:  Head: Normocephalic and atraumatic.  Eyes: Pupils are equal, round, and reactive to light. EOM are normal.  Neck: Normal range of motion. No thyromegaly present.  Cardiovascular: Normal rate and regular rhythm.  Respiratory: Effort normal. No respiratory distress.  GI: Soft. He exhibits no distension.  Musculoskeletal: Normal range of motion. He exhibits no edema.  Neurological: He is alert and oriented to person, place, and time.  Skin: Skin is warm and dry.  Psychiatric: He has a normal mood and affect. His behavior is normal. Judgment and thought content normal.    Laboratory Data:  No results found for this or any previous visit (from the past 24 hour(s)). No results found for this or any previous visit (from the past 240 hour(s)). Creatinine: Recent Labs    12/07/17  47  CREATININE 1.70*   Baseline Creatinine: 1.7  Impression/Assessment:  85yo with BPH and urinary retention  Plan:  The risks/benefits/alternatives to robot assisted laparoscopic simple prostatectomy was explained the the patient and he understands and wishes to proceed with surgery  Nicolette Bang 12/14/2017, 10:12 AM

## 2017-12-15 ENCOUNTER — Encounter (HOSPITAL_COMMUNITY): Payer: Self-pay | Admitting: Urology

## 2017-12-15 LAB — HEMOGLOBIN AND HEMATOCRIT, BLOOD
HEMATOCRIT: 37.7 % — AB (ref 39.0–52.0)
HEMOGLOBIN: 12.5 g/dL — AB (ref 13.0–17.0)

## 2017-12-15 LAB — BASIC METABOLIC PANEL
ANION GAP: 4 — AB (ref 5–15)
BUN: 28 mg/dL — ABNORMAL HIGH (ref 6–20)
CO2: 24 mmol/L (ref 22–32)
Calcium: 8.1 mg/dL — ABNORMAL LOW (ref 8.9–10.3)
Chloride: 108 mmol/L (ref 101–111)
Creatinine, Ser: 1.6 mg/dL — ABNORMAL HIGH (ref 0.61–1.24)
GFR, EST AFRICAN AMERICAN: 44 mL/min — AB (ref >60)
GFR, EST NON AFRICAN AMERICAN: 38 mL/min — AB (ref >60)
Glucose, Bld: 218 mg/dL — ABNORMAL HIGH (ref 65–99)
POTASSIUM: 4.9 mmol/L (ref 3.5–5.1)
SODIUM: 136 mmol/L (ref 135–145)

## 2017-12-15 MED ORDER — SODIUM CHLORIDE 0.9 % IR SOLN
3000.0000 mL | Status: DC
  Administered 2017-12-15 – 2017-12-16 (×2): 3000 mL via INTRAVESICAL

## 2017-12-15 NOTE — Anesthesia Postprocedure Evaluation (Signed)
Anesthesia Post Note  Patient: Richard [facility].  Procedure(s) Performed: XI ROBOTIC ASSISTED SIMPLE PROSTATECTOMY (N/A Prostate)     Patient location during evaluation: PACU Anesthesia Type: General Level of consciousness: awake and alert Pain management: pain level controlled Vital Signs Assessment: post-procedure vital signs reviewed and stable Respiratory status: spontaneous breathing, nonlabored ventilation, respiratory function stable and patient connected to nasal cannula oxygen Cardiovascular status: blood pressure returned to baseline and stable Postop Assessment: no apparent nausea or vomiting Anesthetic complications: no    Last Vitals:  Vitals:   12/14/17 2107 12/15/17 0532  BP: (!) 143/74 131/62  Pulse: 73 (!) 56  Resp: 18 20  Temp: 36.6 C 36.6 C  SpO2: 98% 97%    Last Pain:  Vitals:   12/15/17 0532  TempSrc: Oral  PainSc:                  Richard Vincent S

## 2017-12-15 NOTE — Progress Notes (Signed)
1 Day Post-Op Subjective: Patient reports minimal abdominal pain. Urine very light pink on slow drip CBI. Negative flatus  Objective: Vital signs in last 24 hours: Temp:  [97.9 F (36.6 C)-98.6 F (37 C)] 98.5 F (36.9 C) (05/31 1255) Pulse Rate:  [51-73] 57 (05/31 1255) Resp:  [18-24] 24 (05/31 1255) BP: (131-143)/(62-74) 142/67 (05/31 1255) SpO2:  [97 %-100 %] 100 % (05/31 1255)  Intake/Output from previous day: 05/30 0701 - 05/31 0700 In: 6310 [I.V.:1710; IV Piggyback:1000] Out: 15400 [Urine:11825; Drains:10; Blood:250] Intake/Output this shift: Total I/O In: -  Out: 800 [Urine:800]  Physical Exam:  General:alert, cooperative and appears stated age GI: soft, non tender, normal bowel sounds, no palpable masses, no organomegaly, no inguinal hernia Male genitalia: not done Extremities: extremities normal, atraumatic, no cyanosis or edema  Lab Results: Recent Labs    12/14/17 1439 12/15/17 0423  HGB 12.8* 12.5*  HCT 39.6 37.7*   BMET Recent Labs    12/15/17 0423  NA 136  K 4.9  CL 108  CO2 24  GLUCOSE 218*  BUN 28*  CREATININE 1.60*  CALCIUM 8.1*   No results for input(s): LABPT, INR in the last 72 hours. No results for input(s): LABURIN in the last 72 hours. Results for orders placed or performed during the hospital encounter of 05/22/17  Urine culture     Status: None   Collection Time: 05/22/17  3:17 PM  Result Value Ref Range Status   Specimen Description URINE, CATHETERIZED  Final   Special Requests Normal  Final   Culture   Final    NO GROWTH Performed at [facility], [address], [place], [place] [address]    Report Status 05/23/2017 FINAL  Final    Studies/Results: No results found.  Assessment/Plan: POD#1 robotic simple prostatectomy  1. Start clear liquid diet, advance diet as tolerated 2. Wean CBI to off 3. Likely discharge saturday    LOS: 1 day   Nicolette Bang 12/15/2017, 8:34 PM

## 2017-12-16 MED ORDER — MENTHOL 3 MG MT LOZG
1.0000 | LOZENGE | OROMUCOSAL | Status: DC | PRN
  Filled 2017-12-16 (×2): qty 9

## 2017-12-16 NOTE — Progress Notes (Signed)
Patient has been discharged, instructions reviewed. Pt denied questions and  or concerns r/t dc/instruction/foley/leg bag teaching. No changes to am assessment. A&O x4, ambulatory without assist. Hard prescriptions provided

## 2017-12-21 NOTE — Discharge Summary (Signed)
Physician Discharge Summary  Patient ID: Richard Vincent. MRN: [id number] DOB/AGE: 1932-03-16 84 y.o.  Admit date: 12/14/2017 Discharge date: 12/16/2017  Admission Diagnoses: BPH with urinary retention Discharge Diagnoses:  Active Problems:   BPH with obstruction/lower urinary tract symptoms   Discharged Condition: good  Hospital Course: The patient tolerated the procedure well and was transferred to the floor on IV pain meds, IV fluid. On POD#1, pt was started on clear liquid diet and they ambulated in the halls. On POD#2 the patient was transitioned to a regular diet, IVFs were discontinued, and the patient passed flatus. Prior to discharge the pt was tolerating a regular diet, pain was controlled on PO pain meds, they were ambulating without difficulty, and they had normal bowel function.  Consults: None  Significant Diagnostic Studies: none  Treatments: surgery: simple prostatectomy  Discharge Exam: Blood pressure 138/66, pulse (!) 59, temperature 99.1 F (37.3 C), temperature source Oral, resp. rate 16, height 6\' 1"  (1.854 m), weight 88.9 kg (196 lb), SpO2 97 %. General appearance: alert, cooperative and appears stated age Head: Normocephalic, without obvious abnormality, atraumatic Eyes: conjunctivae/corneas clear. PERRL, EOM's intact. Fundi benign. Nose: Nares normal. Septum midline. Mucosa normal. No drainage or sinus tenderness. Neck: no adenopathy, no carotid bruit, no JVD, supple, symmetrical, trachea midline and thyroid not enlarged, symmetric, no tenderness/mass/nodules Resp: clear to auscultation bilaterally Cardio: regular rate and rhythm, S1, S2 normal, no murmur, click, rub or gallop GI: soft, non-tender; bowel sounds normal; no masses,  no organomegaly Extremities: extremities normal, atraumatic, no cyanosis or edema Neurologic: Grossly normal  Disposition:    Allergies as of 12/16/2017      Reactions   Latex    Mild rash      Medication List    STOP  taking these medications   aspirin 325 MG EC tablet   FIBER PO   Rivaroxaban 15 MG Tabs tablet Commonly known as:  XARELTO   tamsulosin 0.4 MG Caps capsule Commonly known as:  FLOMAX   VITAMIN D PO     TAKE these medications   amLODipine 10 MG tablet Commonly known as:  NORVASC Take 1 tablet (10 mg total) by mouth daily.   finasteride 5 MG tablet Commonly known as:  PROSCAR Take 5 mg by mouth every evening.   HYDROcodone-acetaminophen 5-325 MG tablet Commonly known as:  NORCO Take 1-2 tablets by mouth every 6 (six) hours as needed for moderate pain or severe pain.   METAMUCIL PO Take 1 tablespoonful with water every evening   metoprolol succinate 50 MG 24 hr tablet Commonly known as:  TOPROL-XL TAKE ONE TABLET BY MOUTH DAILY   sodium chloride 0.65 % Soln nasal spray Commonly known as:  OCEAN Place 1 spray into both nostrils as needed for congestion.   sulfamethoxazole-trimethoprim 800-160 MG tablet Commonly known as:  BACTRIM DS,SEPTRA DS Take 1 tablet by mouth 2 (two) times daily. Start the day prior to foley removal appointment   white petrolatum Gel Commonly known as:  VASELINE Apply 1 application topically as needed for dry skin.      Follow-up Information    Millie Shorb, Candee Furbish, MD Follow up on 12/28/2017.   Specialty:  Urology Why:  at 10:15 Contact information: [address] [place] [address] [phone]           Signed: Nicolette Bang 12/21/2017, 8:17 AM

## 2017-12-28 ENCOUNTER — Other Ambulatory Visit: Payer: Self-pay | Admitting: Cardiology

## 2017-12-28 NOTE — Telephone Encounter (Signed)
REFILL 

## 2018-01-02 NOTE — Progress Notes (Signed)
HPI: FU palpitationsand atrial fibrillation. CardioNet in July of 2013 showed sinus bradycardia with occasional PAC. TSH 2.034. 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 Nuclear study December 2016 showed ejection fraction 53%, prior infarct but no ischemia.Patient found to be in atrial fibrillation following surgical procedure in July 2018 at [facility]. Echocardiogram July 2018 showed normal LV function, grade 2 diastolic dysfunction, mild mitral regurgitation and mild left atrial enlargement. Plan was to proceed with cardioversion if atrial fibrillation persists as this may have been postoperative atrial fibrillation.Pt in sinus at last ov. Since I last saw him, the patient denies any dyspnea on exertion, orthopnea, PND, pedal edema, palpitations, syncope or chest pain.   Current Outpatient Medications  Medication Sig Dispense Refill  . amLODipine (NORVASC) 10 MG tablet Take 1 tablet (10 mg total) by mouth daily.    . finasteride (PROSCAR) 5 MG tablet Take 5 mg by mouth every evening.     . metoprolol succinate (TOPROL-XL) 50 MG 24 hr tablet TAKE ONE TABLET BY MOUTH DAILY 90 tablet 8  . Psyllium (METAMUCIL PO) Take 1 tablespoonful with water every evening    . sodium chloride (OCEAN) 0.65 % SOLN nasal spray Place 1 spray into both nostrils as needed for congestion.    . white petrolatum (VASELINE) GEL Apply 1 application topically as needed for dry skin.     No current facility-administered medications for this visit.      Past Medical History:  Diagnosis Date  . Asthma    childhood   . Basal cell cancer   . BPH (benign prostatic hyperplasia)   . GERD (gastroesophageal reflux disease)    occ  . Gout    changed diet years ago ; no gout attacks since   . Hypertension   . Paroxysmal A-fib (HCC)    occurred post-operatively ; last EKG he was in NSR   . Renal insufficiency    non  problematic     Past Surgical History:  Procedure Laterality Date  . EYE SURGERY  about 5-6 years ago    cataracts bilateral   . NOSE SURGERY    . Skin cancer removed    . TONSILLECTOMY    . TRANSURETHRAL RESECTION OF PROSTATE    . XI ROBOTIC ASSISTED SIMPLE PROSTATECTOMY N/A 12/14/2017   Procedure: XI ROBOTIC ASSISTED SIMPLE PROSTATECTOMY;  Surgeon: Cleon Gustin, MD;  Location: [facility];  Service: Urology;  Laterality: N/A;    Social History   Socioeconomic History  . Marital status: Married    Spouse name: Not on file  . Number of children: 6  . Years of education: Not on file  . Highest education level: Not on file  Occupational History    Comment: Engineer  Social Needs  . Financial resource strain: Not on file  . Food insecurity:    Worry: Not on file    Inability: Not on file  . Transportation needs:    Medical: Not on file    Non-medical: Not on file  Tobacco Use  . Smoking status: Former Smoker    Last attempt to quit: 10/17/1994    Years since quitting: 23.2  . Smokeless tobacco: Never Used  Substance and Sexual Activity  . Alcohol use: Yes    Comment: Occasional  . Drug use: No  . Sexual activity: Not on file  Lifestyle  . Physical activity:    Days per week:  Not on file    Minutes per session: Not on file  . Stress: Not on file  Relationships  . Social connections:    Talks on phone: Not on file    Gets together: Not on file    Attends religious service: Not on file    Active member of club or organization: Not on file    Attends meetings of clubs or organizations: Not on file    Relationship status: Not on file  . Intimate partner violence:    Fear of current or ex partner: Not on file    Emotionally abused: Not on file    Physically abused: Not on file    Forced sexual activity: Not on file  Other Topics Concern  . Not on file  Social History Narrative  . Not on file    Family History  Problem Relation Age of Onset  . Coronary artery  disease Unknown        No family history    ROS: Some problems with incontinence because of recent prostate surgery but no fevers or chills, productive cough, hemoptysis, dysphasia, odynophagia, melena, hematochezia, dysuria, hematuria, rash, seizure activity, orthopnea, PND, pedal edema, claudication. Remaining systems are negative.  Physical Exam: Well-developed well-nourished in no acute distress.  Skin is warm and dry.  HEENT is normal.  Neck is supple.  Chest is clear to auscultation with normal expansion.  Cardiovascular exam is regular rate and rhythm.  Abdominal exam nontender or distended. No masses palpated. Extremities show no edema. neuro grossly intact   A/P  1 paroxysmal atrial fibrillation-patient is in sinus rhythm on examination today.  We will continue with beta-blockade for rate control if atrial fibrillation recurs.  Some of his previous atrial fibrillation may have been postoperative but he was asymptomatic and I have therefore elected to continue anticoagulation.  Resume apixaban 2.5 mg twice daily.  2 hypertension-blood pressure is controlled.  Continue present medications.  3 palpitations-symptoms are well controlled.  Continue beta-blocker.  Kirk Ruths, MD

## 2018-01-03 ENCOUNTER — Encounter: Payer: Self-pay | Admitting: Cardiology

## 2018-01-03 ENCOUNTER — Ambulatory Visit (INDEPENDENT_AMBULATORY_CARE_PROVIDER_SITE_OTHER): Payer: Medicare Other | Admitting: Cardiology

## 2018-01-03 VITALS — BP 118/58 | HR 55 | Ht 73.0 in | Wt 182.1 lb

## 2018-01-03 DIAGNOSIS — R002 Palpitations: Secondary | ICD-10-CM | POA: Diagnosis not present

## 2018-01-03 DIAGNOSIS — I10 Essential (primary) hypertension: Secondary | ICD-10-CM | POA: Diagnosis not present

## 2018-01-03 DIAGNOSIS — I480 Paroxysmal atrial fibrillation: Secondary | ICD-10-CM

## 2018-01-03 MED ORDER — APIXABAN 2.5 MG PO TABS: 2.5000 mg | ORAL_TABLET | Freq: Two times a day (BID) | ORAL | 11 refills | Status: DC

## 2018-01-03 NOTE — Patient Instructions (Signed)
Medication Instructions:   START ELIQUIS 2.5 MG TWICE DAILY  Follow-Up:  Your physician wants you to follow-up in: [place] will receive a reminder letter in the mail two months in advance. If you don't receive a letter, please call our office to schedule the follow-up appointment.   If you need a refill on your cardiac medications before your next appointment, please call your pharmacy.

## 2018-03-15 ENCOUNTER — Telehealth: Payer: Self-pay | Admitting: Cardiology

## 2018-03-15 NOTE — Telephone Encounter (Signed)
°  [place]- NP, phone [phone]  calling to report PAD lower extremity

## 2018-03-15 NOTE — Telephone Encounter (Signed)
Attempted to return call to [facility] NP with [facility] Mailbox is full. Not certain if any follow up is needed or if this is an [place].   Routed to primary RN Encounter closed

## 2018-06-11 NOTE — Progress Notes (Signed)
HPI: FU palpitationsand atrial fibrillation. CardioNet in July of 2013 showed sinus bradycardia with occasional PAC. Nuclear study December 2016 showed ejection fraction 53%, prior infarct but no ischemia.Patient found to be in atrial fibrillation following surgical procedure in July 2018 at [facility]. Echocardiogram July 2018 showed normal LV function, grade 2 diastolic dysfunction, mild mitral regurgitation and mild left atrial enlargement. Plan was to proceed with cardioversion if atrial fibrillation persisted as this may have been postoperative atrial fibrillation. However he converted spontaneously. Patient had recent Lifeline screening in September 2019 that showed mild carotid artery disease bilaterally, no abdominal aortic aneurysm and normal ABIs.  Since I last saw him,he has had recent dyspnea on exertion which he attributes to a cold.  He denies chest pain, palpitations, syncope or bleeding.  Current Outpatient Medications  Medication Sig Dispense Refill  . amLODipine (NORVASC) 10 MG tablet Take 1 tablet (10 mg total) by mouth daily.    Marland Kitchen apixaban (ELIQUIS) 2.5 MG TABS tablet Take 1 tablet (2.5 mg total) by mouth 2 (two) times daily. 60 tablet 11  . metoprolol succinate (TOPROL-XL) 50 MG 24 hr tablet TAKE ONE TABLET BY MOUTH DAILY 90 tablet 8  . Psyllium (METAMUCIL PO) Take 1 tablespoonful with water every evening    . sodium chloride (OCEAN) 0.65 % SOLN nasal spray Place 1 spray into both nostrils as needed for congestion.    . white petrolatum (VASELINE) GEL Apply 1 application topically as needed for dry skin.     No current facility-administered medications for this visit.      Past Medical History:  Diagnosis Date  . Asthma    childhood   . Basal cell cancer   . BPH (benign prostatic hyperplasia)   . GERD (gastroesophageal reflux disease)    occ  . Gout    changed diet years ago ; no gout attacks since   . Hypertension   . Paroxysmal A-fib (HCC)    occurred post-operatively ; last EKG he was in NSR   . Renal insufficiency    non problematic     Past Surgical History:  Procedure Laterality Date  . EYE SURGERY  about 5-6 years ago    cataracts bilateral   . NOSE SURGERY    . Skin cancer removed    . TONSILLECTOMY    . TRANSURETHRAL RESECTION OF PROSTATE    . XI ROBOTIC ASSISTED SIMPLE PROSTATECTOMY N/A 12/14/2017   Procedure: XI ROBOTIC ASSISTED SIMPLE PROSTATECTOMY;  Surgeon: Cleon Gustin, MD;  Location: [facility];  Service: Urology;  Laterality: N/A;    Social History   Socioeconomic History  . Marital status: Married    Spouse name: Not on file  . Number of children: 6  . Years of education: Not on file  . Highest education level: Not on file  Occupational History    Comment: Engineer  Social Needs  . Financial resource strain: Not on file  . Food insecurity:    Worry: Not on file    Inability: Not on file  . Transportation needs:    Medical: Not on file    Non-medical: Not on file  Tobacco Use  . Smoking status: Former Smoker    Last attempt to quit: 10/17/1994    Years since quitting: 23.6  . Smokeless tobacco: Never Used  Substance and Sexual Activity  . Alcohol use: Yes    Comment: Occasional  . Drug use: No  . Sexual activity: Not on file  Lifestyle  . Physical activity:    Days per week: Not on file    Minutes per session: Not on file  . Stress: Not on file  Relationships  . Social connections:    Talks on phone: Not on file    Gets together: Not on file    Attends religious service: Not on file    Active member of club or organization: Not on file    Attends meetings of clubs or organizations: Not on file    Relationship status: Not on file  . Intimate partner violence:    Fear of current or ex partner: Not on file    Emotionally abused: Not on file    Physically abused: Not on file    Forced sexual activity: Not on file  Other Topics Concern  . Not on file  Social History Narrative  .  Not on file    Family History  Problem Relation Age of Onset  . Coronary artery disease Unknown        No family history    ROS: Recent cold symptoms including congestion but no fevers or chills, productive cough, hemoptysis, dysphasia, odynophagia, melena, hematochezia, dysuria, hematuria, rash, seizure activity, orthopnea, PND, pedal edema, claudication. Remaining systems are negative.  Physical Exam: Well-developed well-nourished in no acute distress.  Skin is warm and dry.  HEENT is normal.  Neck is supple.  Chest is clear to auscultation with normal expansion.  Cardiovascular exam is regular rate and rhythm.  Abdominal exam nontender or distended. No masses palpated. Extremities show no edema. neuro grossly intact  ECG-sinus bradycardia at a rate of 52.  No ST changes.  Personally reviewed  A/P  1 paroxysmal atrial fibrillation-patient remains in sinus rhythm on examination today.  We will continue with beta-blockade for rate control if atrial fibrillation recurs.  We will continue apixaban 2.5 mg twice daily.  2 history of palpitations-symptoms are controlled.  Continue present dose of beta-blocker.  3 hypertension-patient's blood pressure is controlled today.  Continue present medications and follow.  Kirk Ruths, MD

## 2018-06-20 ENCOUNTER — Encounter: Payer: Self-pay | Admitting: Cardiology

## 2018-06-20 ENCOUNTER — Ambulatory Visit (INDEPENDENT_AMBULATORY_CARE_PROVIDER_SITE_OTHER): Payer: Medicare Other | Admitting: Cardiology

## 2018-06-20 VITALS — BP 132/64 | HR 52 | Ht 73.0 in | Wt 202.0 lb

## 2018-06-20 DIAGNOSIS — I10 Essential (primary) hypertension: Secondary | ICD-10-CM

## 2018-06-20 DIAGNOSIS — I480 Paroxysmal atrial fibrillation: Secondary | ICD-10-CM | POA: Diagnosis not present

## 2018-06-20 DIAGNOSIS — R002 Palpitations: Secondary | ICD-10-CM

## 2018-06-20 NOTE — Patient Instructions (Signed)
Your physician recommends that you schedule a follow-up appointment in: [place] 2 MONTHS PRIOR TO THAT APPOINTMENT TIME TO SCHEDULE

## 2018-07-09 ENCOUNTER — Other Ambulatory Visit: Payer: Self-pay | Admitting: *Deleted

## 2018-07-09 MED ORDER — AMLODIPINE BESYLATE 10 MG PO TABS: 10.0000 mg | ORAL_TABLET | Freq: Every day | ORAL | 3 refills | Status: DC

## 2018-07-13 ENCOUNTER — Other Ambulatory Visit: Payer: Self-pay | Admitting: Cardiology

## 2018-07-13 NOTE — Telephone Encounter (Signed)
metoprolol succinate (TOPROL-XL) 50 MG 24 hr tablet  Medication  Date: 09/27/2017 Department: [facility] Ordering/Authorizing: Lelon Perla, MD  Order Providers   Prescribing Provider Encounter Provider  Stanford Breed, Denice Bors, MD Lelon Perla, MD  Outpatient Medication Detail    Disp Refills Start End   metoprolol succinate (TOPROL-XL) 50 MG 24 hr tablet 90 tablet 8 09/27/2017    Sig: TAKE ONE TABLET BY MOUTH DAILY   Sent to pharmacy as: metoprolol succinate (TOPROL-XL) 50 MG 24 hr tablet   E-Prescribing Status: Receipt confirmed by pharmacy (09/27/2017 11:46 AM EDT)   Pharmacy   [facility] [place] [place] [facility] [address]

## 2018-07-21 ENCOUNTER — Other Ambulatory Visit: Payer: Self-pay | Admitting: Cardiology

## 2018-07-23 ENCOUNTER — Telehealth: Payer: Self-pay | Admitting: Cardiology

## 2018-07-23 NOTE — Telephone Encounter (Signed)
Patient called stating [facility] has sent over 2 refill requests for Metoprolol and they have not not received a response/  Patient would like a return call to discuss.  He can be reached at [phone]

## 2018-07-24 NOTE — Telephone Encounter (Signed)
Refill sent to the pharmacy electronically.  

## 2018-08-30 ENCOUNTER — Telehealth: Payer: Self-pay | Admitting: Cardiology

## 2018-08-30 NOTE — Telephone Encounter (Signed)
Spoke with pt, aware okay to take vit D3.

## 2018-08-30 NOTE — Telephone Encounter (Signed)
Patient states he bought D3 capsules for a supplement, but now after reading package, not sure if he should be taking them or not.

## 2018-10-12 ENCOUNTER — Other Ambulatory Visit: Payer: Self-pay | Admitting: Cardiology

## 2018-10-12 NOTE — Telephone Encounter (Signed)
° ° ° °  1. Which medications need to be refilled? (please list name of each medication and dose if known) Metoprolol succ 50mg  24hr tablet  2. Which pharmacy/location (including street and city if local pharmacy) is medication to be sent to?[facility] [address] [place]  3. Do they need a 30 day or 90 day supply? [place]

## 2018-10-15 MED ORDER — METOPROLOL SUCCINATE ER 50 MG PO TB24: 50.0000 mg | ORAL_TABLET | Freq: Every day | ORAL | 2 refills | Status: DC

## 2018-10-16 ENCOUNTER — Other Ambulatory Visit: Payer: Self-pay | Admitting: Cardiology

## 2018-10-18 ENCOUNTER — Other Ambulatory Visit: Payer: Self-pay | Admitting: Cardiology

## 2018-10-18 ENCOUNTER — Encounter: Payer: Self-pay | Admitting: *Deleted

## 2018-10-18 NOTE — Telephone Encounter (Signed)
Left message for pt to call, patient is needing a refill per message from [place] office staff.

## 2018-11-19 NOTE — Telephone Encounter (Signed)
This encounter was created in error - please disregard.

## 2019-01-24 ENCOUNTER — Other Ambulatory Visit: Payer: Self-pay

## 2019-01-24 ENCOUNTER — Other Ambulatory Visit: Payer: Self-pay | Admitting: Cardiology

## 2019-01-24 DIAGNOSIS — R002 Palpitations: Secondary | ICD-10-CM

## 2019-01-24 DIAGNOSIS — I480 Paroxysmal atrial fibrillation: Secondary | ICD-10-CM

## 2019-01-24 NOTE — Telephone Encounter (Signed)
Pt is an 86yom requesting eliquis 2.5mg , wt 91.6kg, scr outdated, lov w/crenshaw (06/20/18) Dx afib Called pt notified that the labs were outdated and the pt agrees to come have them done on 01/29/19

## 2019-02-11 ENCOUNTER — Other Ambulatory Visit: Payer: Self-pay | Admitting: Cardiology

## 2019-02-11 ENCOUNTER — Other Ambulatory Visit: Payer: Self-pay

## 2019-02-11 DIAGNOSIS — R002 Palpitations: Secondary | ICD-10-CM

## 2019-02-12 LAB — BASIC METABOLIC PANEL
BUN/Creatinine Ratio: 16 (ref 10–24)
BUN: 28 mg/dL — ABNORMAL HIGH (ref 8–27)
CO2: 21 mmol/L (ref 20–29)
Calcium: 8.8 mg/dL (ref 8.6–10.2)
Chloride: 107 mmol/L — ABNORMAL HIGH (ref 96–106)
Creatinine, Ser: 1.77 mg/dL — ABNORMAL HIGH (ref 0.76–1.27)
GFR calc Af Amer: 40 mL/min/{1.73_m2} — ABNORMAL LOW (ref >59)
GFR calc non Af Amer: 35 mL/min/{1.73_m2} — ABNORMAL LOW (ref >59)
Glucose: 77 mg/dL (ref 65–99)
Potassium: 5 mmol/L (ref 3.5–5.2)
Sodium: 143 mmol/L (ref 134–144)

## 2019-04-04 ENCOUNTER — Telehealth: Payer: Self-pay | Admitting: *Deleted

## 2019-04-04 DIAGNOSIS — I480 Paroxysmal atrial fibrillation: Secondary | ICD-10-CM

## 2019-04-04 MED ORDER — APIXABAN 2.5 MG PO TABS: 2.5000 mg | ORAL_TABLET | Freq: Two times a day (BID) | ORAL | 3 refills | Status: DC

## 2019-04-04 NOTE — Telephone Encounter (Signed)
*  STAT* If patient is at the pharmacy, call can be transferred to refill team.   1. Which medications need to be refilled? (please list name of each medication and dose if known) Eliquis 2.5 mg bid  2. Which pharmacy/location (including street and city if local pharmacy) is medication to be sent to?[facility]  3. Do they need a 30 day or 90 day supply? [place]

## 2019-06-10 NOTE — Progress Notes (Signed)
HPI: FU palpitationsand atrial fibrillation. CardioNet in July of 2013 showed sinus bradycardia with occasional PAC. Nuclear study December 2016 showed ejection fraction 53%, prior infarct but no ischemia.Patient found to be in atrial fibrillation following surgical procedure in [facility]. Echo July 2018 showed normal LV function, grade 2 diastolic dysfunction, mild mitral regurgitation and mild left atrial enlargement. Plan was to proceed with cardioversion if atrial fibrillation persisted as this may have been postoperative atrial fibrillation. However he converted spontaneously. Patient had [facility] screening in September 2019 that showed mild carotid artery disease bilaterally, no abdominal aortic aneurysm and normal ABIs.  Since I last saw him,the patient has dyspnea with more extreme activities but not with routine activities. It is relieved with rest. It is not associated with chest pain. There is no orthopnea, PND or pedal edema. There is no syncope or palpitations. There is no exertional chest pain.   Current Outpatient Medications  Medication Sig Dispense Refill  . amLODipine (NORVASC) 10 MG tablet Take 1 tablet (10 mg total) by mouth daily. 90 tablet 3  . apixaban (ELIQUIS) 2.5 MG TABS tablet Take 1 tablet (2.5 mg total) by mouth 2 (two) times daily. 60 tablet 3  . Ascorbic Acid (VITAMIN C PO) Take by mouth.    . Cholecalciferol (VITAMIN D3 PO) Take by mouth.    . metoprolol succinate (TOPROL-XL) 50 MG 24 hr tablet TAKE ONE TABLET BY MOUTH DAILY; NEEDS AN OFFICE VISIT FOR MORE REFILLS 90 tablet 0  . Psyllium (METAMUCIL PO) Take 1 tablespoonful with water every evening     No current facility-administered medications for this visit.      Past Medical History:  Diagnosis Date  . Asthma    childhood   . Basal cell cancer   . BPH (benign prostatic hyperplasia)   . GERD (gastroesophageal reflux disease)    occ  . Gout    changed diet years ago ; no  gout attacks since   . Hypertension   . Paroxysmal A-fib (HCC)    occurred post-operatively ; last EKG he was in NSR   . Renal insufficiency    non problematic     Past Surgical History:  Procedure Laterality Date  . EYE SURGERY  about 5-6 years ago    cataracts bilateral   . NOSE SURGERY    . Skin cancer removed    . TONSILLECTOMY    . TRANSURETHRAL RESECTION OF PROSTATE    . XI ROBOTIC ASSISTED SIMPLE PROSTATECTOMY N/A 12/14/2017   Procedure: XI ROBOTIC ASSISTED SIMPLE PROSTATECTOMY;  Surgeon: Cleon Gustin, MD;  Location: [facility];  Service: Urology;  Laterality: N/A;    Social History   Socioeconomic History  . Marital status: Married    Spouse name: Not on file  . Number of children: 6  . Years of education: Not on file  . Highest education level: Not on file  Occupational History    Comment: Engineer  Social Needs  . Financial resource strain: Not on file  . Food insecurity    Worry: Not on file    Inability: Not on file  . Transportation needs    Medical: Not on file    Non-medical: Not on file  Tobacco Use  . Smoking status: Former Smoker    Quit date: 10/17/1994    Years since quitting: 24.6  . Smokeless tobacco: Never Used  Substance and Sexual Activity  . Alcohol use: Yes    Comment: Occasional  .  Drug use: No  . Sexual activity: Not on file  Lifestyle  . Physical activity    Days per week: Not on file    Minutes per session: Not on file  . Stress: Not on file  Relationships  . Social Herbalist on phone: Not on file    Gets together: Not on file    Attends religious service: Not on file    Active member of club or organization: Not on file    Attends meetings of clubs or organizations: Not on file    Relationship status: Not on file  . Intimate partner violence    Fear of current or ex partner: Not on file    Emotionally abused: Not on file    Physically abused: Not on file    Forced sexual activity: Not on file  Other Topics  Concern  . Not on file  Social History Narrative  . Not on file    Family History  Problem Relation Age of Onset  . Coronary artery disease Unknown        No family history    ROS: no fevers or chills, productive cough, hemoptysis, dysphasia, odynophagia, melena, hematochezia, dysuria, hematuria, rash, seizure activity, orthopnea, PND, pedal edema, claudication. Remaining systems are negative.  Physical Exam: Well-developed well-nourished in no acute distress.  Skin is warm and dry.  HEENT is normal.  Neck is supple.  Chest is clear to auscultation with normal expansion.  Cardiovascular exam is irregular Abdominal exam nontender or distended. No masses palpated. Extremities show no edema. neuro grossly intact  ECG-atrial fibrillation at a rate of 60, no ST changes.  Personally reviewed  A/P  1 paroxysmal atrial fibrillation-patient in atrial fibrillation today.  However he is not having symptoms of dyspnea, fatigue, palpitations.  I therefore think that rate control and anticoagulation would be appropriate.  Continue present dose of toprol and apixaban.    2 Hypertension-blood pressure controlled.  Continue present medications.  3 palpitations-continue present dose of beta-blocker.  Symptoms are well controlled.  Kirk Ruths, MD

## 2019-06-19 ENCOUNTER — Encounter: Payer: Self-pay | Admitting: Cardiology

## 2019-06-19 ENCOUNTER — Other Ambulatory Visit: Payer: Self-pay

## 2019-06-19 ENCOUNTER — Ambulatory Visit (INDEPENDENT_AMBULATORY_CARE_PROVIDER_SITE_OTHER): Payer: Medicare Other | Admitting: Cardiology

## 2019-06-19 VITALS — BP 124/66 | HR 60 | Ht 73.0 in | Wt 205.0 lb

## 2019-06-19 DIAGNOSIS — I480 Paroxysmal atrial fibrillation: Secondary | ICD-10-CM

## 2019-06-19 DIAGNOSIS — R002 Palpitations: Secondary | ICD-10-CM

## 2019-06-19 DIAGNOSIS — I10 Essential (primary) hypertension: Secondary | ICD-10-CM

## 2019-06-19 NOTE — Patient Instructions (Signed)

## 2019-07-09 ENCOUNTER — Other Ambulatory Visit: Payer: Self-pay | Admitting: Cardiology

## 2019-07-19 DIAGNOSIS — C3490 Malignant neoplasm of unspecified part of unspecified bronchus or lung: Secondary | ICD-10-CM

## 2019-07-19 HISTORY — DX: Malignant neoplasm of unspecified part of unspecified bronchus or lung: C34.90

## 2019-08-10 ENCOUNTER — Other Ambulatory Visit: Payer: Self-pay | Admitting: Cardiology

## 2019-08-10 DIAGNOSIS — I480 Paroxysmal atrial fibrillation: Secondary | ICD-10-CM

## 2019-08-17 ENCOUNTER — Other Ambulatory Visit: Payer: Self-pay | Admitting: Cardiology

## 2019-08-17 DIAGNOSIS — I480 Paroxysmal atrial fibrillation: Secondary | ICD-10-CM

## 2019-08-26 ENCOUNTER — Telehealth: Payer: Self-pay | Admitting: Cardiology

## 2019-08-26 NOTE — Telephone Encounter (Signed)
Patient calling stating he's been having trouble sleeping and would like to know if he can take melatonin 5mg  tablets at night.

## 2019-08-26 NOTE — Telephone Encounter (Signed)
Okay to take melatonin 5mg  for sleep aid. No interaction with medication or disease state noted.

## 2019-08-26 NOTE — Telephone Encounter (Signed)
Routed to CVRR to advise is OK to take with current meds

## 2019-08-26 NOTE — Telephone Encounter (Signed)
Spoke with pt, aware okay to use melatonin.

## 2019-09-14 ENCOUNTER — Emergency Department (HOSPITAL_BASED_OUTPATIENT_CLINIC_OR_DEPARTMENT_OTHER): Payer: Medicare PPO

## 2019-09-14 ENCOUNTER — Emergency Department (HOSPITAL_BASED_OUTPATIENT_CLINIC_OR_DEPARTMENT_OTHER)
Admission: EM | Admit: 2019-09-14 | Discharge: 2019-09-14 | Disposition: A | Discharge status: Home / Self Care | Payer: Medicare PPO | Attending: Emergency Medicine | Admitting: Emergency Medicine

## 2019-09-14 ENCOUNTER — Other Ambulatory Visit: Payer: Self-pay

## 2019-09-14 ENCOUNTER — Encounter (HOSPITAL_BASED_OUTPATIENT_CLINIC_OR_DEPARTMENT_OTHER): Payer: Self-pay | Admitting: Emergency Medicine

## 2019-09-14 DIAGNOSIS — I10 Essential (primary) hypertension: Secondary | ICD-10-CM | POA: Diagnosis not present

## 2019-09-14 DIAGNOSIS — Z20822 Contact with and (suspected) exposure to covid-19: Secondary | ICD-10-CM | POA: Insufficient documentation

## 2019-09-14 DIAGNOSIS — N390 Urinary tract infection, site not specified: Secondary | ICD-10-CM | POA: Diagnosis not present

## 2019-09-14 DIAGNOSIS — R509 Fever, unspecified: Secondary | ICD-10-CM | POA: Diagnosis present

## 2019-09-14 DIAGNOSIS — N401 Enlarged prostate with lower urinary tract symptoms: Secondary | ICD-10-CM | POA: Insufficient documentation

## 2019-09-14 DIAGNOSIS — Z87891 Personal history of nicotine dependence: Secondary | ICD-10-CM | POA: Insufficient documentation

## 2019-09-14 DIAGNOSIS — Z91040 Latex allergy status: Secondary | ICD-10-CM | POA: Diagnosis not present

## 2019-09-14 DIAGNOSIS — Z79899 Other long term (current) drug therapy: Secondary | ICD-10-CM | POA: Diagnosis not present

## 2019-09-14 DIAGNOSIS — Z7901 Long term (current) use of anticoagulants: Secondary | ICD-10-CM | POA: Diagnosis not present

## 2019-09-14 DIAGNOSIS — R918 Other nonspecific abnormal finding of lung field: Secondary | ICD-10-CM | POA: Diagnosis not present

## 2019-09-14 DIAGNOSIS — J45909 Unspecified asthma, uncomplicated: Secondary | ICD-10-CM | POA: Diagnosis not present

## 2019-09-14 LAB — URINALYSIS, ROUTINE W REFLEX MICROSCOPIC
Bilirubin Urine: NEGATIVE
Glucose, UA: NEGATIVE mg/dL
Ketones, ur: NEGATIVE mg/dL
Nitrite: POSITIVE — AB
Protein, ur: NEGATIVE mg/dL
Specific Gravity, Urine: 1.02 (ref 1.005–1.030)
pH: 7.5 (ref 5.0–8.0)

## 2019-09-14 LAB — URINALYSIS, MICROSCOPIC (REFLEX): WBC, UA: >50 WBC/hpf (ref 0–5)

## 2019-09-14 LAB — SARS CORONAVIRUS 2 AG (30 MIN TAT): SARS Coronavirus 2 Ag: NEGATIVE

## 2019-09-14 MED ORDER — CIPROFLOXACIN HCL 500 MG PO TABS: 500.0000 mg | ORAL_TABLET | Freq: Two times a day (BID) | ORAL | 0 refills | Status: DC

## 2019-09-14 MED ORDER — ACETAMINOPHEN 325 MG PO TABS
650.0000 mg | ORAL_TABLET | Freq: Once | ORAL | Status: AC
  Administered 2019-09-14: 17:00:00 650 mg via ORAL
  Filled 2019-09-14: qty 2

## 2019-09-14 NOTE — ED Notes (Signed)
States approx 0900hrs today began feeling bad, noted to have a fever, mild headache, no coughing or shortness or breath

## 2019-09-14 NOTE — ED Provider Notes (Signed)
[facility] EMERGENCY DEPARTMENT Provider Note   CSN: [id number] Arrival date & time: 09/14/19  1648     History Chief Complaint  Patient presents with  . Fever    Richard Vincent. is a 86 y.o. male with PMHx HTN, paroxysmal A fib on Eliquis, BPH, GERD who presents to the ED today with complaint of fevers with tmax 101.4 that began today.  Patient reports he woke up this morning and felt chilly.  He states that shortly after that he felt just generally weak and fatigued and checked his temperature.  He states his temperature has gradually been increasing with the highest being 101.4.  Patient states he did not take any medications prior to arrival.  He does report he is scheduled to have his first Covid vaccine tomorrow and wanted to make sure he did not have Covid.  Denying any other symptoms at this time.  No recent COVID-19 positive exposure.  Patient denies headache, sore throat, ear pain, sinus pressure, cough, shortness of breath, abdominal pain, nausea, vomiting, diarrhea, dysuria, urinary frequency, urinary retention, any other associated symptoms.   The history is provided by the patient.       Past Medical History:  Diagnosis Date  . Asthma    childhood   . Basal cell cancer   . BPH (benign prostatic hyperplasia)   . GERD (gastroesophageal reflux disease)    occ  . Gout    changed diet years ago ; no gout attacks since   . Hypertension   . Paroxysmal A-fib (HCC)    occurred post-operatively ; last EKG he was in NSR   . Renal insufficiency    non problematic     Patient Active Problem List   Diagnosis Date Noted  . BPH with obstruction/lower urinary tract symptoms 12/14/2017  . Palpitations 01/04/2012  . Hypertension 01/04/2012  . Dyspnea 01/04/2012    Past Surgical History:  Procedure Laterality Date  . EYE SURGERY  about 5-6 years ago    cataracts bilateral   . NOSE SURGERY    . Skin cancer removed    . TONSILLECTOMY    . TRANSURETHRAL  RESECTION OF PROSTATE    . XI ROBOTIC ASSISTED SIMPLE PROSTATECTOMY N/A 12/14/2017   Procedure: XI ROBOTIC ASSISTED SIMPLE PROSTATECTOMY;  Surgeon: Cleon Gustin, MD;  Location: [facility];  Service: Urology;  Laterality: N/A;       Family History  Problem Relation Age of Onset  . Coronary artery disease Other        No family history    Social History   Tobacco Use  . Smoking status: Former Smoker    Quit date: 10/17/1994    Years since quitting: 24.9  . Smokeless tobacco: Never Used  Substance Use Topics  . Alcohol use: Yes    Comment: Occasional  . Drug use: No    Home Medications Prior to Admission medications   Medication Sig Start Date End Date Taking? Authorizing Provider  amLODipine (NORVASC) 10 MG tablet TAKE ONE TABLET BY MOUTH DAILY 07/09/19   Lelon Perla, MD  Ascorbic Acid (VITAMIN C PO) Take by mouth.    [provider]  Cholecalciferol (VITAMIN D3 PO) Take by mouth.    [provider]  ciprofloxacin (CIPRO) 500 MG tablet Take 1 tablet (500 mg total) by mouth 2 (two) times daily. 09/14/19   Timeka Goette, PA-C  ELIQUIS 2.5 MG TABS tablet TAKE ONE TABLET BY MOUTH TWICE A DAY 08/19/19  Lelon Perla, MD  metoprolol succinate (TOPROL-XL) 50 MG 24 hr tablet TAKE ONE TABLET BY MOUTH DAILY; NEEDS AN OFFICE VISIT FOR MORE REFILLS 10/18/18   Lelon Perla, MD  Psyllium (METAMUCIL PO) Take 1 tablespoonful with water every evening    [provider]    Allergies    Latex  Review of Systems   Review of Systems  Constitutional: Positive for chills, fatigue and fever.  HENT: Negative for congestion and sore throat.   Respiratory: Negative for cough and shortness of breath.   Gastrointestinal: Negative for abdominal pain, diarrhea, nausea and vomiting.  Genitourinary: Negative for dysuria and frequency.  All other systems reviewed and are negative.   Physical Exam Updated Vital Signs BP 131/66 (BP Location: Left Arm)   Pulse  89   Temp (!) 100.5 F (38.1 C) (Oral)   Resp (!) 22   Ht 6\' 1"  (1.854 m)   Wt 92.5 kg   SpO2 99%   BMI 26.91 kg/m   Physical Exam Vitals and nursing note reviewed.  Constitutional:      General: He is not in acute distress.    Appearance: He is not ill-appearing, toxic-appearing or diaphoretic.  HENT:     Head: Normocephalic and atraumatic.     Right Ear: Tympanic membrane normal.     Left Ear: Tympanic membrane normal.     Mouth/Throat:     Mouth: Mucous membranes are moist.     Pharynx: No oropharyngeal exudate or posterior oropharyngeal erythema.  Eyes:     Conjunctiva/sclera: Conjunctivae normal.  Cardiovascular:     Rate and Rhythm: Normal rate and regular rhythm.     Pulses: Normal pulses.  Pulmonary:     Effort: Pulmonary effort is normal.     Breath sounds: Normal breath sounds. No wheezing, rhonchi or rales.  Abdominal:     Palpations: Abdomen is soft.     Tenderness: There is no abdominal tenderness. There is no right CVA tenderness, left CVA tenderness, guarding or rebound.  Musculoskeletal:     Cervical back: Neck supple.  Skin:    General: Skin is warm and dry.  Neurological:     Mental Status: He is alert.     ED Results / Procedures / Treatments   Labs (all labs ordered are listed, but only abnormal results are displayed) Labs Reviewed  URINALYSIS, ROUTINE W REFLEX MICROSCOPIC - Abnormal; Notable for the following components:      Result Value   APPearance CLOUDY (*)    Hgb urine dipstick TRACE (*)    Nitrite POSITIVE (*)    Leukocytes,Ua MODERATE (*)    All other components within normal limits  URINALYSIS, MICROSCOPIC (REFLEX) - Abnormal; Notable for the following components:   Bacteria, UA MANY (*)    Non Squamous Epithelial PRESENT (*)    All other components within normal limits  SARS CORONAVIRUS 2 AG (30 MIN TAT)  URINE CULTURE    EKG None  Radiology DG Chest Port 1 View  Result Date: 09/14/2019 CLINICAL DATA:  86 year old male  with concern for infection. EXAM: PORTABLE CHEST 1 VIEW COMPARISON:  Chest radiograph dated 02/28/2005. FINDINGS: Faint 2 cm nodular appearing density in the left mid lung field noted. Further evaluation with CT on a nonemergent/outpatient basis recommended. There is no focal consolidation. There is no pleural effusion or pneumothorax. Mild cardiomegaly. Atherosclerotic calcification of the aorta. No acute osseous pathology. IMPRESSION: 1. No acute cardiopulmonary process. 2. Faint 2 cm nodular density in  the left mid lung field. Further evaluation with chest CT on a nonemergent/outpatient basis recommended. Electronically Signed   By: Anner Crete M.D.   On: 09/14/2019 17:26    Procedures Procedures (including critical care time)  Medications Ordered in ED Medications  acetaminophen (TYLENOL) tablet 650 mg (650 mg Oral Given 09/14/19 1724)    ED Course  I have reviewed the triage vital signs and the nursing notes.  Pertinent labs & imaging results that were available during my care of the patient were reviewed by me and considered in my medical decision making (see chart for details).  Clinical Course as of Sep 13 1753  Sat Sep 14, 2019  1719 86 yo male w/ hx of BPH and UTI presenting to Ed with 1 day of fever, chills, myalgia.  Reports he woke up feeling mild headache, some chills today.  No cough, congestion, abdominal pain, nausea, vomiting, dysuria, flank pain.  No SOB or CP.  He is well appearing on physical exam.  Lungs CTAB.  Heart RRR.  No abdominal ttp.  No nuccal rigidity.  Plan for covid POC, UA, DG chest, re-evaluate.  I doubt sepsis per his initial presentation.   [MT]  1752 SARS Coronavirus 2 Ag: NEGATIVE [MV]    Clinical Course User Index [MT] Trifan, Carola Rhine, MD [MV] Eustaquio Maize, PA-C   86 year old male who presents the ED today complaining of fever, chills, fatigue that started this morning.  On arrival to the ED patient's temp 100.5.  Tylenol ordered. He is  nontachycardic.  Tachypneic at 22.  However no increased work of breathing.  Able to speak in full sentences without difficulty.  Satting 99% on room air.  We will continue to monitor respirations. Suspect mild elevated respirations as they were taken immediately upon arrival.  Patient is overall well-appearing today.  He has no other physical complaints at this time.  He is scheduled to have his COVID-19 vaccine tomorrow and was concerned that he could have Covid.  I will swab for this today.  We will also check a urine as well as a chest x-ray to ensure there is no other infection.  If any acute findings may consider additional blood work however given patient does not look toxic appearing today will hold off on lab work at this time. Discussed case with attending physician Dr. Langston Masker who agrees with plan.   Xray negative for infection. 2 cm nodular density appreciated with recommendation for outpatient CT scan for further eval. Will have patient follow up with his PCP regarding this.   U/A positive for nitrites and leuks with > 50 WBCs per HPF and 6-10 RBCs. It does appear pt has grown pansensitive pseudomonas in the past; previous culture reports show cipro has been sensitive. Will treat with cipro 500 BID x 7 days.   Rapid COVID negative.   Pt discharged at this time with instructions to take abx as prescribed. Tylenol PRN for fevers and to follow up with his PCP regarding the CT scan - pt reports it is difficult to get into the [place] but he will see his cardiologist Dr. Stanford Breed regularly and will follow up with him instead to get it done in a timely manner. Pt also has a urology appointment scheduled in early March for a routine visit; he will have his urine rechecked at that time. Strict return precautions discussed with pt. He is in agreement with plan and stable for discharge home.   This note was prepared using Dragon  voice recognition software and may include unintentional dictation errors due to  the inherent limitations of voice recognition software.  Andrei Era Skeen. was evaluated in Emergency Department on 09/14/2019 for the symptoms described in the history of present illness. He was evaluated in the context of the global COVID-19 pandemic, which necessitated consideration that the patient might be at risk for infection with the SARS-CoV-2 virus that causes COVID-19. Institutional protocols and algorithms that pertain to the evaluation of patients at risk for COVID-19 are in a state of rapid change based on information released by regulatory bodies including the CDC and federal and state organizations. These policies and algorithms were followed during the patient's care in the ED.   MDM Rules/Calculators/A&P                      Final Clinical Impression(s) / ED Diagnoses Final diagnoses:  Lower urinary tract infectious disease  Abnormal x-ray of lung    Rx / DC Orders ED Discharge Orders         Ordered    ciprofloxacin (CIPRO) 500 MG tablet  2 times daily     09/14/19 1750           Discharge Instructions     Your symptoms appear to be related to a urinary tract infection today. Please pick up the antibiotic and take as prescribed. We will call you in 2-3 days IF the antibiotic is resistant to the bacteria in your urine.   Your rapid COVID test was negative today.   Please follow up with your PCP or Dr. Stanford Breed with cardiology regarding your chest xray results today. It does appear you have a nodule on the left side. It is recommended that you have a CT scan of your chest outpatient for further evaluation.   Continue monitoring your symptoms at home. You can take 650 mg Tylenol every 4-6 hours for fevers. If your temperature gets above 100.4 please take Tylenol.   Return to the ED for any worsening symptoms.        Eustaquio Maize, PA-C 09/14/19 1755    Wyvonnia Dusky, MD 09/14/19 2138

## 2019-09-14 NOTE — Discharge Instructions (Signed)
Your symptoms appear to be related to a urinary tract infection today. Please pick up the antibiotic and take as prescribed. We will call you in 2-3 days IF the antibiotic is resistant to the bacteria in your urine.   Your rapid COVID test was negative today.   Please follow up with your PCP or Dr. Stanford Breed with cardiology regarding your chest xray results today. It does appear you have a nodule on the left side. It is recommended that you have a CT scan of your chest outpatient for further evaluation.   Continue monitoring your symptoms at home. You can take 650 mg Tylenol every 4-6 hours for fevers. If your temperature gets above 100.4 please take Tylenol.   Return to the ED for any worsening symptoms.

## 2019-09-14 NOTE — ED Triage Notes (Signed)
States suddenly felt chilled this morning. Fever of 101. Did not take any medication. Denies other symptoms.

## 2019-09-15 ENCOUNTER — Ambulatory Visit: Payer: Medicare PPO | Attending: Internal Medicine

## 2019-09-15 DIAGNOSIS — Z23 Encounter for immunization: Secondary | ICD-10-CM

## 2019-09-15 NOTE — Progress Notes (Signed)
   [id number]  Name:  Richard Vincent.    MRN: [id number] DOB: [birth year]  09/15/2019  Richard Vincent was observed post Covid-19 immunization for 15 minutes without incidence. He was provided with Vaccine Information Sheet and instruction to access the V-Safe system.   Richard Vincent was instructed to call 911 with any severe reactions post vaccine: Marland Kitchen Difficulty breathing  . Swelling of your face and throat  . A fast heartbeat  . A bad rash all over your body  . Dizziness and weakness    Immunizations Administered    Name Date Dose VIS Date Route   Pfizer COVID-19 Vaccine 09/15/2019  3:30 PM 0.3 mL 06/28/2019 Intramuscular   Manufacturer: [place]   Lot: [id number]   [place]: [id number]

## 2019-09-17 LAB — URINE CULTURE: Culture: >=100000 — AB

## 2019-09-18 ENCOUNTER — Telehealth: Payer: Self-pay

## 2019-09-18 NOTE — Telephone Encounter (Signed)
Post ED Visit - Positive Culture Follow-up  Culture report reviewed by antimicrobial stewardship pharmacist: [place] Team []  Elenor Quinones, Pharm.D. [x]  Heide Guile, Pharm.D., BCPS AQ-ID []  Parks Neptune, Pharm.D., BCPS []  Alycia Rossetti, Pharm.D., BCPS []  [place], Pharm.D., BCPS, AAHIVP []  Legrand Como, Pharm.D., BCPS, AAHIVP []  Salome Arnt, PharmD, BCPS []  Johnnette Gourd, PharmD, BCPS []  Hughes Better, PharmD, BCPS []  Leeroy Cha, PharmD []  Laqueta Linden, PharmD, BCPS []  Albertina Parr, PharmD  [place] Team []  Leodis Sias, PharmD []  Lindell Spar, PharmD []  Royetta Asal, PharmD []  Graylin Shiver, Rph []  Rema Fendt) Glennon Mac, PharmD []  Arlyn Dunning, PharmD []  Netta Cedars, PharmD []  Dia Sitter, PharmD []  Leone Haven, PharmD []  Gretta Arab, PharmD []  Theodis Shove, PharmD []  Peggyann Juba, PharmD []  Reuel Boom, PharmD   Positive urine culture Treated with Cipro, organism sensitive to the same and no further patient follow-up is required at this time.  Genia Del 09/18/2019, 9:40 AM

## 2019-10-09 NOTE — Progress Notes (Signed)
HPI: FU atrial fibrillation. CardioNet in July of 2013 showed sinus bradycardia with occasional PAC. Nuclear study December 2016 showed ejection fraction 53%, prior infarct but no ischemia. Patient found to be in atrial fibrillation following surgical procedure in [facility]. Echo July 2018 showed normal LV function, grade 2 diastolic dysfunction, mild mitral regurgitation and mild left atrial enlargement. Plan was to proceed with cardioversion if atrial fibrillation persistedas this may have been postoperative atrial fibrillation.However he converted spontaneously.Patient had [facility] screening in September 2019 that showed mild carotid artery disease bilaterally, no abdominal aortic aneurysm and normal ABIs. Chest x-ray September 14, 2019 showed a 2 cm nodular density in the left midlung field.  CT recommended.  Since I last saw him,patient denies dyspnea, chest pain, palpitations or syncope.  No bleeding.  Current Outpatient Medications  Medication Sig Dispense Refill  . amLODipine (NORVASC) 10 MG tablet TAKE ONE TABLET BY MOUTH DAILY 90 tablet 3  . Ascorbic Acid (VITAMIN C PO) Take by mouth.    Marland Kitchen atorvastatin (LIPITOR) 40 MG tablet Take 40 mg by mouth daily. Study Drug with [facility] for five years    . Cholecalciferol (VITAMIN D3 PO) Take by mouth.    Arne Cleveland 2.5 MG TABS tablet TAKE ONE TABLET BY MOUTH TWICE A DAY 60 tablet 2  . metoprolol succinate (TOPROL-XL) 50 MG 24 hr tablet TAKE ONE TABLET BY MOUTH DAILY; NEEDS AN OFFICE VISIT FOR MORE REFILLS 90 tablet 0  . Psyllium (METAMUCIL PO) Take 1 tablespoonful with water every evening     No current facility-administered medications for this visit.     Past Medical History:  Diagnosis Date  . Asthma    childhood   . Basal cell cancer   . BPH (benign prostatic hyperplasia)   . GERD (gastroesophageal reflux disease)    occ  . Gout    changed diet years ago ; no gout attacks since   . Hypertension   .  Paroxysmal A-fib (HCC)    occurred post-operatively ; last EKG he was in NSR   . Renal insufficiency    non problematic     Past Surgical History:  Procedure Laterality Date  . EYE SURGERY  about 5-6 years ago    cataracts bilateral   . NOSE SURGERY    . Skin cancer removed    . TONSILLECTOMY    . TRANSURETHRAL RESECTION OF PROSTATE    . XI ROBOTIC ASSISTED SIMPLE PROSTATECTOMY N/A 12/14/2017   Procedure: XI ROBOTIC ASSISTED SIMPLE PROSTATECTOMY;  Surgeon: Cleon Gustin, MD;  Location: [facility];  Service: Urology;  Laterality: N/A;    Social History   Socioeconomic History  . Marital status: Married    Spouse name: Not on file  . Number of children: 6  . Years of education: Not on file  . Highest education level: Not on file  Occupational History    Comment: Engineer  Tobacco Use  . Smoking status: Former Smoker    Quit date: 10/17/1994    Years since quitting: 25.0  . Smokeless tobacco: Never Used  Substance and Sexual Activity  . Alcohol use: Yes    Comment: Occasional  . Drug use: No  . Sexual activity: Not on file  Other Topics Concern  . Not on file  Social History Narrative  . Not on file   Social Determinants of Health   Financial Resource Strain:   . Difficulty of Paying Living Expenses:   Food Insecurity:   .  Worried About Charity fundraiser in the Last Year:   . Arboriculturist in the Last Year:   Transportation Needs:   . Film/video editor (Medical):   Marland Kitchen Lack of Transportation (Non-Medical):   Physical Activity:   . Days of Exercise per Week:   . Minutes of Exercise per Session:   Stress:   . Feeling of Stress :   Social Connections:   . Frequency of Communication with Friends and Family:   . Frequency of Social Gatherings with Friends and Family:   . Attends Religious Services:   . Active Member of Clubs or Organizations:   . Attends Archivist Meetings:   Marland Kitchen Marital Status:   Intimate Partner Violence:   . Fear of Current  or Ex-Partner:   . Emotionally Abused:   Marland Kitchen Physically Abused:   . Sexually Abused:     Family History  Problem Relation Age of Onset  . Coronary artery disease Other        No family history    ROS: no fevers or chills, productive cough, hemoptysis, dysphasia, odynophagia, melena, hematochezia, dysuria, hematuria, rash, seizure activity, orthopnea, PND, pedal edema, claudication. Remaining systems are negative.  Physical Exam: Well-developed well-nourished in no acute distress.  Skin is warm and dry.  HEENT is normal.  Neck is supple.  Chest is clear to auscultation with normal expansion.  Cardiovascular exam is irregular Abdominal exam nontender or distended. No masses palpated. Extremities show no edema. neuro grossly intact   A/P  1 permanent atrial fibrillation-patient remains in atrial fibrillation on examination.  He is asymptomatic.  I will therefore plan rate control and anticoagulation.  Continue Toprol.  Continue apixaban.  Check hemoglobin and renal function.  2 hypertension-blood pressure controlled.  Continue present medical regimen.  3 palpitations-symptoms controlled.  Continue Toprol at present dose.  4 pulmonary nodule-noted on recent chest x-ray.  We will arrange noncontrast chest CT to further assess.  Kirk Ruths, MD

## 2019-10-16 ENCOUNTER — Ambulatory Visit: Payer: Medicare PPO | Attending: Internal Medicine

## 2019-10-16 ENCOUNTER — Encounter: Payer: Self-pay | Admitting: Cardiology

## 2019-10-16 ENCOUNTER — Other Ambulatory Visit: Payer: Self-pay

## 2019-10-16 ENCOUNTER — Ambulatory Visit: Payer: Medicare PPO | Admitting: Cardiology

## 2019-10-16 VITALS — BP 126/60 | HR 61 | Ht 73.0 in | Wt 201.0 lb

## 2019-10-16 DIAGNOSIS — I10 Essential (primary) hypertension: Secondary | ICD-10-CM

## 2019-10-16 DIAGNOSIS — R911 Solitary pulmonary nodule: Secondary | ICD-10-CM

## 2019-10-16 DIAGNOSIS — I4821 Permanent atrial fibrillation: Secondary | ICD-10-CM

## 2019-10-16 DIAGNOSIS — R002 Palpitations: Secondary | ICD-10-CM | POA: Diagnosis not present

## 2019-10-16 DIAGNOSIS — Z23 Encounter for immunization: Secondary | ICD-10-CM

## 2019-10-16 NOTE — Patient Instructions (Signed)
Medication Instructions:  NO CHANGE *If you need a refill on your cardiac medications before your next appointment, please call your pharmacy*   Lab Work: Your physician recommends that you HAVE LAB WORK TODAY If you have labs (blood work) drawn today and your tests are completely normal, you will receive your results only by: Marland Kitchen MyChart Message (if you have MyChart) OR . A paper copy in the mail If you have any lab test that is abnormal or we need to change your treatment, we will call you to review the results.   Testing/Procedures: CT WO CONTRAST TO FOLLOW UP ON LUNG NODULE AT THE [facility] [facility]   Follow-Up: At [facility], you and your health needs are our priority.  As part of our continuing mission to provide you with exceptional heart care, we have created designated Provider Care Teams.  These Care Teams include your primary Cardiologist (physician) and Advanced Practice Providers (APPs -  Physician Assistants and Nurse Practitioners) who all work together to provide you with the care you need, when you need it.  We recommend signing up for the patient portal called "MyChart".  Sign up information is provided on this After Visit Summary.  MyChart is used to connect with patients for Virtual Visits (Telemedicine).  Patients are able to view lab/test results, encounter notes, upcoming appointments, etc.  Non-urgent messages can be sent to your provider as well.   To learn more about what you can do with MyChart, go to [email or web address].    Your next appointment:   12 month(s)  The format for your next appointment:   Either In Person or Virtual  Provider:   Kirk Ruths, MD

## 2019-10-16 NOTE — Progress Notes (Signed)
   [id number]  Name:  Richard Vincent.    MRN: [id number] DOB: [birth year]  10/16/2019  Richard Vincent was observed post Covid-19 immunization for 15 minutes without incident. He was provided with Vaccine Information Sheet and instruction to access the V-Safe system.   Richard Vincent was instructed to call 911 with any severe reactions post vaccine: Marland Kitchen Difficulty breathing  . Swelling of face and throat  . A fast heartbeat  . A bad rash all over body  . Dizziness and weakness   Immunizations Administered    Name Date Dose VIS Date Route   Pfizer COVID-19 Vaccine 10/16/2019  2:33 PM 0.3 mL 06/28/2019 Intramuscular   Manufacturer: [place]   Lot: [id number]   [place]: [id number]

## 2019-10-17 LAB — BASIC METABOLIC PANEL
BUN/Creatinine Ratio: 13 (ref 10–24)
BUN: 22 mg/dL (ref 8–27)
CO2: 21 mmol/L (ref 20–29)
Calcium: 9.2 mg/dL (ref 8.6–10.2)
Chloride: 106 mmol/L (ref 96–106)
Creatinine, Ser: 1.72 mg/dL — ABNORMAL HIGH (ref 0.76–1.27)
GFR calc Af Amer: 41 mL/min/{1.73_m2} — ABNORMAL LOW (ref >59)
GFR calc non Af Amer: 35 mL/min/{1.73_m2} — ABNORMAL LOW (ref >59)
Glucose: 108 mg/dL — ABNORMAL HIGH (ref 65–99)
Potassium: 4.9 mmol/L (ref 3.5–5.2)
Sodium: 141 mmol/L (ref 134–144)

## 2019-10-17 LAB — CBC
Hematocrit: 44.3 % (ref 37.5–51.0)
Hemoglobin: 14.4 g/dL (ref 13.0–17.7)
MCH: 31.2 pg (ref 26.6–33.0)
MCHC: 32.5 g/dL (ref 31.5–35.7)
MCV: 96 fL (ref 79–97)
Platelets: 227 10*3/uL (ref 150–450)
RBC: 4.61 x10E6/uL (ref 4.14–5.80)
RDW: 12 % (ref 11.6–15.4)
WBC: 6.1 10*3/uL (ref 3.4–10.8)

## 2019-10-19 ENCOUNTER — Other Ambulatory Visit: Payer: Self-pay | Admitting: Cardiology

## 2019-10-23 ENCOUNTER — Other Ambulatory Visit: Payer: Self-pay

## 2019-10-23 ENCOUNTER — Ambulatory Visit (HOSPITAL_BASED_OUTPATIENT_CLINIC_OR_DEPARTMENT_OTHER)
Admission: RE | Admit: 2019-10-23 | Discharge: 2019-10-23 | Disposition: A | Discharge status: Home / Self Care | Payer: Medicare PPO | Source: Ambulatory Visit | Attending: Cardiology | Admitting: Cardiology

## 2019-10-23 DIAGNOSIS — R911 Solitary pulmonary nodule: Secondary | ICD-10-CM | POA: Diagnosis present

## 2019-10-24 ENCOUNTER — Telehealth: Payer: Self-pay | Admitting: Cardiology

## 2019-10-24 DIAGNOSIS — R911 Solitary pulmonary nodule: Secondary | ICD-10-CM

## 2019-10-24 NOTE — Telephone Encounter (Signed)
I need to see patient in the office and meet in person before scheduling the bronchoscopy  Thanks [name], DO [facility] 10/24/2019 5:58 PM

## 2019-10-24 NOTE — Telephone Encounter (Signed)
Left message for the pt to call back.   Spoke with the [facility] from [facility] [phone] and she reports that she could not find an appointment time for the pt in the next several days. She recommended that I forward to Dr. Lamonte Sakai and Dr. Valeta Harms for review and they may be able to work him in or give their recommendation.

## 2019-10-24 NOTE — Telephone Encounter (Signed)
Jane with [facility] called to report the pt had an abnormal chest CT yesterday ordered by Dr. Stanford Breed.. will forward to Dr. Audie Box the DOD for review since Dr. Stanford Breed is out of the office this week.

## 2019-10-24 NOTE — Telephone Encounter (Signed)
He needs urgent referral to pulmonology for likely lung cancer on my review of the imaging. Please refer him to [facility] pulmonary urgently.   [place] T. Audie Box, [place]  [address] [place], [place] [address] [phone]  9:51 AM

## 2019-10-24 NOTE — Telephone Encounter (Signed)
New Message:    Please call asap, have a  callt Report

## 2019-10-24 NOTE — Telephone Encounter (Signed)
Please schedule patient to see me in clinic Wed/Thurs of next week. Please place in nodule/mass slot.   I believe Rob is in the hospital next week unless he could work in tomorrow?   Images reviewed and current age and co-morbids we should discuss bronch in person.   He will need ENB to reach lesion.   [facility] 10/24/2019 4:30 PM

## 2019-10-24 NOTE — Telephone Encounter (Signed)
I will route to PCCs please provide .phrase so order can be placed

## 2019-10-25 NOTE — Telephone Encounter (Signed)
Msg sent to Dr Valeta Harms to help with scheduling of the patient

## 2019-10-25 NOTE — Telephone Encounter (Signed)
Richard Vincent will be working on the schedules to make a spot for the patient and will contact the patient

## 2019-10-25 NOTE — Telephone Encounter (Signed)
Noted. Will keep encounter open to follow up on pt's scheduled appt.

## 2019-10-25 NOTE — Telephone Encounter (Signed)
Patient scheduled for appt with BI on 10/31/19  Nothing further needed at this time

## 2019-10-31 ENCOUNTER — Other Ambulatory Visit: Payer: Self-pay

## 2019-10-31 ENCOUNTER — Ambulatory Visit: Payer: Medicare PPO | Admitting: Pulmonary Disease

## 2019-10-31 ENCOUNTER — Encounter: Payer: Self-pay | Admitting: Pulmonary Disease

## 2019-10-31 ENCOUNTER — Telehealth: Payer: Self-pay | Admitting: Pulmonary Disease

## 2019-10-31 VITALS — BP 118/62 | HR 62 | Temp 98.0°F | Wt 199.0 lb

## 2019-10-31 DIAGNOSIS — R918 Other nonspecific abnormal finding of lung field: Secondary | ICD-10-CM

## 2019-10-31 DIAGNOSIS — Z87891 Personal history of nicotine dependence: Secondary | ICD-10-CM | POA: Diagnosis not present

## 2019-10-31 DIAGNOSIS — J432 Centrilobular emphysema: Secondary | ICD-10-CM | POA: Diagnosis not present

## 2019-10-31 NOTE — Patient Instructions (Addendum)
Thank you for visiting Dr. Valeta Harms at [facility]. Today we recommend the following:  Orders Placed This Encounter  Procedures  . NM PET Image Initial (PI) Skull Base To Thigh  . Ambulatory referral to Pulmonology   Bronchoscopy planned for 11/12/2019 Stop Eliquis on Saturday before the procedure.  DO not eat past midnight the night before procedure   We will schedule your PET scan for ASAP.   Return in about 4 weeks (around 11/28/2019).    Please do your part to reduce the spread of COVID-19.

## 2019-10-31 NOTE — H&P (View-Only) (Signed)
Synopsis: Referred in April 2021 for lung mass by [name], DO  Subjective:   PATIENT ID: Richard Vincent. GENDER: male DOB: 06-Jan-1932, MRN: [id number]  Chief Complaint  Patient presents with  . Pulmonary Consult    Referred by Dr. Stanford Breed for Lung nodule.    This is an 86 year old gentleman past medical history of asthma, hypertension, A. fib on anticoagulation, gastroesophageal reflux disease.  Patient was referred to our clinic for abnormality and CT imaging, CT of the chest on 10/23/2019 which revealed a new spiculated 3 cm left upper lobe lung mass concerning for primary bronchogenic carcinoma.  Patient is a former smoker quit 1996. He has no complaints today. Overall has a good functional status. Able to complete all of his activities of daily living. He lives with his wife. Able to push mow the yard. He is on Eliquis for stroke prevention and atrial fibrillation.   Past Medical History:  Diagnosis Date  . Asthma    childhood   . Basal cell cancer   . BPH (benign prostatic hyperplasia)   . GERD (gastroesophageal reflux disease)    occ  . Gout    changed diet years ago ; no gout attacks since   . Hypertension   . Paroxysmal A-fib (HCC)    occurred post-operatively ; last EKG he was in NSR   . Renal insufficiency    non problematic      Family History  Problem Relation Age of Onset  . Coronary artery disease Other        No family history     Past Surgical History:  Procedure Laterality Date  . EYE SURGERY  about 5-6 years ago    cataracts bilateral   . NOSE SURGERY    . Skin cancer removed    . TONSILLECTOMY    . TRANSURETHRAL RESECTION OF PROSTATE    . XI ROBOTIC ASSISTED SIMPLE PROSTATECTOMY N/A 12/14/2017   Procedure: XI ROBOTIC ASSISTED SIMPLE PROSTATECTOMY;  Surgeon: Cleon Gustin, MD;  Location: [facility];  Service: Urology;  Laterality: N/A;    Social History   Socioeconomic History  . Marital status: Married    Spouse name: Not on  file  . Number of children: 6  . Years of education: Not on file  . Highest education level: Not on file  Occupational History    Comment: Engineer  Tobacco Use  . Smoking status: Former Smoker    Packs/day: 1.00    Years: 30.00    Pack years: 30.00    Types: Cigarettes    Quit date: 10/17/1994    Years since quitting: 25.0  . Smokeless tobacco: Never Used  Substance and Sexual Activity  . Alcohol use: Yes    Comment: Occasional  . Drug use: No  . Sexual activity: Not on file  Other Topics Concern  . Not on file  Social History Narrative  . Not on file   Social Determinants of Health   Financial Resource Strain:   . Difficulty of Paying Living Expenses:   Food Insecurity:   . Worried About Charity fundraiser in the Last Year:   . Arboriculturist in the Last Year:   Transportation Needs:   . Film/video editor (Medical):   Marland Kitchen Lack of Transportation (Non-Medical):   Physical Activity:   . Days of Exercise per Week:   . Minutes of Exercise per Session:   Stress:   . Feeling of Stress :  Social Connections:   . Frequency of Communication with Friends and Family:   . Frequency of Social Gatherings with Friends and Family:   . Attends Religious Services:   . Active Member of Clubs or Organizations:   . Attends Archivist Meetings:   Marland Kitchen Marital Status:   Intimate Partner Violence:   . Fear of Current or Ex-Partner:   . Emotionally Abused:   Marland Kitchen Physically Abused:   . Sexually Abused:      Allergies  Allergen Reactions  . Latex     Mild rash     Outpatient Medications Prior to Visit  Medication Sig Dispense Refill  . amLODipine (NORVASC) 10 MG tablet TAKE ONE TABLET BY MOUTH DAILY 90 tablet 3  . Ascorbic Acid (VITAMIN C PO) Take by mouth.    Marland Kitchen atorvastatin (LIPITOR) 40 MG tablet Take 40 mg by mouth daily. Study Drug with [facility] for five years    . Cholecalciferol (VITAMIN D3 PO) Take by mouth.    Arne Cleveland 2.5 MG TABS tablet TAKE ONE TABLET BY  MOUTH TWICE A DAY 60 tablet 2  . metoprolol succinate (TOPROL-XL) 50 MG 24 hr tablet TAKE ONE TABLET BY MOUTH DAILY; OFFICE VISIT NEEDED FOR MORE REFILLS 90 tablet 3  . Psyllium (METAMUCIL PO) Take 1 tablespoonful with water every evening     No facility-administered medications prior to visit.    Review of Systems  Constitutional: Negative for chills, fever, malaise/fatigue and weight loss.  HENT: Negative for hearing loss, sore throat and tinnitus.   Eyes: Negative for blurred vision and double vision.  Respiratory: Positive for cough. Negative for hemoptysis, sputum production, shortness of breath, wheezing and stridor.   Cardiovascular: Negative for chest pain, palpitations, orthopnea, leg swelling and PND.  Gastrointestinal: Negative for abdominal pain, constipation, diarrhea, heartburn, nausea and vomiting.  Genitourinary: Negative for dysuria, hematuria and urgency.  Musculoskeletal: Negative for joint pain and myalgias.  Skin: Negative for itching and rash.  Neurological: Negative for dizziness, tingling, weakness and headaches.  Endo/Heme/Allergies: Negative for environmental allergies. Does not bruise/bleed easily.  Psychiatric/Behavioral: Negative for depression. The patient is not nervous/anxious and does not have insomnia.   All other systems reviewed and are negative.    Objective:  Physical Exam Vitals reviewed.  Constitutional:      General: He is not in acute distress.    Appearance: He is well-developed.  HENT:     Head: Normocephalic and atraumatic.  Eyes:     General: No scleral icterus.    Conjunctiva/sclera: Conjunctivae normal.     Pupils: Pupils are equal, round, and reactive to light.  Neck:     Vascular: No JVD.     Trachea: No tracheal deviation.  Cardiovascular:     Rate and Rhythm: Normal rate. Rhythm irregular.     Heart sounds: Normal heart sounds. No murmur.  Pulmonary:     Effort: Pulmonary effort is normal. No tachypnea, accessory muscle  usage or respiratory distress.     Breath sounds: Normal breath sounds. No stridor. No wheezing, rhonchi or rales.  Abdominal:     General: Bowel sounds are normal. There is no distension.     Palpations: Abdomen is soft.     Tenderness: There is no abdominal tenderness.  Musculoskeletal:        General: No tenderness.     Cervical back: Neck supple.  Lymphadenopathy:     Cervical: No cervical adenopathy.  Skin:    General: Skin is warm  and dry.     Capillary Refill: Capillary refill takes less than 2 seconds.     Findings: No rash.  Neurological:     Mental Status: He is alert and oriented to person, place, and time.  Psychiatric:        Behavior: Behavior normal.      Vitals:   10/31/19 1354  BP: 118/62  Pulse: 62  Temp: 98 F (36.7 C)  TempSrc: Temporal  SpO2: 98%  Weight: 199 lb (90.3 kg)   98% on RA BMI Readings from Last 3 Encounters:  10/31/19 26.25 kg/m  10/16/19 26.52 kg/m  09/14/19 26.91 kg/m   Wt Readings from Last 3 Encounters:  10/31/19 199 lb (90.3 kg)  10/16/19 201 lb (91.2 kg)  09/14/19 204 lb (92.5 kg)     CBC    Component Value Date/Time   WBC 6.1 10/16/2019 1003   WBC 6.2 12/07/2017 1358   RBC 4.61 10/16/2019 1003   RBC 4.32 12/07/2017 1358   HGB 14.4 10/16/2019 1003   HCT 44.3 10/16/2019 1003   PLT 227 10/16/2019 1003   MCV 96 10/16/2019 1003   MCH 31.2 10/16/2019 1003   MCH 30.6 12/07/2017 1358   MCHC 32.5 10/16/2019 1003   MCHC 32.8 12/07/2017 1358   RDW 12.0 10/16/2019 1003     Chest Imaging: CT chest 10/23/2019: Spiculated 3 cm left upper lobe lung mass concerning for primary bronchogenic carcinoma. The patient's images have been independently reviewed by me.     Pulmonary Functions Testing Results: No flowsheet data found.  FeNO: none   Pathology: none   Echocardiogram:   Study Conclusions   - Left ventricle: The cavity size was normal. Wall thickness was  increased in a pattern of moderate LVH. Systolic  function was  normal. The estimated ejection fraction was in the range of 55%  to 60%. Wall motion was normal; there were no regional wall  motion abnormalities. Features are consistent with a pseudonormal  left ventricular filling pattern, with concomitant abnormal  relaxation and increased filling pressure (grade 2 diastolic  dysfunction).  - Aortic valve: Transvalvular velocity was minimally increased.  There was no stenosis. Peak velocity (S): 214 cm/s.  - Mitral valve: There was mild regurgitation.  - Left atrium: The atrium was mildly dilated.  - Tricuspid valve: There was mild regurgitation.  - Pulmonary arteries: Systolic pressure was mildly increased. PA  peak pressure: 34 mm Hg (S).   Heart Catheterization: none     Assessment & Plan:     ICD-10-CM   1. Mass of upper lobe of left lung  R91.8 Ambulatory referral to Pulmonology    NM PET Image Initial (PI) Skull Base To Thigh  2. Former smoker  Z87.891   3. Centrilobular emphysema ([place])  J43.2     Discussion: This is a 86 year old gentleman former smoker, evidence of centrilobular emphysema on CT imaging. Found to have an incidental 3.4 cm left upper lobe mass concerning for primary bronchogenic carcinoma  Assessment:   This is a new diagnosis, new lung mass concerning for a primary lung cancer  Plan Following Extensive Data Review & Interpretation:  . I reviewed prior external note(s) from 10/16/2019 Dr. Stanford Breed cardiology . I reviewed the result(s) of echocardiogram 02/01/2017, BMP 10/16/2019 elevated serum creatinine . I have ordered nuclear medicine pet imaging  Independent interpretation of tests . Review of patient's 10/23/2019 CT chest images revealed 3 cm left upper lobe lung mass concerning for primary bronchogenic carcinoma. The patient's  images have been independently reviewed by me.    Today we discussed the risk benefits and alternatives of proceeding with a invasive tissue diagnosis. This  includes video bronchoscopy with endobronchial navigation. Likely fiducial placement for planned SBRT. We discussed potential to include percutaneous needle biopsy versus surgical lung biopsy or lobectomy for removal. Patient is not interested at his age to undergo surgical procedure regarding his lung mass. Patient is on Eliquis due to stroke prevention for atrial fibrillation. Patient will need to stop this prior to his procedure.  Tentative plan bronchoscopy 11/12/2019 for electromagnetic navigation at [facility] endoscopy. Stop Eliquis Saturday before procedure. We will need to obtain CT imaging from he has a recent April CT scan to be converted to 1 mm cut super D format.    Current Outpatient Medications:  .  amLODipine (NORVASC) 10 MG tablet, TAKE ONE TABLET BY MOUTH DAILY, Disp: 90 tablet, Rfl: 3 .  Ascorbic Acid (VITAMIN C PO), Take by mouth., Disp: , Rfl:  .  atorvastatin (LIPITOR) 40 MG tablet, Take 40 mg by mouth daily. Study Drug with [facility] for five years, Disp: , Rfl:  .  Cholecalciferol (VITAMIN D3 PO), Take by mouth., Disp: , Rfl:  .  ELIQUIS 2.5 MG TABS tablet, TAKE ONE TABLET BY MOUTH TWICE A DAY, Disp: 60 tablet, Rfl: 2 .  metoprolol succinate (TOPROL-XL) 50 MG 24 hr tablet, TAKE ONE TABLET BY MOUTH DAILY; OFFICE VISIT NEEDED FOR MORE REFILLS, Disp: 90 tablet, Rfl: 3 .  Psyllium (METAMUCIL PO), Take 1 tablespoonful with water every evening, Disp: , Rfl:    [name], DO [place] 10/31/2019 2:19 PM

## 2019-10-31 NOTE — Addendum Note (Signed)
Addended by: [name] on: 10/31/2019 03:20 PM   Modules accepted: Orders

## 2019-10-31 NOTE — Progress Notes (Signed)
Synopsis: Referred in April 2021 for lung mass by [name], DO  Subjective:   PATIENT ID: Richard Vincent. GENDER: male DOB: 16-Aug-1931, MRN: [id number]  Chief Complaint  Patient presents with  . Pulmonary Consult    Referred by Dr. Stanford Breed for Lung nodule.    This is an 86 year old gentleman past medical history of asthma, hypertension, A. fib on anticoagulation, gastroesophageal reflux disease.  Patient was referred to our clinic for abnormality and CT imaging, CT of the chest on 10/23/2019 which revealed a new spiculated 3 cm left upper lobe lung mass concerning for primary bronchogenic carcinoma.  Patient is a former smoker quit 1996. He has no complaints today. Overall has a good functional status. Able to complete all of his activities of daily living. He lives with his wife. Able to push mow the yard. He is on Eliquis for stroke prevention and atrial fibrillation.   Past Medical History:  Diagnosis Date  . Asthma    childhood   . Basal cell cancer   . BPH (benign prostatic hyperplasia)   . GERD (gastroesophageal reflux disease)    occ  . Gout    changed diet years ago ; no gout attacks since   . Hypertension   . Paroxysmal A-fib (HCC)    occurred post-operatively ; last EKG he was in NSR   . Renal insufficiency    non problematic      Family History  Problem Relation Age of Onset  . Coronary artery disease Other        No family history     Past Surgical History:  Procedure Laterality Date  . EYE SURGERY  about 5-6 years ago    cataracts bilateral   . NOSE SURGERY    . Skin cancer removed    . TONSILLECTOMY    . TRANSURETHRAL RESECTION OF PROSTATE    . XI ROBOTIC ASSISTED SIMPLE PROSTATECTOMY N/A 12/14/2017   Procedure: XI ROBOTIC ASSISTED SIMPLE PROSTATECTOMY;  Surgeon: Cleon Gustin, MD;  Location: [facility];  Service: Urology;  Laterality: N/A;    Social History   Socioeconomic History  . Marital status: Married    Spouse name: Not on  file  . Number of children: 6  . Years of education: Not on file  . Highest education level: Not on file  Occupational History    Comment: Engineer  Tobacco Use  . Smoking status: Former Smoker    Packs/day: 1.00    Years: 30.00    Pack years: 30.00    Types: Cigarettes    Quit date: 10/17/1994    Years since quitting: 25.0  . Smokeless tobacco: Never Used  Substance and Sexual Activity  . Alcohol use: Yes    Comment: Occasional  . Drug use: No  . Sexual activity: Not on file  Other Topics Concern  . Not on file  Social History Narrative  . Not on file   Social Determinants of Health   Financial Resource Strain:   . Difficulty of Paying Living Expenses:   Food Insecurity:   . Worried About Charity fundraiser in the Last Year:   . Arboriculturist in the Last Year:   Transportation Needs:   . Film/video editor (Medical):   Marland Kitchen Lack of Transportation (Non-Medical):   Physical Activity:   . Days of Exercise per Week:   . Minutes of Exercise per Session:   Stress:   . Feeling of Stress :  Social Connections:   . Frequency of Communication with Friends and Family:   . Frequency of Social Gatherings with Friends and Family:   . Attends Religious Services:   . Active Member of Clubs or Organizations:   . Attends Archivist Meetings:   Marland Kitchen Marital Status:   Intimate Partner Violence:   . Fear of Current or Ex-Partner:   . Emotionally Abused:   Marland Kitchen Physically Abused:   . Sexually Abused:      Allergies  Allergen Reactions  . Latex     Mild rash     Outpatient Medications Prior to Visit  Medication Sig Dispense Refill  . amLODipine (NORVASC) 10 MG tablet TAKE ONE TABLET BY MOUTH DAILY 90 tablet 3  . Ascorbic Acid (VITAMIN C PO) Take by mouth.    Marland Kitchen atorvastatin (LIPITOR) 40 MG tablet Take 40 mg by mouth daily. Study Drug with [facility] for five years    . Cholecalciferol (VITAMIN D3 PO) Take by mouth.    Arne Cleveland 2.5 MG TABS tablet TAKE ONE TABLET BY  MOUTH TWICE A DAY 60 tablet 2  . metoprolol succinate (TOPROL-XL) 50 MG 24 hr tablet TAKE ONE TABLET BY MOUTH DAILY; OFFICE VISIT NEEDED FOR MORE REFILLS 90 tablet 3  . Psyllium (METAMUCIL PO) Take 1 tablespoonful with water every evening     No facility-administered medications prior to visit.    Review of Systems  Constitutional: Negative for chills, fever, malaise/fatigue and weight loss.  HENT: Negative for hearing loss, sore throat and tinnitus.   Eyes: Negative for blurred vision and double vision.  Respiratory: Positive for cough. Negative for hemoptysis, sputum production, shortness of breath, wheezing and stridor.   Cardiovascular: Negative for chest pain, palpitations, orthopnea, leg swelling and PND.  Gastrointestinal: Negative for abdominal pain, constipation, diarrhea, heartburn, nausea and vomiting.  Genitourinary: Negative for dysuria, hematuria and urgency.  Musculoskeletal: Negative for joint pain and myalgias.  Skin: Negative for itching and rash.  Neurological: Negative for dizziness, tingling, weakness and headaches.  Endo/Heme/Allergies: Negative for environmental allergies. Does not bruise/bleed easily.  Psychiatric/Behavioral: Negative for depression. The patient is not nervous/anxious and does not have insomnia.   All other systems reviewed and are negative.    Objective:  Physical Exam Vitals reviewed.  Constitutional:      General: He is not in acute distress.    Appearance: He is well-developed.  HENT:     Head: Normocephalic and atraumatic.  Eyes:     General: No scleral icterus.    Conjunctiva/sclera: Conjunctivae normal.     Pupils: Pupils are equal, round, and reactive to light.  Neck:     Vascular: No JVD.     Trachea: No tracheal deviation.  Cardiovascular:     Rate and Rhythm: Normal rate. Rhythm irregular.     Heart sounds: Normal heart sounds. No murmur.  Pulmonary:     Effort: Pulmonary effort is normal. No tachypnea, accessory muscle  usage or respiratory distress.     Breath sounds: Normal breath sounds. No stridor. No wheezing, rhonchi or rales.  Abdominal:     General: Bowel sounds are normal. There is no distension.     Palpations: Abdomen is soft.     Tenderness: There is no abdominal tenderness.  Musculoskeletal:        General: No tenderness.     Cervical back: Neck supple.  Lymphadenopathy:     Cervical: No cervical adenopathy.  Skin:    General: Skin is warm  and dry.     Capillary Refill: Capillary refill takes less than 2 seconds.     Findings: No rash.  Neurological:     Mental Status: He is alert and oriented to person, place, and time.  Psychiatric:        Behavior: Behavior normal.      Vitals:   10/31/19 1354  BP: 118/62  Pulse: 62  Temp: 98 F (36.7 C)  TempSrc: Temporal  SpO2: 98%  Weight: 199 lb (90.3 kg)   98% on RA BMI Readings from Last 3 Encounters:  10/31/19 26.25 kg/m  10/16/19 26.52 kg/m  09/14/19 26.91 kg/m   Wt Readings from Last 3 Encounters:  10/31/19 199 lb (90.3 kg)  10/16/19 201 lb (91.2 kg)  09/14/19 204 lb (92.5 kg)     CBC    Component Value Date/Time   WBC 6.1 10/16/2019 1003   WBC 6.2 12/07/2017 1358   RBC 4.61 10/16/2019 1003   RBC 4.32 12/07/2017 1358   HGB 14.4 10/16/2019 1003   HCT 44.3 10/16/2019 1003   PLT 227 10/16/2019 1003   MCV 96 10/16/2019 1003   MCH 31.2 10/16/2019 1003   MCH 30.6 12/07/2017 1358   MCHC 32.5 10/16/2019 1003   MCHC 32.8 12/07/2017 1358   RDW 12.0 10/16/2019 1003     Chest Imaging: CT chest 10/23/2019: Spiculated 3 cm left upper lobe lung mass concerning for primary bronchogenic carcinoma. The patient's images have been independently reviewed by me.     Pulmonary Functions Testing Results: No flowsheet data found.  FeNO: none   Pathology: none   Echocardiogram:   Study Conclusions   - Left ventricle: The cavity size was normal. Wall thickness was  increased in a pattern of moderate LVH. Systolic  function was  normal. The estimated ejection fraction was in the range of 55%  to 60%. Wall motion was normal; there were no regional wall  motion abnormalities. Features are consistent with a pseudonormal  left ventricular filling pattern, with concomitant abnormal  relaxation and increased filling pressure (grade 2 diastolic  dysfunction).  - Aortic valve: Transvalvular velocity was minimally increased.  There was no stenosis. Peak velocity (S): 214 cm/s.  - Mitral valve: There was mild regurgitation.  - Left atrium: The atrium was mildly dilated.  - Tricuspid valve: There was mild regurgitation.  - Pulmonary arteries: Systolic pressure was mildly increased. PA  peak pressure: 34 mm Hg (S).   Heart Catheterization: none     Assessment & Plan:     ICD-10-CM   1. Mass of upper lobe of left lung  R91.8 Ambulatory referral to Pulmonology    NM PET Image Initial (PI) Skull Base To Thigh  2. Former smoker  Z87.891   3. Centrilobular emphysema ([place])  J43.2     Discussion: This is a 86 year old gentleman former smoker, evidence of centrilobular emphysema on CT imaging. Found to have an incidental 3.4 cm left upper lobe mass concerning for primary bronchogenic carcinoma  Assessment:   This is a new diagnosis, new lung mass concerning for a primary lung cancer  Plan Following Extensive Data Review & Interpretation:  . I reviewed prior external note(s) from 10/16/2019 Dr. Stanford Breed cardiology . I reviewed the result(s) of echocardiogram 02/01/2017, BMP 10/16/2019 elevated serum creatinine . I have ordered nuclear medicine pet imaging  Independent interpretation of tests . Review of patient's 10/23/2019 CT chest images revealed 3 cm left upper lobe lung mass concerning for primary bronchogenic carcinoma. The patient's  images have been independently reviewed by me.    Today we discussed the risk benefits and alternatives of proceeding with a invasive tissue diagnosis. This  includes video bronchoscopy with endobronchial navigation. Likely fiducial placement for planned SBRT. We discussed potential to include percutaneous needle biopsy versus surgical lung biopsy or lobectomy for removal. Patient is not interested at his age to undergo surgical procedure regarding his lung mass. Patient is on Eliquis due to stroke prevention for atrial fibrillation. Patient will need to stop this prior to his procedure.  Tentative plan bronchoscopy 11/12/2019 for electromagnetic navigation at [facility] endoscopy. Stop Eliquis Saturday before procedure. We will need to obtain CT imaging from he has a recent April CT scan to be converted to 1 mm cut super D format.    Current Outpatient Medications:  .  amLODipine (NORVASC) 10 MG tablet, TAKE ONE TABLET BY MOUTH DAILY, Disp: 90 tablet, Rfl: 3 .  Ascorbic Acid (VITAMIN C PO), Take by mouth., Disp: , Rfl:  .  atorvastatin (LIPITOR) 40 MG tablet, Take 40 mg by mouth daily. Study Drug with [facility] for five years, Disp: , Rfl:  .  Cholecalciferol (VITAMIN D3 PO), Take by mouth., Disp: , Rfl:  .  ELIQUIS 2.5 MG TABS tablet, TAKE ONE TABLET BY MOUTH TWICE A DAY, Disp: 60 tablet, Rfl: 2 .  metoprolol succinate (TOPROL-XL) 50 MG 24 hr tablet, TAKE ONE TABLET BY MOUTH DAILY; OFFICE VISIT NEEDED FOR MORE REFILLS, Disp: 90 tablet, Rfl: 3 .  Psyllium (METAMUCIL PO), Take 1 tablespoonful with water every evening, Disp: , Rfl:    [name], DO [facility] 10/31/2019 2:19 PM

## 2019-10-31 NOTE — Telephone Encounter (Signed)
Dr. Valeta Harms is aware. He has placed another order to have this done. After speaking to [name] is did reach out to see if she could get the Super D disc made and she is awaiting a call back before scheduling the super D CT.

## 2019-11-05 NOTE — Telephone Encounter (Signed)
Yes, they were not able to get disc from previous CT.  Pt is scheduled for Super D tomorrow.  He is aware.

## 2019-11-05 NOTE — Telephone Encounter (Signed)
[name] have you heard anything back about this?

## 2019-11-06 ENCOUNTER — Other Ambulatory Visit: Payer: Self-pay

## 2019-11-06 ENCOUNTER — Telehealth: Payer: Self-pay | Admitting: Pulmonary Disease

## 2019-11-06 ENCOUNTER — Ambulatory Visit (INDEPENDENT_AMBULATORY_CARE_PROVIDER_SITE_OTHER)
Admission: RE | Admit: 2019-11-06 | Discharge: 2019-11-06 | Disposition: A | Discharge status: Home / Self Care | Payer: Medicare PPO | Source: Ambulatory Visit | Attending: Pulmonary Disease | Admitting: Pulmonary Disease

## 2019-11-06 DIAGNOSIS — R918 Other nonspecific abnormal finding of lung field: Secondary | ICD-10-CM

## 2019-11-06 NOTE — Telephone Encounter (Signed)
Pt is scheduled for a bronch Tuesday 4/27. Due to a cancellation, pt's bronch has been moved up to 11:15 and per Dr. Valeta Harms, pt needs to arrive at [facility] by [place] and spoke with pt letting him know this info and pt verbalized understanding. Nothing further needed.

## 2019-11-09 ENCOUNTER — Other Ambulatory Visit (HOSPITAL_COMMUNITY)
Admission: RE | Admit: 2019-11-09 | Discharge: 2019-11-09 | Disposition: A | Discharge status: Home / Self Care | Payer: Medicare PPO | Source: Ambulatory Visit | Attending: Pulmonary Disease | Admitting: Pulmonary Disease

## 2019-11-09 DIAGNOSIS — Z01812 Encounter for preprocedural laboratory examination: Secondary | ICD-10-CM | POA: Insufficient documentation

## 2019-11-09 DIAGNOSIS — Z20822 Contact with and (suspected) exposure to covid-19: Secondary | ICD-10-CM | POA: Diagnosis not present

## 2019-11-09 LAB — SARS CORONAVIRUS 2 (TAT 6-24 HRS): SARS Coronavirus 2: NEGATIVE

## 2019-11-11 ENCOUNTER — Other Ambulatory Visit: Payer: Self-pay

## 2019-11-11 ENCOUNTER — Ambulatory Visit (HOSPITAL_COMMUNITY)
Admission: RE | Admit: 2019-11-11 | Discharge: 2019-11-11 | Disposition: A | Discharge status: Home / Self Care | Payer: Medicare PPO | Source: Ambulatory Visit | Attending: Pulmonary Disease | Admitting: Pulmonary Disease

## 2019-11-11 ENCOUNTER — Encounter (HOSPITAL_COMMUNITY): Payer: Self-pay | Admitting: Pulmonary Disease

## 2019-11-11 DIAGNOSIS — I700 Atherosclerosis of aorta: Secondary | ICD-10-CM | POA: Insufficient documentation

## 2019-11-11 DIAGNOSIS — K8020 Calculus of gallbladder without cholecystitis without obstruction: Secondary | ICD-10-CM | POA: Diagnosis not present

## 2019-11-11 DIAGNOSIS — R918 Other nonspecific abnormal finding of lung field: Secondary | ICD-10-CM | POA: Insufficient documentation

## 2019-11-11 DIAGNOSIS — I77811 Abdominal aortic ectasia: Secondary | ICD-10-CM | POA: Diagnosis not present

## 2019-11-11 LAB — GLUCOSE, CAPILLARY: Glucose-Capillary: 89 mg/dL (ref 70–99)

## 2019-11-11 MED ORDER — FLUDEOXYGLUCOSE F - 18 (FDG) INJECTION: 9.9000 | Freq: Once | INTRAVENOUS | Status: DC | PRN

## 2019-11-11 NOTE — Progress Notes (Signed)
Anesthesia Chart Review: Richard Vincent   Case: [id number] Date/Time: 11/12/19 1115   Procedure: VIDEO BRONCHOSCOPY WITH ENDOBRONCHIAL NAVIGATION (N/A )   Anesthesia type: General   Pre-op diagnosis: LEFT LUNG MASS   Location: [facility]   Surgeons: [name], DO      DISCUSSION: Patient is an 86 year old male scheduled for the above procedure. He was referred to pulmonology by cardiology for further evaluation of a LUL lung nodule.   History includes former smoker (quit 10/17/94), HTN, PAF/afib (diagnosed 01/2017), GERD, childhood asthma, renal insufficiency, BPH/urinary retention (s/p Robotic assisted prostatectomy 12/14/17), skin cancer/nasal defect (s/p forehead flap, septal flap 2018).  Last cardiology evaluation 10/16/19 with Dr. Stanford Breed. He was back in rate controlled afib at 06/19/19 visit, but asymptomatic.  Management with rate control and anticoagulation recommended--He was already on anticoagulation and b-blocker.   He is on Eliquis for stroke prevention due to afib. Per Dr. Valeta Harms, patient to stop Eliquis for surgery starting 11/09/19.   S/p 2nd [place] COVID-19 vaccine 10/16/19.  11/09/19 COVID-19 test negative. Anesthesia team to evaluate on the day of surgery.    VS: As of 10/31/19, WT 90.3 kg, BP 118/62, HR 62    PROVIDERS: No PCP is listed Kirk Ruths, MD is cardiologist   LABS: He is for labs on arrival. As of 10/16/19, Cr 1.72 (range of 1.60-1.77 12/15/17-02/11/19), H/H 14.4/44.3, PLT 227, glucose 108.   IMAGES: PET Scan 11/11/19: IMPRESSION: 1. Hypermetabolic lingular nodule with mildly hypermetabolic ipsilateral mediastinal/hilar lymph nodes. Findings are indicative of primary bronchogenic carcinoma (likely T1cN2M0 or stage IIIA disease). 2. Cholelithiasis. 3. Ectatic abdominal aorta at risk for aneurysm development. Recommend followup by ultrasound in 5 years. This recommendation follows ACR consensus guidelines: White Paper of the ACR  Incidental Findings Committee II on Vascular Findings. J Am Coll Radiol 2013; 85:277-824. 4.  Aortic atherosclerosis (ICD10-I70.0).  CT Super D Chest 11/06/19: IMPRESSION: 1. Similar macrolobulated and spiculated left upper lobe mass which measures 2.2 x 3.3 x 1.7 cm. 2. Stable 3 mm subpleural nodules in the right lower lobe, nonspecific, but statistically likely benign subpleural lymph nodes. 3. Diffuse bronchial wall thickening with mild centrilobular and paraseptal emphysema; imaging findings suggestive of underlying COPD. 4. Aortic atherosclerosis, in addition to left main and 3 vessel coronary artery disease. 5. There are calcifications of the aortic valve. Echocardiographic correlation for evaluation of potential valvular dysfunction may be warranted if clinically indicated. 6. Additional incidental findings, as above.   EKG: 06/19/19 ([facility]): Afib at 60 bpm. Low voltage QRS.    CV: Echo 02/01/17: Study Conclusions  - Left ventricle: The cavity size was normal. Wall thickness was  increased in a pattern of moderate LVH. Systolic function was  normal. The estimated ejection fraction was in the range of 55%  to 60%. Wall motion was normal; there were no regional wall  motion abnormalities. Features are consistent with a pseudonormal  left ventricular filling pattern, with concomitant abnormal  relaxation and increased filling pressure (grade 2 diastolic  dysfunction).  - Aortic valve: Transvalvular velocity was minimally increased.  There was no stenosis. Peak velocity (S): 214 cm/s.  - Mitral valve: There was mild regurgitation.  - Left atrium: The atrium was mildly dilated.  - Tricuspid valve: There was mild regurgitation.  - Pulmonary arteries: Systolic pressure was mildly increased. PA  peak pressure: 34 mm Hg (S).  Impressions:  - Compared to the prior study, there has been no significant  interval change.    Nuclear stress test  07/16/15: Study Highlights  Nuclear stress EF: 53%.  The left ventricular ejection fraction is mildly decreased (45-54%).  There was no ST segment deviation noted during stress.  This is a low risk study.  Defect 1: There is a small defect of moderate severity present in the apical anterior and apical septal location consistent with prior infarct. There is no ischemia.  Findings consistent with prior myocardial infarction.  - Results reviewed by Dr. Stanford Breed on 07/17/15 and wrote, "Findings may be consistent with attenuation artifact, no ischemia"   According to 10/16/19 note by Dr. Stanford Breed, "[facility] screening in September 2019 that showed mild carotid artery disease bilaterally, no abdominal aortic aneurysm and normal ABIs"   Past Medical History:  Diagnosis Date  . Asthma    childhood   . Basal cell cancer   . BPH (benign prostatic hyperplasia)   . GERD (gastroesophageal reflux disease)    occ  . Gout    changed diet years ago ; no gout attacks since   . Hypertension   . Paroxysmal A-fib (HCC)    occurred post-operatively ; last EKG he was in NSR   . Renal insufficiency    non problematic     Past Surgical History:  Procedure Laterality Date  . EYE SURGERY  about 5-6 years ago    cataracts bilateral   . NOSE SURGERY    . Skin cancer removed    . TONSILLECTOMY    . TRANSURETHRAL RESECTION OF PROSTATE    . XI ROBOTIC ASSISTED SIMPLE PROSTATECTOMY N/A 12/14/2017   Procedure: XI ROBOTIC ASSISTED SIMPLE PROSTATECTOMY;  Surgeon: Cleon Gustin, MD;  Location: [facility];  Service: Urology;  Laterality: N/A;    MEDICATIONS: No current facility-administered medications for this encounter.   Marland Kitchen amLODipine (NORVASC) 10 MG tablet  . ascorbic acid (VITAMIN C) 500 MG tablet  . cholecalciferol (VITAMIN D3) 25 MCG (1000 UNIT) tablet  . ELIQUIS 2.5 MG TABS tablet  . Investigational - Study Medication  . magnesium hydroxide (MILK OF MAGNESIA) 400 MG/5ML suspension  .  metoprolol succinate (TOPROL-XL) 50 MG 24 hr tablet  . Psyllium (METAMUCIL PO)   . fludeoxyglucose F - 18 (FDG) injection 9.9 millicurie  Investigations study medication: Atorvastatin 40 mg or placebo   Myra Gianotti, PA-C Surgical Short Stay/Anesthesiology [facility] Phone [phone] [facility] Phone [phone] 11/11/2019 2:12 PM

## 2019-11-11 NOTE — Anesthesia Preprocedure Evaluation (Addendum)
Anesthesia Evaluation  Patient identified by MRN, date of birth, ID band Patient awake    Reviewed: Allergy & Precautions, NPO status , Patient's Chart, lab work & pertinent test results  Airway Mallampati: II  TM Distance: >3 FB     Dental   Pulmonary COPD, former smoker,    breath sounds clear to auscultation       Cardiovascular hypertension,  Rhythm:Regular Rate:Normal     Neuro/Psych    GI/Hepatic Neg liver ROS, GERD  ,  Endo/Other    Renal/GU Renal disease     Musculoskeletal   Abdominal   Peds  Hematology   Anesthesia Other Findings   Reproductive/Obstetrics                             Anesthesia Physical Anesthesia Plan  ASA: III  Anesthesia Plan: General   Post-op Pain Management:    Induction: Intravenous  PONV Risk Score and Plan: 2 and Ondansetron, Dexamethasone and Midazolam  Airway Management Planned: Oral ETT  Additional Equipment:   Intra-op Plan:   Post-operative Plan: Possible Post-op intubation/ventilation  Informed Consent: I have reviewed the patients History and Physical, chart, labs and discussed the procedure including the risks, benefits and alternatives for the proposed anesthesia with the patient or authorized representative who has indicated his/her understanding and acceptance.     Dental advisory given  Plan Discussed with: CRNA, Anesthesiologist and Surgeon  Anesthesia Plan Comments: (PAT note written 11/11/2019 by Myra Gianotti, PA-C. SAME DAY WORK-UP   )       Anesthesia Quick Evaluation

## 2019-11-11 NOTE — Progress Notes (Signed)
Richard Vincent denies chest pain or discomfort.  Patient tested negative for Covid on 11/09/2019 and has been in quarantine with his wife , he did have a PET scan today, masking and distancing was observed.

## 2019-11-12 ENCOUNTER — Other Ambulatory Visit: Payer: Self-pay

## 2019-11-12 ENCOUNTER — Ambulatory Visit (HOSPITAL_COMMUNITY): Payer: Medicare PPO | Admitting: Vascular Surgery

## 2019-11-12 ENCOUNTER — Encounter (HOSPITAL_COMMUNITY): Payer: Self-pay | Admitting: Pulmonary Disease

## 2019-11-12 ENCOUNTER — Ambulatory Visit (HOSPITAL_COMMUNITY)
Admission: RE | Admit: 2019-11-12 | Discharge: 2019-11-12 | Disposition: A | Discharge status: Home / Self Care | Payer: Medicare PPO | Attending: Pulmonary Disease | Admitting: Pulmonary Disease

## 2019-11-12 ENCOUNTER — Ambulatory Visit (HOSPITAL_COMMUNITY): Payer: Medicare PPO

## 2019-11-12 ENCOUNTER — Encounter (HOSPITAL_COMMUNITY)
Admission: RE | Disposition: A | Discharge status: Home / Self Care | Payer: Self-pay | Source: Home / Self Care | Attending: Pulmonary Disease

## 2019-11-12 DIAGNOSIS — J90 Pleural effusion, not elsewhere classified: Secondary | ICD-10-CM | POA: Diagnosis not present

## 2019-11-12 DIAGNOSIS — I700 Atherosclerosis of aorta: Secondary | ICD-10-CM | POA: Diagnosis not present

## 2019-11-12 DIAGNOSIS — I2510 Atherosclerotic heart disease of native coronary artery without angina pectoris: Secondary | ICD-10-CM | POA: Insufficient documentation

## 2019-11-12 DIAGNOSIS — I10 Essential (primary) hypertension: Secondary | ICD-10-CM | POA: Diagnosis not present

## 2019-11-12 DIAGNOSIS — J432 Centrilobular emphysema: Secondary | ICD-10-CM | POA: Insufficient documentation

## 2019-11-12 DIAGNOSIS — Z87891 Personal history of nicotine dependence: Secondary | ICD-10-CM | POA: Diagnosis not present

## 2019-11-12 DIAGNOSIS — C3412 Malignant neoplasm of upper lobe, left bronchus or lung: Secondary | ICD-10-CM | POA: Diagnosis not present

## 2019-11-12 DIAGNOSIS — Z9889 Other specified postprocedural states: Secondary | ICD-10-CM

## 2019-11-12 DIAGNOSIS — Z91040 Latex allergy status: Secondary | ICD-10-CM | POA: Insufficient documentation

## 2019-11-12 DIAGNOSIS — I480 Paroxysmal atrial fibrillation: Secondary | ICD-10-CM | POA: Insufficient documentation

## 2019-11-12 DIAGNOSIS — R918 Other nonspecific abnormal finding of lung field: Secondary | ICD-10-CM | POA: Diagnosis present

## 2019-11-12 DIAGNOSIS — Z8709 Personal history of other diseases of the respiratory system: Secondary | ICD-10-CM | POA: Diagnosis not present

## 2019-11-12 DIAGNOSIS — Z79899 Other long term (current) drug therapy: Secondary | ICD-10-CM | POA: Insufficient documentation

## 2019-11-12 HISTORY — PX: ENDOBRONCHIAL ULTRASOUND: SHX5096

## 2019-11-12 HISTORY — PX: BRONCHIAL BIOPSY: SHX5109

## 2019-11-12 HISTORY — PX: FINE NEEDLE ASPIRATION: SHX5430

## 2019-11-12 HISTORY — PX: BRONCHIAL WASHINGS: SHX5105

## 2019-11-12 HISTORY — PX: VIDEO BRONCHOSCOPY WITH ENDOBRONCHIAL NAVIGATION: SHX6175

## 2019-11-12 HISTORY — PX: BRONCHIAL BRUSHINGS: SHX5108

## 2019-11-12 HISTORY — PX: FIDUCIAL MARKER PLACEMENT: SHX6858

## 2019-11-12 LAB — APTT: aPTT: 35 seconds (ref 24–36)

## 2019-11-12 LAB — COMPREHENSIVE METABOLIC PANEL
ALT: 13 U/L (ref 0–44)
AST: 19 U/L (ref 15–41)
Albumin: 3.9 g/dL (ref 3.5–5.0)
Alkaline Phosphatase: 65 U/L (ref 38–126)
Anion gap: 8 (ref 5–15)
BUN: 26 mg/dL — ABNORMAL HIGH (ref 8–23)
CO2: 24 mmol/L (ref 22–32)
Calcium: 9.3 mg/dL (ref 8.9–10.3)
Chloride: 109 mmol/L (ref 98–111)
Creatinine, Ser: 1.9 mg/dL — ABNORMAL HIGH (ref 0.61–1.24)
GFR calc Af Amer: 36 mL/min — ABNORMAL LOW (ref >60)
GFR calc non Af Amer: 31 mL/min — ABNORMAL LOW (ref >60)
Glucose, Bld: 105 mg/dL — ABNORMAL HIGH (ref 70–99)
Potassium: 5.4 mmol/L — ABNORMAL HIGH (ref 3.5–5.1)
Sodium: 141 mmol/L (ref 135–145)
Total Bilirubin: 1.1 mg/dL (ref 0.3–1.2)
Total Protein: 6.8 g/dL (ref 6.5–8.1)

## 2019-11-12 LAB — CBC
HCT: 45.8 % (ref 39.0–52.0)
Hemoglobin: 14.7 g/dL (ref 13.0–17.0)
MCH: 31.2 pg (ref 26.0–34.0)
MCHC: 32.1 g/dL (ref 30.0–36.0)
MCV: 97.2 fL (ref 80.0–100.0)
Platelets: 227 10*3/uL (ref 150–400)
RBC: 4.71 MIL/uL (ref 4.22–5.81)
RDW: 12.5 % (ref 11.5–15.5)
WBC: 6.1 10*3/uL (ref 4.0–10.5)
nRBC: 0 % (ref 0.0–0.2)

## 2019-11-12 LAB — PROTIME-INR
INR: 1.1 (ref 0.8–1.2)
Prothrombin Time: 13.7 seconds (ref 11.4–15.2)

## 2019-11-12 SURGERY — VIDEO BRONCHOSCOPY WITH ENDOBRONCHIAL NAVIGATION
Anesthesia: General

## 2019-11-12 MED ORDER — LIDOCAINE 2% (20 MG/ML) 5 ML SYRINGE
INTRAMUSCULAR | Status: DC | PRN
  Administered 2019-11-12: 100 mg via INTRAVENOUS

## 2019-11-12 MED ORDER — DEXAMETHASONE SODIUM PHOSPHATE 10 MG/ML IJ SOLN
INTRAMUSCULAR | Status: DC | PRN
  Administered 2019-11-12: 10 mg via INTRAVENOUS

## 2019-11-12 MED ORDER — LACTATED RINGERS IV SOLN: INTRAVENOUS | Status: DC | PRN

## 2019-11-12 MED ORDER — ROCURONIUM BROMIDE 50 MG/5ML IV SOSY
PREFILLED_SYRINGE | INTRAVENOUS | Status: DC | PRN
  Administered 2019-11-12: 60 mg via INTRAVENOUS

## 2019-11-12 MED ORDER — LACTATED RINGERS IV SOLN: INTRAVENOUS | Status: DC

## 2019-11-12 MED ORDER — EPHEDRINE SULFATE-NACL 50-0.9 MG/10ML-% IV SOSY
PREFILLED_SYRINGE | INTRAVENOUS | Status: DC | PRN
  Administered 2019-11-12: 10 mg via INTRAVENOUS

## 2019-11-12 MED ORDER — PHENYLEPHRINE HCL-NACL 10-0.9 MG/250ML-% IV SOLN
INTRAVENOUS | Status: DC | PRN
  Administered 2019-11-12: 50 ug/min via INTRAVENOUS

## 2019-11-12 MED ORDER — SUGAMMADEX SODIUM 200 MG/2ML IV SOLN
INTRAVENOUS | Status: DC | PRN
  Administered 2019-11-12: 200 mg via INTRAVENOUS

## 2019-11-12 MED ORDER — FENTANYL CITRATE (PF) 100 MCG/2ML IJ SOLN
INTRAMUSCULAR | Status: DC | PRN
  Administered 2019-11-12: 100 ug via INTRAVENOUS

## 2019-11-12 MED ORDER — FENTANYL CITRATE (PF) 100 MCG/2ML IJ SOLN: 25.0000 ug | INTRAMUSCULAR | Status: DC | PRN

## 2019-11-12 MED ORDER — ONDANSETRON HCL 4 MG/2ML IJ SOLN
INTRAMUSCULAR | Status: DC | PRN
  Administered 2019-11-12: 4 mg via INTRAVENOUS

## 2019-11-12 MED ORDER — PROPOFOL 10 MG/ML IV BOLUS
INTRAVENOUS | Status: DC | PRN
  Administered 2019-11-12: 170 mg via INTRAVENOUS

## 2019-11-12 SURGICAL SUPPLY — 47 items
ADAPTER BRONCH F/PENTAX (ADAPTER) ×4 IMPLANT
ADAPTER VALVE BIOPSY EBUS (MISCELLANEOUS) IMPLANT
ADPR BSCP EDG PNTX (ADAPTER) ×2
ADPTR VALVE BIOPSY EBUS (MISCELLANEOUS)
BRUSH CYTOL CELLEBRITY 1.5X140 (MISCELLANEOUS) ×4 IMPLANT
BRUSH SUPERTRAX BIOPSY (INSTRUMENTS) IMPLANT
BRUSH SUPERTRAX NDL-TIP CYTO (INSTRUMENTS) ×4 IMPLANT
CANISTER SUCT 3000ML PPV (MISCELLANEOUS) ×4 IMPLANT
CHANNEL WORK EXTEND EDGE 180 (KITS) IMPLANT
CHANNEL WORK EXTEND EDGE 45 (KITS) IMPLANT
CHANNEL WORK EXTEND EDGE 90 (KITS) IMPLANT
CONT SPEC 4OZ CLIKSEAL STRL BL (MISCELLANEOUS) ×4 IMPLANT
COVER BACK TABLE 60X90IN (DRAPES) ×4 IMPLANT
FILTER STRAW FLUID ASPIR (MISCELLANEOUS) IMPLANT
FORCEPS BIOP SUPERTRX PREMAR (INSTRUMENTS) ×4 IMPLANT
GAUZE SPONGE 4X4 12PLY STRL (GAUZE/BANDAGES/DRESSINGS) ×4 IMPLANT
GLOVE SURG SS PI 7.5 STRL IVOR (GLOVE) ×8 IMPLANT
GOWN STRL REUS W/ TWL LRG LVL3 (GOWN DISPOSABLE) ×4 IMPLANT
GOWN STRL REUS W/TWL LRG LVL3 (GOWN DISPOSABLE) ×8
KIT CLEAN ENDO COMPLIANCE (KITS) ×4 IMPLANT
KIT LOCATABLE GUIDE (CANNULA) IMPLANT
KIT MARKER FIDUCIAL DELIVERY (KITS) IMPLANT
KIT PROCEDURE EDGE 180 (KITS) IMPLANT
KIT PROCEDURE EDGE 45 (KITS) IMPLANT
KIT PROCEDURE EDGE 90 (KITS) IMPLANT
KIT TURNOVER KIT B (KITS) ×4 IMPLANT
MARKER SKIN DUAL TIP RULER LAB (MISCELLANEOUS) ×4 IMPLANT
NDL SUPERTRX PREMARK BIOPSY (NEEDLE) ×2 IMPLANT
NEEDLE SUPERTRX PREMARK BIOPSY (NEEDLE) ×4 IMPLANT
NS IRRIG 1000ML POUR BTL (IV SOLUTION) ×4 IMPLANT
OIL SILICONE PENTAX (PARTS (SERVICE/REPAIRS)) ×4 IMPLANT
PAD ARMBOARD 7.5X6 YLW CONV (MISCELLANEOUS) ×8 IMPLANT
PATCHES PATIENT (LABEL) ×12 IMPLANT
SOL ANTI FOG 6CC (MISCELLANEOUS) ×2 IMPLANT
SOLUTION ANTI FOG 6CC (MISCELLANEOUS) ×2
SYR 20CC LL (SYRINGE) ×4 IMPLANT
SYR 20ML ECCENTRIC (SYRINGE) ×4 IMPLANT
SYR 50ML SLIP (SYRINGE) ×4 IMPLANT
TOWEL OR 17X24 6PK STRL BLUE (TOWEL DISPOSABLE) ×4 IMPLANT
TRAP SPECIMEN MUCOUS 40CC (MISCELLANEOUS) IMPLANT
TUBE CONNECTING 20'X1/4 (TUBING) ×1
TUBE CONNECTING 20X1/4 (TUBING) ×3 IMPLANT
UNDERPAD 30X30 (UNDERPADS AND DIAPERS) ×4 IMPLANT
VALVE BIOPSY  SINGLE USE (MISCELLANEOUS) ×4
VALVE BIOPSY SINGLE USE (MISCELLANEOUS) ×2 IMPLANT
VALVE SUCTION BRONCHIO DISP (MISCELLANEOUS) ×4 IMPLANT
WATER STERILE IRR 1000ML POUR (IV SOLUTION) ×4 IMPLANT

## 2019-11-12 NOTE — Anesthesia Procedure Notes (Signed)
Procedure Name: Intubation Date/Time: 11/12/2019 1:11 PM Performed by: Lance Coon, CRNA Pre-anesthesia Checklist: Patient identified, Emergency Drugs available, Suction available, Patient being monitored and Timeout performed Patient Re-evaluated:Patient Re-evaluated prior to induction Oxygen Delivery Method: Circle system utilized Preoxygenation: Pre-oxygenation with 100% oxygen Induction Type: IV induction Ventilation: Mask ventilation without difficulty Laryngoscope Size: Miller and 3 Grade View: Grade I Tube type: Oral Tube size: 8.5 mm Number of attempts: 1 Airway Equipment and Method: Stylet Placement Confirmation: ETT inserted through vocal cords under direct vision,  positive ETCO2 and breath sounds checked- equal and bilateral Secured at: 22 cm Tube secured with: Tape Dental Injury: Teeth and Oropharynx as per pre-operative assessment

## 2019-11-12 NOTE — Discharge Instructions (Signed)
Flexible Bronchoscopy, Care After This sheet gives you information about how to care for yourself after your test. Your doctor may also give you more specific instructions. If you have problems or questions, contact your doctor. Follow these instructions at home: Eating and drinking  The day after the test, go back to your normal diet. Driving  Do not drive for 24 hours if you were given a medicine to help you relax (sedative).  Do not drive or use heavy machinery while taking prescription pain medicine. General instructions   Take over-the-counter and prescription medicines only as told by your doctor.  Return to your normal activities as told. Ask what activities are safe for you.  Do not use any products that have nicotine or tobacco in them. This includes cigarettes and e-cigarettes. If you need help quitting, ask your doctor.  Keep all follow-up visits as told by your doctor. This is important. It is very important if you had a tissue sample (biopsy) taken. Get help right away if:  You have shortness of breath that gets worse.  You get light-headed.  You feel like you are going to pass out (faint).  You have chest pain.  You cough up: ? More than a little blood. ? More blood than before. Summary  Do not eat or drink anything (not even water) for 2 hours after your test, or until your numbing medicine wears off.  Do not use cigarettes. Do not use e-cigarettes.  Get help right away if you have chest pain. This information is not intended to replace advice given to you by your health care provider. Make sure you discuss any questions you have with your health care provider. Document Revised: 06/16/2017 Document Reviewed: 07/22/2016 Elsevier Patient Education  2020 [employer].

## 2019-11-12 NOTE — Interval H&P Note (Signed)
History and Physical Interval Note:  11/12/2019 10:12 AM  Richard Vincent.  has presented today for surgery, with the diagnosis of LEFT LUNG MASS.  The various methods of treatment have been discussed with the patient and family. After consideration of risks, benefits and other options for treatment, the patient has consented to  Procedure(s): [place] (N/A) as a surgical intervention.  The patient's history has been reviewed, patient examined, no change in status, stable for surgery.  I have reviewed the patient's chart and labs.  Questions were answered to the patient's satisfaction.    Patient seen in pre-op. All questions answered. Stopped eliquis on Saturday. Discussed risks of bleeding and pneumothorax. Patient is agreeable to proceed.   [place]

## 2019-11-12 NOTE — Transfer of Care (Signed)
Immediate Anesthesia Transfer of Care Note  Patient: Richard Vincent.  Procedure(s) Performed: VIDEO BRONCHOSCOPY WITH ENDOBRONCHIAL NAVIGATION (N/A ) ENDOBRONCHIAL ULTRASOUND BRONCHIAL BRUSHINGS FINE NEEDLE ASPIRATION (FNA) LINEAR LUNG BIOPSY FIDUCIAL MARKER PLACEMENT BRONCHIAL WASHINGS  Patient Location: Endoscopy Unit  Anesthesia Type:General  Level of Consciousness: drowsy and patient cooperative  Airway & Oxygen Therapy: Patient Spontanous Breathing  Post-op Assessment: Report given to RN and Post -op Vital signs reviewed and stable  Post vital signs: Reviewed and stable  Last Vitals:  Vitals Value Taken Time  BP 164/65 11/12/19 1424  Temp    Pulse 72 11/12/19 1426  Resp 16 11/12/19 1426  SpO2 95 % 11/12/19 1426  Vitals shown include unvalidated device data.  Last Pain:  Vitals:   11/12/19 0930  TempSrc:   PainSc: 0-No pain         Complications: No apparent anesthesia complications

## 2019-11-12 NOTE — Progress Notes (Signed)
K+ 5.4, Dr. Nyoka Cowden notified. No orders given.

## 2019-11-12 NOTE — Anesthesia Postprocedure Evaluation (Signed)
Anesthesia Post Note  Patient: Richard Vincent.  Procedure(s) Performed: VIDEO BRONCHOSCOPY WITH ENDOBRONCHIAL NAVIGATION (N/A ) ENDOBRONCHIAL ULTRASOUND BRONCHIAL BRUSHINGS FINE NEEDLE ASPIRATION (FNA) LINEAR FIDUCIAL MARKER PLACEMENT BRONCHIAL WASHINGS BRONCHIAL BIOPSIES     Patient location during evaluation: PACU Anesthesia Type: General Level of consciousness: awake Pain management: pain level controlled Vital Signs Assessment: post-procedure vital signs reviewed and stable Cardiovascular status: blood pressure returned to baseline Postop Assessment: no headache Anesthetic complications: no    Last Vitals:  Vitals:   11/12/19 1435 11/12/19 1450  BP: 136/65 135/65  Pulse: 66 63  Resp: (!) 23 16  Temp:    SpO2: 92% 94%    Last Pain:  Vitals:   11/12/19 1450  TempSrc:   PainSc: 0-No pain                 Emmalena Canny

## 2019-11-12 NOTE — Op Note (Signed)
Video Bronchoscopy with Electromagnetic Navigation and endobronchial ultrasound procedure Note  Date of Operation: 11/12/2019  Pre-op Diagnosis: Lung mass, mediastinal adenopathy  Post-op Diagnosis: Lung mass mediastinal adenopathy  Surgeon: [name], DO   Assistants: None   Anesthesia: General endotracheal anesthesia  Operation: Flexible video fiberoptic bronchoscopy with electromagnetic navigation and biopsies.  Estimated Blood Loss: Minimal, <3MH  Complications: None immediate   Indications and History: Richard Vincent. is a 86 y.o. male with left upper lobe lung mass, small mediastinal node noted on pet imaging.  The risks, benefits, complications, treatment options and expected outcomes were discussed with the patient.  The possibilities of pneumothorax, pneumonia, reaction to medication, pulmonary aspiration, perforation of a viscus, bleeding, failure to diagnose a condition and creating a complication requiring transfusion or operation were discussed with the patient who freely signed the consent.    Description of Procedure: The patient was seen in the Preoperative Area, was examined and was deemed appropriate to proceed.  The patient was taken to [facility] endoscopy room 2, identified as Richard Vincent. and the procedure verified as Flexible Video Fiberoptic Bronchoscopy.  A Time Out was held and the above information confirmed.   Prior to the date of the procedure a high-resolution CT scan of the chest was performed. Utilizing Chicken a virtual tracheobronchial tree was generated to allow the creation of distinct navigation pathways to the patient's parenchymal abnormalities. After being taken to the operating room general anesthesia was initiated and the patient  was orally intubated. The video fiberoptic bronchoscope was introduced via the endotracheal tube and a general inspection was performed which showed normal right and left lung anatomy with no  evidence of endobronchial lesion.  Prior to the start of the navigational procedure we completed a right mainstem anterior wall posterior wall brushing for bronchial epithelial genetic testing and tumor marker risk stratification via veracyte.   The extendable working channel and locator guide were introduced into the bronchoscope.  A full fluoroscopic sweep with inspiratory breath-hold and a PL of 25 was completed for local registration.  The distinct navigation pathways prepared prior to this procedure were then utilized to navigate to within 0.8 cm of patient's lesion(s) identified on CT scan. The extendable working channel was secured into place and the locator guide was withdrawn. Under fluoroscopic guidance transbronchial needle brushings, transbronchial Wang needle biopsies, and transbronchial forceps biopsies were performed to be sent for cytology and pathology.  Following sampling of tissue 3 gold fiducials were placed with an axial plane of the target lesion under fluoroscopic guidance.  Each fiducial placed at approximately 3 cm from lesion. A bronchioalveolar lavage was performed in the left upper lobe and sent for cytology. At the end of the procedure a general airway inspection was performed and there was no evidence of active bleeding.  The standard bronchoscope was switched for the Olympus endobronchial ultrasound.  Endobronchial ultrasound was completed within the hilum as well as station 7.  Connective tissue was visualized with no obvious nodal structure within the central portion of the mediastinum.  The left lower 8 mm node was attempted to be located as seen on pet imaging.  There was a small nodal structure visible with endobronchial ultrasound however its cross-section and ability to access with transbronchial needle aspirations would be difficult if not impossible to collect any adequate tissue.  Decision was made for no needle specimens from this location the bronchoscope was removed.  We  switched back to the standard viewing  bronchoscope and the airways were therapeutically suctioned clear of any secretions or blood clots remaining. The patient tolerated the procedure well. There was no significant blood loss and there were no obvious complications. A post-procedural chest x-ray is pending.  Samples: 1. Transbronchial needle brushings from left upper lobe 2. Transbronchial Wang needle biopsies from left upper lobe 3. Transbronchial forceps biopsies from left upper lobe 4. Bronchoalveolar lavage from left upper lobe 5. RM brushings X2   Plans:  The patient will be discharged from the PACU to home when recovered from anesthesia and after chest x-ray is reviewed. We will review the cytology, pathology and microbiology results with the patient when they become available. Outpatient followup will be with Richard Vincent Richard Mahika Vanvoorhis,DO.   [name], DO [facility] 11/12/2019 2:29 PM

## 2019-11-13 ENCOUNTER — Telehealth: Payer: Self-pay | Admitting: Cardiology

## 2019-11-13 ENCOUNTER — Telehealth: Payer: Self-pay | Admitting: *Deleted

## 2019-11-13 ENCOUNTER — Telehealth: Payer: Self-pay | Admitting: Pulmonary Disease

## 2019-11-13 LAB — CYTOLOGY - NON PAP

## 2019-11-13 LAB — SURGICAL PATHOLOGY

## 2019-11-13 NOTE — Telephone Encounter (Signed)
Called and spoke with patient in regards to Eliquis and appt with radiation.  Dr. Valeta Harms patient wants to know when he needs to start taking his Eliquis again and he wants to know if it would be ok to reschedule his visit with radiation at [facility] long for after his appointment with you on 11/27/19 at [facility] so that he is better prepared for the appointment with them.  Please advise

## 2019-11-13 NOTE — Telephone Encounter (Signed)
Oncology Nurse Navigator Documentation  Oncology Nurse Navigator Flowsheets 11/13/2019  Navigator Location [facility]  Referral Date to RadOnc/MedOnc 11/13/2019  Navigator Encounter Type Telephone/I received referral on Richard Vincent.  He need to come to [facility] and see Rad Onc. I called patient.  I gave him an appt to be seen on 5/6 but he wanted to wait until after the 12th.  I gave him an appt for 11/28/19.  He verbalized understanding of appt time and place.   Telephone Outgoing Call  Treatment Phase Pre-Tx/Tx Discussion  Barriers/Navigation Needs Coordination of Care;Education  Education Other  Interventions Coordination of Care;Education  Acuity Level 2-Minimal Needs (1-2 Barriers Identified)  Coordination of Care Appts;Other  Education Method Verbal  Time Spent with Patient 30

## 2019-11-13 NOTE — Telephone Encounter (Signed)
Spoke with patient's wife Pamala Hurry (she is listed on his DPR). She verbalized understanding. She stated that she will have him to call oncology to proceed with the appointment.   Nothing further needed at time of call.

## 2019-11-13 NOTE — Telephone Encounter (Signed)
Spoke with pt, okay given for patient to restart eliquis.

## 2019-11-13 NOTE — Telephone Encounter (Signed)
New Message     Pt c/o medication issue:  1. Name of Medication: ELIQUIS 2.5 MG TABS tablet  2. How are you currently taking this medication (dosage and times per day)? Not currently taking   3. Are you having a reaction (difficulty breathing--STAT)? No   4. What is your medication issue? Pt says this past weekend he had a surgery to remove a possible cancer issue. He was advised to stop taking Eliquis. He called the Dr who did the procedure and was told he could start back taking the Eliquis today. Pt wants to make sure this is okay with Dr Stanford Breed     Please call

## 2019-11-13 NOTE — Telephone Encounter (Signed)
PCCM:  He can restart his eliquis. He should keep his appt with radiation oncology. I will have his final pathology hopefully by Friday afternoon. I will call him with the results.   [name], DO [facility] 11/13/2019 4:08 PM

## 2019-11-14 NOTE — Progress Notes (Signed)
FYI. I called the patient he knows the path results.  Thanks [employer]

## 2019-11-20 NOTE — Progress Notes (Signed)
Thoracic Location of Tumor / Histology:  LEFT lung mass  Patient presented 1 month ago with symptoms of: chills and fever and cxr revealed nodule.  Biopsies revealed:  11/12/2019 FINAL MICROSCOPIC DIAGNOSIS:  A. LUNG, LEFT UPPER LOBE, BIOPSY:  - Lung tissue; no malignancy identified.   Tobacco/Marijuana/Snuff/ETOH use: Former smoker--quit in 1996  Past/Anticipated interventions by cardiothoracic surgery, if any:  11/12/2019 Dr. [name] Icard Flexible video fiberoptic bronchoscopy with electromagnetic navigation and biopsies  Past/Anticipated interventions by medical oncology, if any: No  Signs/Symptoms  Weight changes, if any: lost 2 lbs unintentional  Respiratory complaints, if any: No  Hemoptysis, if any:  No Pain issues, if any:  NO SAFETY ISSUES:  Prior radiation? No  Pacemaker/ICD? No  Possible current pregnancy male  Is the patient on methotrexate?\  Vitals:   11/21/19 1425  BP: 130/70  Pulse: 67  Resp: 20  Temp: 98.2 F (36.8 C)  SpO2: 100%  Weight: 192 lb 3.2 oz (87.2 kg)  Height: 6\' 1"  (1.854 m)    Wt Readings from Last 3 Encounters:  11/21/19 192 lb 3.2 oz (87.2 kg)  11/12/19 200 lb (90.7 kg)  10/31/19 199 lb (90.3 kg)

## 2019-11-21 ENCOUNTER — Encounter: Payer: Self-pay | Admitting: Radiation Oncology

## 2019-11-21 ENCOUNTER — Ambulatory Visit
Admission: RE | Admit: 2019-11-21 | Discharge: 2019-11-21 | Disposition: A | Discharge status: Home / Self Care | Payer: Medicare PPO | Source: Ambulatory Visit | Attending: Radiation Oncology | Admitting: Radiation Oncology

## 2019-11-21 ENCOUNTER — Other Ambulatory Visit: Payer: Self-pay

## 2019-11-21 DIAGNOSIS — K769 Liver disease, unspecified: Secondary | ICD-10-CM | POA: Diagnosis not present

## 2019-11-21 DIAGNOSIS — Z79899 Other long term (current) drug therapy: Secondary | ICD-10-CM | POA: Insufficient documentation

## 2019-11-21 DIAGNOSIS — C3412 Malignant neoplasm of upper lobe, left bronchus or lung: Secondary | ICD-10-CM | POA: Insufficient documentation

## 2019-11-21 DIAGNOSIS — I700 Atherosclerosis of aorta: Secondary | ICD-10-CM | POA: Insufficient documentation

## 2019-11-21 DIAGNOSIS — K8020 Calculus of gallbladder without cholecystitis without obstruction: Secondary | ICD-10-CM | POA: Insufficient documentation

## 2019-11-21 DIAGNOSIS — N400 Enlarged prostate without lower urinary tract symptoms: Secondary | ICD-10-CM | POA: Insufficient documentation

## 2019-11-21 DIAGNOSIS — J45909 Unspecified asthma, uncomplicated: Secondary | ICD-10-CM | POA: Diagnosis not present

## 2019-11-21 DIAGNOSIS — M129 Arthropathy, unspecified: Secondary | ICD-10-CM | POA: Diagnosis not present

## 2019-11-21 DIAGNOSIS — N281 Cyst of kidney, acquired: Secondary | ICD-10-CM | POA: Diagnosis not present

## 2019-11-21 DIAGNOSIS — J439 Emphysema, unspecified: Secondary | ICD-10-CM | POA: Diagnosis not present

## 2019-11-21 DIAGNOSIS — Z85828 Personal history of other malignant neoplasm of skin: Secondary | ICD-10-CM | POA: Insufficient documentation

## 2019-11-21 DIAGNOSIS — K219 Gastro-esophageal reflux disease without esophagitis: Secondary | ICD-10-CM | POA: Diagnosis not present

## 2019-11-21 DIAGNOSIS — Z7901 Long term (current) use of anticoagulants: Secondary | ICD-10-CM | POA: Insufficient documentation

## 2019-11-21 DIAGNOSIS — R918 Other nonspecific abnormal finding of lung field: Secondary | ICD-10-CM | POA: Diagnosis present

## 2019-11-21 DIAGNOSIS — I10 Essential (primary) hypertension: Secondary | ICD-10-CM | POA: Diagnosis not present

## 2019-11-21 DIAGNOSIS — I2510 Atherosclerotic heart disease of native coronary artery without angina pectoris: Secondary | ICD-10-CM | POA: Insufficient documentation

## 2019-11-21 DIAGNOSIS — K449 Diaphragmatic hernia without obstruction or gangrene: Secondary | ICD-10-CM | POA: Diagnosis not present

## 2019-11-21 DIAGNOSIS — I480 Paroxysmal atrial fibrillation: Secondary | ICD-10-CM | POA: Insufficient documentation

## 2019-11-21 DIAGNOSIS — Z87891 Personal history of nicotine dependence: Secondary | ICD-10-CM | POA: Insufficient documentation

## 2019-11-21 NOTE — Progress Notes (Signed)
Radiation Oncology         [phone]) [phone] ________________________________  Initial Outpatient Consultation  Name: Richard Vincent. MRN: [id number]  Date: 11/21/2019  DOB: [birth year]  SH:[id number], No Pcp Per  [name], DO   REFERRING PHYSICIAN: [name], DO  DIAGNOSIS: The encounter diagnosis was Primary cancer of left upper lobe of lung ([place]).  Stage IA3 (T1c,N0, M0) vs. stage IIIA (T1c,N2, M0) squamous cell carcinoma of the left upper lobe   HISTORY OF PRESENT ILLNESS::Richard Vincent. is a 86 y.o. male who is seen out of the courtesy of Dr. Valeta Harms for consideration of radiation therapy as part of the management of the patient's  recently diagnosed non-small cell lung cancer. The patient presented to the ED on 09/14/2019 with chief complaint of fevers. Chest x-ray at that time showed a faint 2 cm nodular density in the left mid lung field. Follow-up CT of chest on 10/23/2019 showed a spiculated solid 3.0 cm anterior left upper lobe lung mass that was suspicious for primary bronchogenic carcinoma. There was no thoracic adenopathy. There were also two tiny solid peripheral right lower lobe pulmonary nodules, the largest being 3 mm.   The patient was referred to Dr. Valeta Harms and was seen in consultation on 10/31/2019. At that time, it was recommended that the patient proceed with an invasive tissue diagnosis including bronchoscopy with endobronchial navigation and biopsy followed by SBRT.  CT of chest on 11/06/2019 showed a similar macro-lobulated and spiculated left upper lobe mass that measured 2.2 x 3.3 x 1.7 cm. The 3 mm subpleural nodules were stable, nonspecific, and likely benign. There was also some diffuse bronchial wall thickening with mild centrilobular and paraseptal emphysema suggestive of underlying COPD. Finally, there was some aortic atherosclerosis and some calcifications of the aortic valve.  PET scan on 11/11/2019 showed a hypermetabolic lingular nodule with  mildly hypermetabolic ipsilateral mediastinal/hilar lymph nodes, indicative of primary bronchogenic carcinoma.  The patient underwent a flexible video fiberoptic bronchoscopy with electromagnetic navigation and biopsies on 11/12/2019 performed by Dr. Valeta Harms. Pathology from the procedure revealed no malignancy of the left upper lobe lung tissue biopsy. Left upper lobe brushings were suspicious for malignancy and left upper lobe fine needle aspiration showed the presence of malignant cells favoring squamous cell carcinoma.  Patient also had placement of fiducial markers for anticipated stereotactic body radiation therapy.   PREVIOUS RADIATION THERAPY: No  PAST MEDICAL HISTORY:  Past Medical History:  Diagnosis Date  . Asthma    childhood   . Basal cell cancer    nose  . BPH (benign prostatic hyperplasia)   . GERD (gastroesophageal reflux disease)    occ  . Gout    changed diet years ago ; no gout attacks since   . Hypertension   . Paroxysmal A-fib ([facility])    occurred post-operatively ; last EKG he was in NSR   . Renal insufficiency    non problematic     PAST SURGICAL HISTORY: Past Surgical History:  Procedure Laterality Date  . BRONCHIAL BIOPSY  11/12/2019   Procedure: BRONCHIAL BIOPSIES;  Surgeon: [name], DO;  Location: [facility];  Service: Pulmonary;;  . BRONCHIAL BRUSHINGS  11/12/2019   Procedure: BRONCHIAL BRUSHINGS;  Surgeon: [name], DO;  Location: [facility];  Service: Pulmonary;;  . BRONCHIAL WASHINGS  11/12/2019   Procedure: BRONCHIAL WASHINGS;  Surgeon: [name], DO;  Location: [facility];  Service: Pulmonary;;  . ENDOBRONCHIAL ULTRASOUND  11/12/2019  Procedure: ENDOBRONCHIAL ULTRASOUND;  Surgeon: [name], DO;  Location: [facility];  Service: Pulmonary;;  . EYE SURGERY  about 5-6 years ago    cataracts bilateral   . FIDUCIAL MARKER PLACEMENT  11/12/2019   Procedure: FIDUCIAL MARKER PLACEMENT;  Surgeon: [name], DO;   Location: [facility];  Service: Pulmonary;;  . FINE NEEDLE ASPIRATION  11/12/2019   Procedure: FINE NEEDLE ASPIRATION (FNA) LINEAR;  Surgeon: [name], DO;  Location: [facility];  Service: Pulmonary;;  . NOSE SURGERY    . Skin cancer removed    . TONSILLECTOMY    . TRANSURETHRAL RESECTION OF PROSTATE    . VIDEO BRONCHOSCOPY WITH ENDOBRONCHIAL NAVIGATION N/A 11/12/2019   Procedure: VIDEO BRONCHOSCOPY WITH ENDOBRONCHIAL NAVIGATION;  Surgeon: [name], DO;  Location: [facility];  Service: Pulmonary;  Laterality: N/A;  . XI ROBOTIC ASSISTED SIMPLE PROSTATECTOMY N/A 12/14/2017   Procedure: XI ROBOTIC ASSISTED SIMPLE PROSTATECTOMY;  Surgeon: Cleon Gustin, MD;  Location: [facility];  Service: Urology;  Laterality: N/A;    FAMILY HISTORY:  Family History  Problem Relation Age of Onset  . Coronary artery disease Other        No family history    SOCIAL HISTORY:  Social History   Tobacco Use  . Smoking status: Former Smoker    Packs/day: 1.00    Years: 30.00    Pack years: 30.00    Types: Cigarettes    Quit date: 10/17/1990    Years since quitting: 29.1  . Smokeless tobacco: Never Used  Substance Use Topics  . Alcohol use: Yes    Alcohol/week: 14.0 standard drinks    Types: 14 Glasses of wine per week    Comment: Occasional  . Drug use: No    ALLERGIES:  Allergies  Allergen Reactions  . Latex Rash    Mild rash    MEDICATIONS:  Current Outpatient Medications  Medication Sig Dispense Refill  . amLODipine (NORVASC) 10 MG tablet TAKE ONE TABLET BY MOUTH DAILY (Patient taking differently: Take 10 mg by mouth daily. ) 90 tablet 3  . ascorbic acid (VITAMIN C) 500 MG tablet Take 500 mg by mouth every evening.    . cholecalciferol (VITAMIN D3) 25 MCG (1000 UNIT) tablet Take 1,000 Units by mouth in the morning and at bedtime.    Marland Kitchen ELIQUIS 2.5 MG TABS tablet TAKE ONE TABLET BY MOUTH TWICE A DAY (Patient taking differently: Take 2.5 mg by mouth 2 (two) times daily.  ) 60 tablet 2  . Investigational - Study Medication Take 1 tablet by mouth daily. Preventable study drug: Atorvastatin 40 mg or placebo    . magnesium hydroxide (MILK OF MAGNESIA) 400 MG/5ML suspension Take 15 mLs by mouth daily as needed for mild constipation.    . metoprolol succinate (TOPROL-XL) 50 MG 24 hr tablet TAKE ONE TABLET BY MOUTH DAILY; OFFICE VISIT NEEDED FOR MORE REFILLS (Patient taking differently: Take 50 mg by mouth daily. ) 90 tablet 3  . Psyllium (METAMUCIL PO) Take 1 capsule by mouth every evening. Take 1 tablespoonful with water every evening      No current facility-administered medications for this encounter.    REVIEW OF SYSTEMS:  REVIEW OF SYSTEMS: A 10+ POINT REVIEW OF SYSTEMS WAS OBTAINED including neurology, dermatology, psychiatry, cardiac, respiratory, lymph, extremities, GI, GU, musculoskeletal, constitutional, reproductive, HEENT. All pertinent positives are noted in the HPI. All others are negative.  Prior to diagnosis patient denied any appetite changes, weight loss, change in  performance status or hemoptysis.  Patient is quite functional   PHYSICAL EXAM:  height is 6\' 1"  (1.854 m) and weight is 192 lb 3.2 oz (87.2 kg). His temperature is 98.2 F (36.8 C). His blood pressure is 130/70 and his pulse is 67. His respiration is 20 and oxygen saturation is 100%.   General: Alert and oriented, in no acute distress HEENT: Head is normocephalic. Extraocular movements are intact.  Neck: Neck is supple, no palpable cervical or supraclavicular lymphadenopathy. Heart: Regular in rate and rhythm with no murmurs, rubs, or gallops. Chest: Clear to auscultation bilaterally, with no rhonchi, wheezes, or rales. Abdomen: Soft, nontender, nondistended, with no rigidity or guarding. Extremities: No cyanosis or edema. Lymphatics: see Neck Exam Skin: No concerning lesions. Musculoskeletal: symmetric strength and muscle tone throughout. Neurologic: Cranial nerves II through XII are  grossly intact. No obvious focalities. Speech is fluent. Coordination is intact. Psychiatric: Judgment and insight are intact. Affect is appropriate.   ECOG = 0  0 - Asymptomatic (Fully active, able to carry on all predisease activities without restriction)  1 - Symptomatic but completely ambulatory (Restricted in physically strenuous activity but ambulatory and able to carry out work of a light or sedentary nature. For example, light housework, office work)  2 - Symptomatic, <50% in bed during the day (Ambulatory and capable of all self care but unable to carry out any work activities. Up and about more than 50% of waking hours)  3 - Symptomatic, >50% in bed, but not bedbound (Capable of only limited self-care, confined to bed or chair 50% or more of waking hours)  4 - Bedbound (Completely disabled. Cannot carry on any self-care. Totally confined to bed or chair)  5 - Death   Eustace Pen MM, Creech RH, Tormey DC, et al. [phone]). "Toxicity and response criteria of the [facility] [employer]". [place] Oncol. 5 (6): 649-55  LABORATORY DATA:  Lab Results  Component Value Date   WBC 6.1 11/12/2019   HGB 14.7 11/12/2019   HCT 45.8 11/12/2019   MCV 97.2 11/12/2019   PLT 227 11/12/2019   Lab Results  Component Value Date   NA 141 11/12/2019   K 5.4 (H) 11/12/2019   CL 109 11/12/2019   CO2 24 11/12/2019   GLUCOSE 105 (H) 11/12/2019   CREATININE 1.90 (H) 11/12/2019   CALCIUM 9.3 11/12/2019      RADIOGRAPHY: CT Chest Wo Contrast  Result Date: 10/23/2019 CLINICAL DATA:  Questionable left mid lung nodule on recent chest radiograph. Nonsmoker. EXAM: CT CHEST WITHOUT CONTRAST TECHNIQUE: Multidetector CT imaging of the chest was performed following the standard protocol without IV contrast. COMPARISON:  09/14/2019 chest radiograph. FINDINGS: Cardiovascular: Normal heart size. No significant pericardial effusion/thickening. Three-vessel coronary atherosclerosis. Atherosclerotic  nonaneurysmal thoracic aorta. Dilated main pulmonary artery (3.6 cm diameter). Mediastinum/Nodes: No discrete thyroid nodules. Unremarkable esophagus. No pathologically enlarged axillary, mediastinal or hilar lymph nodes, noting limited sensitivity for the detection of hilar adenopathy on this noncontrast study. Lungs/Pleura: No pneumothorax. No pleural effusion. Mild centrilobular emphysema. Spiculated solid 3.0 x 2.0 cm anterior left upper lobe lung mass (series 3/image 60). Two tiny peripheral right lower lobe solid pulmonary nodules, largest 3 mm (series 3/image 96). Nonspecific mild patchy subpleural reticulation in the lower lungs. Upper abdomen: Small hiatal hernia. Scattered subcentimeter hypodense left liver lesions are too small to characterize. Cholelithiasis. Severe atrophy of the visualized right kidney. Exophytic simple 2.6 cm posterior upper right renal cyst. Musculoskeletal: No aggressive appearing focal osseous lesions.  Moderate thoracic spondylosis. IMPRESSION: 1. Spiculated solid 3.0 cm anterior left upper lobe lung mass, suspicious for primary bronchogenic carcinoma. Multidisciplinary thoracic oncology consultation and PET-CT recommended for further evaluation. 2. No thoracic adenopathy. 3. Two tiny solid peripheral right lower lobe pulmonary nodules, largest 3 mm, for which follow-up on chest CT is recommended in 3 months. 4. Dilated main pulmonary artery, suggesting pulmonary arterial hypertension. 5. Three-vessel coronary atherosclerosis. 6. Small hiatal hernia. 7. Cholelithiasis. 8. Aortic Atherosclerosis (ICD10-I70.0) and Emphysema (ICD10-J43.9). These results will be called to the ordering clinician or representative by the Radiologist Assistant, and communication documented in the PACS or [employer]. Electronically Signed   By: Ilona Sorrel M.D.   On: 10/23/2019 17:09   NM PET Image Initial (PI) Skull Base To Thigh  Result Date: 11/11/2019 CLINICAL DATA:  Initial treatment  strategy for lung nodule. EXAM: NUCLEAR MEDICINE PET SKULL BASE TO THIGH TECHNIQUE: 9.9 mCi F-18 FDG was injected intravenously. Full-ring PET imaging was performed from the skull base to thigh after the radiotracer. CT data was obtained and used for attenuation correction and anatomic localization. Fasting blood glucose: 89 mg/dl COMPARISON:  CT chest [id number]. FINDINGS: Mediastinal blood pool activity: SUV max 2.9 Liver activity: SUV max NA NECK: Mild hypermetabolism along the roof of the mouth, without a CT correlate. No hypermetabolic lymph nodes. Incidental CT findings: None. CHEST: Left upper lobe nodule measures 1.8 x 2.1 cm (8/37) with an SUV max of 11.3. Subcentimeter short axis low left paratracheal and left hilar lymph nodes show minimal hypermetabolism. Index low left paratracheal node measures 8 mm (4/68) with an SUV max of 3 1. No hypermetabolic contralateral mediastinal or hilar lymph nodes. No hypermetabolic axillary adenopathy Incidental CT findings: Atherosclerotic calcification of the aorta, aortic valve and arteries. Heart are enlarged. No pericardial effusion. ABDOMEN/PELVIS: Vague mild hypermetabolism within both adrenal glands and no definite CT correlates. No abnormal hypermetabolism in the liver, spleen or pancreas. No hypermetabolic lymph nodes. Incidental CT findings: Subcentimeter low-attenuation lesions in the liver are too small to characterize. Stones are seen in the gallbladder. Adrenal glands are unremarkable. Right kidney is atrophic. Kidneys contain low-attenuation lesions measuring up to 2.6 cm on the right which are likely cysts but definitive characterization is limited due to size and/or lack of post-contrast imaging. Spleen, pancreas, stomach and bowel are unremarkable with exception of a tiny hiatal hernia. Atherosclerotic calcification of the aorta. Infrarenal aorta measures 2.9 cm. Bladder wall thickening with a possible TURP defect. SKELETON: No abnormal osseous  hypermetabolism. Incidental CT findings: Degenerative changes in the spine. No worrisome lytic or sclerotic lesions. IMPRESSION: 1. Hypermetabolic lingular nodule with mildly hypermetabolic ipsilateral mediastinal/hilar lymph nodes. Findings are indicative of primary bronchogenic carcinoma (likely T1cN2M0 or stage IIIA disease). 2. Cholelithiasis. 3. Ectatic abdominal aorta at risk for aneurysm development. Recommend followup by ultrasound in 5 years. This recommendation follows ACR consensus guidelines: White Paper of the ACR Incidental Findings Committee II on Vascular Findings. J Am Coll Radiol 2013; 25:053-976. 4.  Aortic atherosclerosis (ICD10-I70.0). Electronically Signed   By: Lorin Picket M.D.   On: 11/11/2019 09:36   DG CHEST PORT 1 VIEW  Result Date: 11/12/2019 CLINICAL DATA:  Status post bronchoscopy EXAM: PORTABLE CHEST 1 VIEW COMPARISON:  September 14, 2019 FINDINGS: No pneumothorax. There is airspace opacity in the left perihilar/upper lobe region with surgical clips in this area. There is a small left pleural effusion. There is fibrosis in the right lung base, stable. Heart size is upper normal, stable. Pulmonary  vascularity is within normal limits. No adenopathy evident. There is aortic atherosclerosis. No bone lesions. IMPRESSION: 1. Airspace opacity, left perihilar/left upper lobe region. New surgical clips in this area. 2.  Stable fibrosis right base. 3.  Small pleural effusion on the left. 4.  Stable cardiac silhouette.  No adenopathy evident. 5.  Aortic Atherosclerosis (ICD10-I70.0). Electronically Signed   By: Lowella Grip III M.D.   On: 11/12/2019 14:43   CT Super D Chest Wo Contrast  Result Date: 11/06/2019 CLINICAL DATA:  86 year old male with history of pulmonary nodule. Followup study. EXAM: CT CHEST WITHOUT CONTRAST TECHNIQUE: Multidetector CT imaging of the chest was performed using thin slice collimation for electromagnetic bronchoscopy planning purposes, without  intravenous contrast. COMPARISON:  Chest CT 10/23/2019. FINDINGS: Cardiovascular: Heart size is borderline enlarged. There is no significant pericardial fluid, thickening or pericardial calcification. There is aortic atherosclerosis, as well as atherosclerosis of the great vessels of the mediastinum and the coronary arteries, including calcified atherosclerotic plaque in the left main, left anterior descending, left circumflex and right coronary arteries. Severe calcifications of the aortic valve. Mediastinum/Nodes: No pathologically enlarged mediastinal or hilar lymph nodes. Please note that accurate exclusion of hilar adenopathy is limited on noncontrast CT scans. Small hiatal hernia. No axillary lymphadenopathy. Lungs/Pleura: Previously noted left upper lobe pulmonary mass is similar to the prior study, currently measuring 2.2 x 3.3 x 1.7 cm (axial image 71 of series 3 and sagittal image 92 of series 6), again with macrolobulated and slightly spiculated margins, with extension to the overlying pleura which is mildly retracted. 3 mm subpleural nodule in the periphery of the right lower lobe (axial image 100 of series 3) is unchanged. 3 mm subpleural nodule in the periphery of the superior segment of the right lower lobe (axial image 60 of series 3), unchanged. No other suspicious appearing pulmonary nodules or masses are noted. No acute consolidative airspace disease. No pleural effusions. Mild chronic scarring in the lateral segment of the right middle lobe is again noted. Diffuse bronchial wall thickening with mild centrilobular and paraseptal emphysema. Upper Abdomen: Severe atrophy of the right kidney where there are multiple low-attenuation lesions, which are incompletely characterized on today's non-contrast CT examination, but similar to the prior study and statistically likely to represent cysts, measuring up to 2.6 cm. Aortic atherosclerosis. Numerous small calcified gallstones lying dependently in the  gallbladder. Subcentimeter low-attenuation lesions in the liver, incompletely characterized on today's noncontrast CT examination. Musculoskeletal: There are no aggressive appearing lytic or blastic lesions noted in the visualized portions of the skeleton. IMPRESSION: 1. Similar macrolobulated and spiculated left upper lobe mass which measures 2.2 x 3.3 x 1.7 cm. 2. Stable 3 mm subpleural nodules in the right lower lobe, nonspecific, but statistically likely benign subpleural lymph nodes. 3. Diffuse bronchial wall thickening with mild centrilobular and paraseptal emphysema; imaging findings suggestive of underlying COPD. 4. Aortic atherosclerosis, in addition to left main and 3 vessel coronary artery disease. 5. There are calcifications of the aortic valve. Echocardiographic correlation for evaluation of potential valvular dysfunction may be warranted if clinically indicated. 6. Additional incidental findings, as above. Aortic Atherosclerosis (ICD10-I70.0). Electronically Signed   By: Vinnie Langton M.D.   On: 11/06/2019 11:18   DG C-ARM BRONCHOSCOPY  Result Date: 11/12/2019 C-ARM BRONCHOSCOPY: Fluoroscopy was utilized by the requesting physician.  No radiographic interpretation.      IMPRESSION: Stage IA3 (T1c,N0, M0) vs. stage IIIA (T1c,N2, M0) squamous cell carcinoma of the left upper lobe  Today I discussed  the findings thus far with the patient.  We reviewed his chest CT scan images as well as his PET scan images.  I discussed with the patient that I am unsure whether he has clinical stage I or stage IIIa disease.  One mediastinal lymph node showed mild activity.  We discussed the implications concerning treatment depending on whether he has stage I or stage III disease.  Recently the patient did not wish to consider surgical intervention but after speaking with him today he does wish to be considered for surgery if he is a reasonable candidate.  I discussed the general course of therapy including  stereotactic body radiation therapy or daily radiation therapy for 6 weeks if he is found to have stage III disease.  Patient's case was presented at the multidisciplinary thoracic oncology conference earlier today and it was the impression from this conference that the patient should be evaluated for surgery if he is medically fit.  Patient will need to have pulmonary function studies and EBUS prior to being considered for surgery. He appears to have excellent performance status for his age of 48 per my impression today.   PLAN: Patient will be scheduled for consultation with cardiothoracic surgery in the near future.    ------------------------------------------------  Blair Promise, PhD, MD  This document serves as a record of services personally performed by Gery Pray, MD. It was created on his behalf by Clerance Lav, a trained medical scribe. The creation of this record is based on the scribe's personal observations and the provider's statements to them. This document has been checked and approved by the attending provider.

## 2019-11-25 ENCOUNTER — Other Ambulatory Visit: Payer: Self-pay | Admitting: *Deleted

## 2019-11-25 NOTE — Progress Notes (Signed)
The proposed treatment discussed in cancer conference 11/21/19 is for discussion purpose only and is not a binding recommendation.  The patient was not physically examined nor present for their treatment options.  Therefore, final treatment plans cannot be decided.

## 2019-11-27 ENCOUNTER — Other Ambulatory Visit: Payer: Self-pay

## 2019-11-27 ENCOUNTER — Encounter: Payer: Self-pay | Admitting: Pulmonary Disease

## 2019-11-27 ENCOUNTER — Ambulatory Visit: Payer: Medicare PPO | Admitting: Pulmonary Disease

## 2019-11-27 VITALS — BP 124/58 | HR 57 | Temp 97.5°F | Ht 73.0 in | Wt 196.6 lb

## 2019-11-27 DIAGNOSIS — R911 Solitary pulmonary nodule: Secondary | ICD-10-CM

## 2019-11-27 DIAGNOSIS — C3492 Malignant neoplasm of unspecified part of left bronchus or lung: Secondary | ICD-10-CM | POA: Diagnosis not present

## 2019-11-27 NOTE — Patient Instructions (Addendum)
Thank you for visiting Dr. Valeta Harms at [facility]. Today we recommend the following:  Orders Placed This Encounter  Procedures  . Pulmonary Function Test   PFTS to be scheduled ASAP  Return in about 3 months (around 02/27/2020) for w/ Dr. Valeta Harms .    Please do your part to reduce the spread of COVID-19.

## 2019-11-27 NOTE — Progress Notes (Signed)
Synopsis: Referred in April 2021 for lung mass by No ref. provider found  Subjective:   PATIENT ID: Richard Vincent. GENDER: male DOB: 05-Feb-1932, MRN: [id number]  Chief Complaint  Patient presents with  . Follow-up    This is an 86 year old gentleman past medical history of asthma, hypertension, A. fib on anticoagulation, gastroesophageal reflux disease.  Patient was referred to our clinic for abnormality and CT imaging, CT of the chest on 10/23/2019 which revealed a new spiculated 3 cm left upper lobe lung mass concerning for primary bronchogenic carcinoma.  Patient is a former smoker quit 1996. He has no complaints today. Overall has a good functional status. Able to complete all of his activities of daily living. He lives with his wife. Able to push mow the yard. He is on Eliquis for stroke prevention and atrial fibrillation.  OV 11/27/2019: Here today for follow-up after recent bronchoscopy.  Doing well at this time.  Reviewed recent pathology results.  Patient has been seen by radiation oncology already.  Patient is anxious today about all of this new recent news.  At this point no additional respiratory symptoms.  Considering evaluation for surgery.  Referral has already been placed to see Dr. Roxan Hockey.   Past Medical History:  Diagnosis Date  . Asthma    childhood   . Basal cell cancer    nose  . BPH (benign prostatic hyperplasia)   . GERD (gastroesophageal reflux disease)    occ  . Gout    changed diet years ago ; no gout attacks since   . Hypertension   . Paroxysmal A-fib (HCC)    occurred post-operatively ; last EKG he was in [facility]R   . Renal insufficiency    non problematic      Family History  Problem Relation Age of Onset  . Coronary artery disease Other        No family history     Past Surgical History:  Procedure Laterality Date  . BRONCHIAL BIOPSY  11/12/2019   Procedure: BRONCHIAL BIOPSIES;  Surgeon: [name], DO;  Location: [facility];   Service: Pulmonary;;  . BRONCHIAL BRUSHINGS  11/12/2019   Procedure: BRONCHIAL BRUSHINGS;  Surgeon: [name], DO;  Location: [facility];  Service: Pulmonary;;  . BRONCHIAL WASHINGS  11/12/2019   Procedure: BRONCHIAL WASHINGS;  Surgeon: [name], DO;  Location: [facility];  Service: Pulmonary;;  . ENDOBRONCHIAL ULTRASOUND  11/12/2019   Procedure: ENDOBRONCHIAL ULTRASOUND;  Surgeon: [name], DO;  Location: [facility];  Service: Pulmonary;;  . EYE SURGERY  about 5-6 years ago    cataracts bilateral   . FIDUCIAL MARKER PLACEMENT  11/12/2019   Procedure: FIDUCIAL MARKER PLACEMENT;  Surgeon: [name], DO;  Location: [facility];  Service: Pulmonary;;  . FINE NEEDLE ASPIRATION  11/12/2019   Procedure: FINE NEEDLE ASPIRATION (FNA) LINEAR;  Surgeon: [name], DO;  Location: [facility];  Service: Pulmonary;;  . NOSE SURGERY    . Skin cancer removed    . TONSILLECTOMY    . TRANSURETHRAL RESECTION OF PROSTATE    . VIDEO BRONCHOSCOPY WITH ENDOBRONCHIAL NAVIGATION N/A 11/12/2019   Procedure: VIDEO BRONCHOSCOPY WITH ENDOBRONCHIAL NAVIGATION;  Surgeon: [name], DO;  Location: [facility];  Service: Pulmonary;  Laterality: N/A;  . XI ROBOTIC ASSISTED SIMPLE PROSTATECTOMY N/A 12/14/2017   Procedure: XI ROBOTIC ASSISTED SIMPLE PROSTATECTOMY;  Surgeon: Cleon Gustin, MD;  Location: [facility];  Service: Urology;  Laterality: N/A;  Social History   Socioeconomic History  . Marital status: Married    Spouse name: Not on file  . Number of children: 6  . Years of education: Not on file  . Highest education level: Not on file  Occupational History    Comment: Engineer  Tobacco Use  . Smoking status: Former Smoker    Packs/day: 1.00    Years: 30.00    Pack years: 30.00    Types: Cigarettes    Quit date: 10/17/1990    Years since quitting: 29.1  . Smokeless tobacco: Never Used  Substance and Sexual Activity  . Alcohol use: Yes    Alcohol/week:  14.0 standard drinks    Types: 14 Glasses of wine per week    Comment: Occasional  . Drug use: No  . Sexual activity: Not on file  Other Topics Concern  . Not on file  Social History Narrative  . Not on file   Social Determinants of Health   Financial Resource Strain:   . Difficulty of Paying Living Expenses:   Food Insecurity:   . Worried About Charity fundraiser in the Last Year:   . Arboriculturist in the Last Year:   Transportation Needs:   . Film/video editor (Medical):   Marland Kitchen Lack of Transportation (Non-Medical):   Physical Activity:   . Days of Exercise per Week:   . Minutes of Exercise per Session:   Stress:   . Feeling of Stress :   Social Connections:   . Frequency of Communication with Friends and Family:   . Frequency of Social Gatherings with Friends and Family:   . Attends Religious Services:   . Active Member of Clubs or Organizations:   . Attends Archivist Meetings:   Marland Kitchen Marital Status:   Intimate Partner Violence:   . Fear of Current or Ex-Partner:   . Emotionally Abused:   Marland Kitchen Physically Abused:   . Sexually Abused:      Allergies  Allergen Reactions  . Latex Rash    Mild rash     Outpatient Medications Prior to Visit  Medication Sig Dispense Refill  . amLODipine (NORVASC) 10 MG tablet TAKE ONE TABLET BY MOUTH DAILY (Patient taking differently: Take 10 mg by mouth daily. ) 90 tablet 3  . ascorbic acid (VITAMIN C) 500 MG tablet Take 500 mg by mouth every evening.    . cholecalciferol (VITAMIN D3) 25 MCG (1000 UNIT) tablet Take 1,000 Units by mouth in the morning and at bedtime.    Marland Kitchen ELIQUIS 2.5 MG TABS tablet TAKE ONE TABLET BY MOUTH TWICE A DAY (Patient taking differently: Take 2.5 mg by mouth 2 (two) times daily. ) 60 tablet 2  . Investigational - Study Medication Take 1 tablet by mouth daily. Preventable study drug: Atorvastatin 40 mg or placebo    . magnesium hydroxide (MILK OF MAGNESIA) 400 MG/5ML suspension Take 15 mLs by mouth  daily as needed for mild constipation.    . metoprolol succinate (TOPROL-XL) 50 MG 24 hr tablet TAKE ONE TABLET BY MOUTH DAILY; OFFICE VISIT NEEDED FOR MORE REFILLS (Patient taking differently: Take 50 mg by mouth daily. ) 90 tablet 3  . Psyllium (METAMUCIL PO) Take 2 capsules by mouth every evening. Take 1 tablespoonful with water every evening      No facility-administered medications prior to visit.    Review of Systems  Constitutional: Negative for chills, fever, malaise/fatigue and weight loss.  HENT: Negative for hearing loss,  sore throat and tinnitus.   Eyes: Negative for blurred vision and double vision.  Respiratory: Positive for cough. Negative for hemoptysis, sputum production, shortness of breath, wheezing and stridor.   Cardiovascular: Negative for chest pain, palpitations, orthopnea, leg swelling and PND.  Gastrointestinal: Negative for abdominal pain, constipation, diarrhea, heartburn, nausea and vomiting.  Genitourinary: Negative for dysuria, hematuria and urgency.  Musculoskeletal: Negative for joint pain and myalgias.  Skin: Negative for itching and rash.  Neurological: Negative for dizziness, tingling, weakness and headaches.  Endo/Heme/Allergies: Negative for environmental allergies. Does not bruise/bleed easily.  Psychiatric/Behavioral: Negative for depression. The patient is not nervous/anxious and does not have insomnia.   All other systems reviewed and are negative.    Objective:  Physical Exam Vitals reviewed.  Constitutional:      General: He is not in acute distress.    Appearance: He is well-developed.  HENT:     Head: Normocephalic and atraumatic.  Eyes:     General: No scleral icterus.    Conjunctiva/sclera: Conjunctivae normal.     Pupils: Pupils are equal, round, and reactive to light.  Neck:     Vascular: No JVD.     Trachea: No tracheal deviation.  Cardiovascular:     Rate and Rhythm: Normal rate. Rhythm irregular.     Heart sounds: Normal  heart sounds. No murmur.  Pulmonary:     Effort: Pulmonary effort is normal. No tachypnea, accessory muscle usage or respiratory distress.     Breath sounds: Normal breath sounds. No stridor. No wheezing, rhonchi or rales.  Abdominal:     General: Bowel sounds are normal. There is no distension.     Palpations: Abdomen is soft.     Tenderness: There is no abdominal tenderness.  Musculoskeletal:        General: No tenderness.     Cervical back: Neck supple.  Lymphadenopathy:     Cervical: No cervical adenopathy.  Skin:    General: Skin is warm and dry.     Capillary Refill: Capillary refill takes less than 2 seconds.     Findings: No rash.  Neurological:     Mental Status: He is alert and oriented to person, place, and time.  Psychiatric:        Behavior: Behavior normal.      Vitals:   11/27/19 1154  BP: (!) 124/58  Pulse: (!) 57  Temp: (!) 97.5 F (36.4 C)  TempSrc: Temporal  SpO2: 99%  Weight: 196 lb 9.6 oz (89.2 kg)  Height: 6\' 1"  (1.854 m)   99% on RA BMI Readings from Last 3 Encounters:  11/27/19 25.94 kg/m  11/21/19 25.36 kg/m  11/12/19 26.39 kg/m   Wt Readings from Last 3 Encounters:  11/27/19 196 lb 9.6 oz (89.2 kg)  11/21/19 192 lb 3.2 oz (87.2 kg)  11/12/19 200 lb (90.7 kg)     CBC    Component Value Date/Time   WBC 6.1 11/12/2019 0853   RBC 4.71 11/12/2019 0853   HGB 14.7 11/12/2019 0853   HGB 14.4 10/16/2019 1003   HCT 45.8 11/12/2019 0853   HCT 44.3 10/16/2019 1003   PLT 227 11/12/2019 0853   PLT 227 10/16/2019 1003   MCV 97.2 11/12/2019 0853   MCV 96 10/16/2019 1003   MCH 31.2 11/12/2019 0853   MCHC 32.1 11/12/2019 0853   RDW 12.5 11/12/2019 0853   RDW 12.0 10/16/2019 1003     Chest Imaging: CT chest 10/23/2019: Spiculated 3 cm left upper lobe lung  mass concerning for primary bronchogenic carcinoma. The patient's images have been independently reviewed by me.     Pulmonary Functions Testing Results: No flowsheet data  found.  FeNO: none   Pathology:  pathology: Consistent with squamous cell carcinoma of the lung.  Echocardiogram:   Study Conclusions   - Left ventricle: The cavity size was normal. Wall thickness was  increased in a pattern of moderate LVH. Systolic function was  normal. The estimated ejection fraction was in the range of 55%  to 60%. Wall motion was normal; there were no regional wall  motion abnormalities. Features are consistent with a pseudonormal  left ventricular filling pattern, with concomitant abnormal  relaxation and increased filling pressure (grade 2 diastolic  dysfunction).  - Aortic valve: Transvalvular velocity was minimally increased.  There was no stenosis. Peak velocity (S): 214 cm/s.  - Mitral valve: There was mild regurgitation.  - Left atrium: The atrium was mildly dilated.  - Tricuspid valve: There was mild regurgitation.  - Pulmonary arteries: Systolic pressure was mildly increased. PA  peak pressure: 34 mm Hg (S).   Heart Catheterization: none     Assessment & Plan:      ICD-10-CM   1. Lung nodule  R91.1 Pulmonary Function Test  2. Squamous cell carcinoma of left lung [facility])  C34.92     Discussion: 86 year old gentleman, former smoker, evidence of centrilobular emphysema on CT.  Incidental found 3.4 cm left upper lobe mass concerning for primary bronchogenic carcinoma status post video bronchoscopy with transbronchial biopsy via navigational bronchoscopy.  Biopsies proven squamous cell carcinoma of the left lung.  We reviewed pathology results today in the office.  Plan: PFTs ASAP. Referral already placed to cardiothoracic surgery for evaluation of resection. Return to clinic and see [place] in approximately 2 to 3 months.    Current Outpatient Medications:  .  amLODipine (NORVASC) 10 MG tablet, TAKE ONE TABLET BY MOUTH DAILY (Patient taking differently: Take 10 mg by mouth daily. ), Disp: 90 tablet, Rfl: 3 .  ascorbic acid  (VITAMIN C) 500 MG tablet, Take 500 mg by mouth every evening., Disp: , Rfl:  .  cholecalciferol (VITAMIN D3) 25 MCG (1000 UNIT) tablet, Take 1,000 Units by mouth in the morning and at bedtime., Disp: , Rfl:  .  ELIQUIS 2.5 MG TABS tablet, TAKE ONE TABLET BY MOUTH TWICE A DAY (Patient taking differently: Take 2.5 mg by mouth 2 (two) times daily. ), Disp: 60 tablet, Rfl: 2 .  Investigational - Study Medication, Take 1 tablet by mouth daily. Preventable study drug: Atorvastatin 40 mg or placebo, Disp: , Rfl:  .  magnesium hydroxide (MILK OF MAGNESIA) 400 MG/5ML suspension, Take 15 mLs by mouth daily as needed for mild constipation., Disp: , Rfl:  .  metoprolol succinate (TOPROL-XL) 50 MG 24 hr tablet, TAKE ONE TABLET BY MOUTH DAILY; OFFICE VISIT NEEDED FOR MORE REFILLS (Patient taking differently: Take 50 mg by mouth daily. ), Disp: 90 tablet, Rfl: 3 .  Psyllium (METAMUCIL PO), Take 2 capsules by mouth every evening. Take 1 tablespoonful with water every evening , Disp: , Rfl:    [name], DO [facility] 11/27/2019 12:04 PM

## 2019-11-28 ENCOUNTER — Encounter: Payer: Self-pay | Admitting: *Deleted

## 2019-11-28 NOTE — Progress Notes (Signed)
I received a call from Dr. Sondra Come.  He made referral to [facility]/Dr. Roxan Hockey.  I contacted [facility] and updated them on needed appt after PFT's on 5/20.

## 2019-12-02 ENCOUNTER — Other Ambulatory Visit (HOSPITAL_COMMUNITY)
Admission: RE | Admit: 2019-12-02 | Discharge: 2019-12-02 | Disposition: A | Discharge status: Home / Self Care | Payer: Medicare PPO | Source: Ambulatory Visit | Attending: Pulmonary Disease | Admitting: Pulmonary Disease

## 2019-12-02 DIAGNOSIS — Z01812 Encounter for preprocedural laboratory examination: Secondary | ICD-10-CM | POA: Insufficient documentation

## 2019-12-02 DIAGNOSIS — Z20822 Contact with and (suspected) exposure to covid-19: Secondary | ICD-10-CM | POA: Insufficient documentation

## 2019-12-03 LAB — SARS CORONAVIRUS 2 (TAT 6-24 HRS): SARS Coronavirus 2: NEGATIVE

## 2019-12-05 ENCOUNTER — Other Ambulatory Visit: Payer: Self-pay

## 2019-12-05 ENCOUNTER — Ambulatory Visit (HOSPITAL_COMMUNITY)
Admission: RE | Admit: 2019-12-05 | Discharge: 2019-12-05 | Disposition: A | Discharge status: Home / Self Care | Payer: Medicare PPO | Source: Ambulatory Visit | Attending: Pulmonary Disease | Admitting: Pulmonary Disease

## 2019-12-05 DIAGNOSIS — J988 Other specified respiratory disorders: Secondary | ICD-10-CM | POA: Diagnosis not present

## 2019-12-05 DIAGNOSIS — R911 Solitary pulmonary nodule: Secondary | ICD-10-CM | POA: Diagnosis present

## 2019-12-05 LAB — PULMONARY FUNCTION TEST
DL/VA % pred: 64 %
DL/VA: 2.44 ml/min/mmHg/L
DLCO unc % pred: 60 %
DLCO unc: 15.8 ml/min/mmHg
FEF 25-75 Post: 1.88 L/sec
FEF 25-75 Pre: 1 L/sec
FEF2575-%Change-Post: 87 %
FEF2575-%Pred-Post: 93 %
FEF2575-%Pred-Pre: 49 %
FEV1-%Change-Post: 14 %
FEV1-%Pred-Post: 92 %
FEV1-%Pred-Pre: 80 %
FEV1-Post: 2.84 L
FEV1-Pre: 2.47 L
FEV1FVC-%Change-Post: 11 %
FEV1FVC-%Pred-Pre: 83 %
FEV6-%Change-Post: 10 %
FEV6-%Pred-Post: 102 %
FEV6-%Pred-Pre: 93 %
FEV6-Post: 4.16 L
FEV6-Pre: 3.77 L
FEV6FVC-%Change-Post: 7 %
FEV6FVC-%Pred-Post: 103 %
FEV6FVC-%Pred-Pre: 96 %
FVC-%Change-Post: 3 %
FVC-%Pred-Post: 100 %
FVC-%Pred-Pre: 96 %
FVC-Post: 4.33 L
FVC-Pre: 4.19 L
Post FEV1/FVC ratio: 66 %
Post FEV6/FVC ratio: 96 %
Pre FEV1/FVC ratio: 59 %
Pre FEV6/FVC Ratio: 90 %
RV % pred: 118 %
RV: 3.47 L
TLC % pred: 99 %
TLC: 7.66 L

## 2019-12-05 MED ORDER — ALBUTEROL SULFATE (2.5 MG/3ML) 0.083% IN NEBU
2.5000 mg | INHALATION_SOLUTION | Freq: Once | RESPIRATORY_TRACT | Status: AC
  Administered 2019-12-05: 2.5 mg via RESPIRATORY_TRACT

## 2019-12-10 ENCOUNTER — Institutional Professional Consult (permissible substitution): Payer: Medicare PPO | Admitting: Thoracic Surgery (Cardiothoracic Vascular Surgery)

## 2019-12-10 ENCOUNTER — Encounter: Payer: Self-pay | Admitting: Thoracic Surgery (Cardiothoracic Vascular Surgery)

## 2019-12-10 ENCOUNTER — Other Ambulatory Visit: Payer: Self-pay

## 2019-12-10 VITALS — BP 130/69 | HR 69 | Temp 98.2°F | Resp 18 | Ht 73.0 in | Wt 193.4 lb

## 2019-12-10 DIAGNOSIS — C3412 Malignant neoplasm of upper lobe, left bronchus or lung: Secondary | ICD-10-CM | POA: Diagnosis not present

## 2019-12-10 NOTE — Progress Notes (Signed)
PCP is Patient, No Pcp Per Referring Provider is Icard, Octavio Graves, DO  Chief Complaint  Patient presents with  . Lung Cancer    new patient, PET 4/26, Chest CT 4/21, Super D CT 4/21, PFTs 5/20    HPI: Richard Vincent is sent for consultation regarding a non-small cell carcinoma of the left upper lobe.  Richard Vincent is an 86 year old retired [employer] with a past medical history significant for tobacco abuse (quit 1992), hypertension, reflux, benign prostatic hypertrophy, prostatectomy, gout, and paroxysmal atrial fibrillation.  Earlier this spring he developed fevers and chills.  He went to an urgent care.  He ultimately was found to have a urinary tract infection but as part of his evaluation he had a chest x-ray which showed a lung nodule.  That led to a CT of the chest which confirmed a spiculated left upper lobe lung nodule.  He was referred to Dr. Valeta Harms.  On PET CT the left upper lobe nodule was hypermetabolic with an SUV max of 11.3.  There was a low left paratracheal node which showed minimal hypermetabolism with an SUV of 3.1.  The corresponding lymph node measured 8 mm.  Navigational bronchoscopy was positive for non-small cell carcinoma.  Overall he feels well.  He is he remains active.  He does his own yard work. He is not having any chest pain, pressure, or tightness with exertion.  He has shortness of breath only with heavy exertion.  He can walk up a flight of stairs without stopping or feeling short of breath.  Is not had any significant change in appetite or weight loss.  He does complain of a lack of energy.  He has not had any unusual headaches or visual changes.  Zubrod Score: At the time of surgery this patient's most appropriate activity status/level should be described as: []     0    Normal activity, no symptoms [x]     1    Restricted in physical strenuous activity but ambulatory, able to do out light work []     2    Ambulatory and capable of self care, unable to do  work activities, up and about >50 % of waking hours                              []     3    Only limited self care, in bed greater than 50% of waking hours []     4    Completely disabled, no self care, confined to bed or chair []     5    Moribund  Past Medical History:  Diagnosis Date  . Asthma    childhood   . Basal cell cancer    nose  . BPH (benign prostatic hyperplasia)   . GERD (gastroesophageal reflux disease)    occ  . Gout    changed diet years ago ; no gout attacks since   . Hypertension   . Paroxysmal A-fib (HCC)    occurred post-operatively ; last EKG he was in NSR   . Renal insufficiency    non problematic     Past Surgical History:  Procedure Laterality Date  . BRONCHIAL BIOPSY  11/12/2019   Procedure: BRONCHIAL BIOPSIES;  Surgeon: [name], DO;  Location: [facility];  Service: Pulmonary;;  . BRONCHIAL BRUSHINGS  11/12/2019   Procedure: BRONCHIAL BRUSHINGS;  Surgeon: [name], DO;  Location: [facility];  Service: Pulmonary;;  .  BRONCHIAL WASHINGS  11/12/2019   Procedure: BRONCHIAL WASHINGS;  Surgeon: [name], DO;  Location: [facility];  Service: Pulmonary;;  . ENDOBRONCHIAL ULTRASOUND  11/12/2019   Procedure: ENDOBRONCHIAL ULTRASOUND;  Surgeon: [name], DO;  Location: [facility];  Service: Pulmonary;;  . EYE SURGERY  about 5-6 years ago    cataracts bilateral   . FIDUCIAL MARKER PLACEMENT  11/12/2019   Procedure: FIDUCIAL MARKER PLACEMENT;  Surgeon: [name], DO;  Location: [facility];  Service: Pulmonary;;  . FINE NEEDLE ASPIRATION  11/12/2019   Procedure: FINE NEEDLE ASPIRATION (FNA) LINEAR;  Surgeon: [name], DO;  Location: [facility];  Service: Pulmonary;;  . NOSE SURGERY    . Skin cancer removed    . TONSILLECTOMY    . TRANSURETHRAL RESECTION OF PROSTATE    . VIDEO BRONCHOSCOPY WITH ENDOBRONCHIAL NAVIGATION N/A 11/12/2019   Procedure: VIDEO BRONCHOSCOPY WITH ENDOBRONCHIAL NAVIGATION;  Surgeon: [name], DO;  Location: [facility];  Service: Pulmonary;  Laterality: N/A;  . XI ROBOTIC ASSISTED SIMPLE PROSTATECTOMY N/A 12/14/2017   Procedure: XI ROBOTIC ASSISTED SIMPLE PROSTATECTOMY;  Surgeon: Cleon Gustin, MD;  Location: [facility];  Service: Urology;  Laterality: N/A;    Family History  Problem Relation Age of Onset  . Coronary artery disease Other        No family history    Social History Social History   Tobacco Use  . Smoking status: Former Smoker    Packs/day: 1.00    Years: 30.00    Pack years: 30.00    Types: Cigarettes    Quit date: 10/17/1990    Years since quitting: 29.1  . Smokeless tobacco: Never Used  Substance Use Topics  . Alcohol use: Yes    Alcohol/week: 14.0 standard drinks    Types: 14 Glasses of wine per week    Comment: Occasional  . Drug use: No    Current Outpatient Medications  Medication Sig Dispense Refill  . amLODipine (NORVASC) 10 MG tablet TAKE ONE TABLET BY MOUTH DAILY (Patient taking differently: Take 10 mg by mouth daily. ) 90 tablet 3  . ascorbic acid (VITAMIN C) 500 MG tablet Take 500 mg by mouth every evening.    . cholecalciferol (VITAMIN D3) 25 MCG (1000 UNIT) tablet Take 1,000 Units by mouth in the morning and at bedtime.    Marland Kitchen ELIQUIS 2.5 MG TABS tablet TAKE ONE TABLET BY MOUTH TWICE A DAY (Patient taking differently: Take 2.5 mg by mouth 2 (two) times daily. ) 60 tablet 2  . Investigational - Study Medication Take 1 tablet by mouth daily. Preventable study drug: Atorvastatin 40 mg or placebo    . magnesium hydroxide (MILK OF MAGNESIA) 400 MG/5ML suspension Take 15 mLs by mouth daily as needed for mild constipation.    . metoprolol succinate (TOPROL-XL) 50 MG 24 hr tablet TAKE ONE TABLET BY MOUTH DAILY; OFFICE VISIT NEEDED FOR MORE REFILLS (Patient taking differently: Take 50 mg by mouth daily. ) 90 tablet 3  . Psyllium (METAMUCIL PO) Take 2 capsules by mouth every evening. Take 1 tablespoonful with water every evening       No current facility-administered medications for this visit.    Allergies  Allergen Reactions  . Latex Rash    Mild rash    Review of Systems  Constitutional: Negative for activity change, fever and unexpected weight change.       Decreased energy  HENT: Positive for dental problem (Dentures) and hearing loss.  Negative for trouble swallowing and voice change.   Eyes: Negative for visual disturbance.  Respiratory: Negative for shortness of breath and wheezing.   Cardiovascular: Negative for chest pain and leg swelling.  Gastrointestinal: Positive for constipation. Negative for abdominal distention and abdominal pain.  Genitourinary: Positive for frequency.       Prostate surgery  Neurological: Negative for seizures, syncope and headaches.  Hematological: Negative for adenopathy. Bruises/bleeds easily (On Eliquis).  All other systems reviewed and are negative.   BP 130/69 (BP Location: Left Arm, Patient Position: Sitting, Cuff Size: Normal)   Pulse 69   Temp 98.2 F (36.8 C)   Resp 18   Ht 6\' 1"  (1.854 m)   Wt 193 lb 6.4 oz (87.7 kg)   SpO2 97% Comment: RA  BMI 25.52 kg/m  Physical Exam Vitals reviewed.  Constitutional:      General: He is not in acute distress.    Appearance: Normal appearance.  HENT:     Head: Normocephalic and atraumatic.  Eyes:     General: No scleral icterus.    Extraocular Movements: Extraocular movements intact.  Neck:     Vascular: No carotid bruit.  Cardiovascular:     Rate and Rhythm: Rhythm irregular.     Heart sounds: Normal heart sounds. No murmur.  Pulmonary:     Effort: Pulmonary effort is normal. No respiratory distress.     Breath sounds: Normal breath sounds. No wheezing or rales.  Abdominal:     General: There is no distension.     Palpations: Abdomen is soft.     Tenderness: There is no abdominal tenderness.  Lymphadenopathy:     Cervical: No cervical adenopathy.  Skin:    General: Skin is warm and dry.  Neurological:      General: No focal deficit present.     Mental Status: He is alert and oriented to person, place, and time.     Cranial Nerves: No cranial nerve deficit.     Motor: No weakness.    Diagnostic Tests: CT CHEST WITHOUT CONTRAST  TECHNIQUE: Multidetector CT imaging of the chest was performed using thin slice collimation for electromagnetic bronchoscopy planning purposes, without intravenous contrast.  COMPARISON:  Chest CT 10/23/2019.  FINDINGS: Cardiovascular: Heart size is borderline enlarged. There is no significant pericardial fluid, thickening or pericardial calcification. There is aortic atherosclerosis, as well as atherosclerosis of the great vessels of the mediastinum and the coronary arteries, including calcified atherosclerotic plaque in the left main, left anterior descending, left circumflex and right coronary arteries. Severe calcifications of the aortic valve.  Mediastinum/Nodes: No pathologically enlarged mediastinal or hilar lymph nodes. Please note that accurate exclusion of hilar adenopathy is limited on noncontrast CT scans. Small hiatal hernia. No axillary lymphadenopathy.  Lungs/Pleura: Previously noted left upper lobe pulmonary mass is similar to the prior study, currently measuring 2.2 x 3.3 x 1.7 cm (axial image 71 of series 3 and sagittal image 92 of series 6), again with macrolobulated and slightly spiculated margins, with extension to the overlying pleura which is mildly retracted. 3 mm subpleural nodule in the periphery of the right lower lobe (axial image 100 of series 3) is unchanged. 3 mm subpleural nodule in the periphery of the superior segment of the right lower lobe (axial image 60 of series 3), unchanged. No other suspicious appearing pulmonary nodules or masses are noted. No acute consolidative airspace disease. No pleural effusions. Mild chronic scarring in the lateral segment of the right middle lobe is again  noted.  Diffuse bronchial wall thickening with mild centrilobular and paraseptal emphysema.  Upper Abdomen: Severe atrophy of the right kidney where there are multiple low-attenuation lesions, which are incompletely characterized on today's non-contrast CT examination, but similar to the prior study and statistically likely to represent cysts, measuring up to 2.6 cm. Aortic atherosclerosis. Numerous small calcified gallstones lying dependently in the gallbladder. Subcentimeter low-attenuation lesions in the liver, incompletely characterized on today's noncontrast CT examination.  Musculoskeletal: There are no aggressive appearing lytic or blastic lesions noted in the visualized portions of the skeleton.  IMPRESSION: 1. Similar macrolobulated and spiculated left upper lobe mass which measures 2.2 x 3.3 x 1.7 cm. 2. Stable 3 mm subpleural nodules in the right lower lobe, nonspecific, but statistically likely benign subpleural lymph nodes. 3. Diffuse bronchial wall thickening with mild centrilobular and paraseptal emphysema; imaging findings suggestive of underlying COPD. 4. Aortic atherosclerosis, in addition to left main and 3 vessel coronary artery disease. 5. There are calcifications of the aortic valve. Echocardiographic correlation for evaluation of potential valvular dysfunction may be warranted if clinically indicated. 6. Additional incidental findings, as above.  Aortic Atherosclerosis (ICD10-I70.0).   Electronically Signed   By: Vinnie Langton M.D.   On: 11/06/2019 11:18 NUCLEAR MEDICINE PET SKULL BASE TO THIGH  TECHNIQUE: 9.9 mCi F-18 FDG was injected intravenously. Full-ring PET imaging was performed from the skull base to thigh after the radiotracer. CT data was obtained and used for attenuation correction and anatomic localization.  Fasting blood glucose: 89 mg/dl  COMPARISON:  CT chest [id number].  FINDINGS: Mediastinal blood pool activity: SUV max  2.9  Liver activity: SUV max NA  NECK:  Mild hypermetabolism along the roof of the mouth, without a CT correlate. No hypermetabolic lymph nodes.  Incidental CT findings:  None.  CHEST:  Left upper lobe nodule measures 1.8 x 2.1 cm (8/37) with an SUV max of 11.3. Subcentimeter short axis low left paratracheal and left hilar lymph nodes show minimal hypermetabolism. Index low left paratracheal node measures 8 mm (4/68) with an SUV max of 3 1. No hypermetabolic contralateral mediastinal or hilar lymph nodes. No hypermetabolic axillary adenopathy  Incidental CT findings:  Atherosclerotic calcification of the aorta, aortic valve and arteries. Heart are enlarged. No pericardial effusion.  ABDOMEN/PELVIS:  Vague mild hypermetabolism within both adrenal glands and no definite CT correlates. No abnormal hypermetabolism in the liver, spleen or pancreas. No hypermetabolic lymph nodes.  Incidental CT findings:  Subcentimeter low-attenuation lesions in the liver are too small to characterize. Stones are seen in the gallbladder. Adrenal glands are unremarkable. Right kidney is atrophic. Kidneys contain low-attenuation lesions measuring up to 2.6 cm on the right which are likely cysts but definitive characterization is limited due to size and/or lack of post-contrast imaging. Spleen, pancreas, stomach and bowel are unremarkable with exception of a tiny hiatal hernia. Atherosclerotic calcification of the aorta. Infrarenal aorta measures 2.9 cm. Bladder wall thickening with a possible TURP defect.  SKELETON:  No abnormal osseous hypermetabolism.  Incidental CT findings:  Degenerative changes in the spine. No worrisome lytic or sclerotic lesions.  IMPRESSION: 1. Hypermetabolic lingular nodule with mildly hypermetabolic ipsilateral mediastinal/hilar lymph nodes. Findings are indicative of primary bronchogenic carcinoma (likely T1cN2M0 or stage  IIIA disease). 2. Cholelithiasis. 3. Ectatic abdominal aorta at risk for aneurysm development. Recommend followup by ultrasound in 5 years. This recommendation follows ACR consensus guidelines: White Paper of the ACR Incidental Findings Committee II on Vascular Findings. J Am Coll Radiol 2013; 10:789-794.  4.  Aortic atherosclerosis (ICD10-I70.0).   Electronically Signed   By: Lorin Picket M.D.   On: 11/11/2019 09:36 I personally reviewed the CT and PET/CT images and concur with the findings noted above  Pulmonary function testing 12/05/2019 FVC 4.19 (96%) FEV1 2.47 (80%) FEV1 2.84 (92%) postbronchodilator TLC 7.66 (99%) DLCO 15.80 (60%)  Impression: Richard Vincent is an 86 year old former smoker with a past medical history significant for hypertension, reflux, benign prostatic hypertrophy, prostatectomy, gout, and paroxysmal atrial fibrillation.  He has a 30-pack-year history of smoking prior to quitting in 1992.  He recently was found to have a 2.2 x 3.3 x 1.7 cm left upper lobe spiculated nodule.  He had a PET/CT which showed the nodule was hypermetabolic.  There also was some activity in some hilar and paratracheal lymph nodes although the nodes did not appear to be pathologically enlarged.  Navigational bronchoscopy revealed non-small cell carcinoma.  Findings are consistent with a T1N0 stage Ia lesion versus a T1N2 stage IIIa primary bronchogenic carcinoma.  Obviously the treatment would be vastly different depending on the status of the mediastinal lymph node.  Although the node is small I do think it should be evaluated prior to any surgical resection.  It also probably should be checked prior to initiation of treatment.  One option would be to treat with stereotactic radiation and just follow the node and I think that would be reasonable.  He is a little concerned about overly aggressive treatment given his age and even asked about doing nothing which I did advised  against.  We discussed the relative advantages and disadvantages of surgery versus radiation for treatment of lung cancer.  He understands it surgery provides a better chance of a cure albeit with operative risk.  Radiation is less likely to provide a cure but with less morbidity and risk involved.  I do think he is a potential surgical candidate.  Obviously at his age there is a high risk for morbidity and mortality.  However, he is active and has good pulmonary reserve with no other immediately life-threatening problems.  I discussed the general nature of surgical resection with him.  He understands the need for general anesthesia, the incision to be used, the use of drains to postoperatively, the expected hospital stay, and the overall recovery.  I informed him of the indications, risk, benefits, and alternatives.  He understands the risks include, but not limited to death, MI, DVT, PE, stroke, bleeding, possible need for transfusion, infection, air leaks, cardiac arrhythmias, as well as possibility of other unforeseeable complications.  He wishes to talk this over further with his family before he makes any decision and requested to have another consultation scheduled for 2 weeks to allow him to do so.  We will arrange for that appointment.  Plan: He will discuss these issues with his family and return to the office in 2 weeks  I spent 45 minutes in review of records, images, and in consultation with Richard Vincent today. Melrose Nakayama, MD [facility] [phone]

## 2019-12-24 ENCOUNTER — Ambulatory Visit: Payer: Medicare PPO | Admitting: Thoracic Surgery (Cardiothoracic Vascular Surgery)

## 2019-12-24 ENCOUNTER — Encounter: Payer: Self-pay | Admitting: Thoracic Surgery (Cardiothoracic Vascular Surgery)

## 2019-12-24 ENCOUNTER — Other Ambulatory Visit: Payer: Self-pay

## 2019-12-24 VITALS — BP 126/70 | HR 63 | Temp 97.0°F | Resp 20 | Ht 73.0 in | Wt 191.0 lb

## 2019-12-24 DIAGNOSIS — C3492 Malignant neoplasm of unspecified part of left bronchus or lung: Secondary | ICD-10-CM | POA: Diagnosis not present

## 2019-12-24 NOTE — Progress Notes (Signed)
      [address]       [place] [address]             [phone]      Richard Vincent returns with his wife to further discuss management of his left upper lobe lung cancer.  Richard Vincent is an 86 year old gentleman with a history of remote tobacco abuse, hypertension, reflux, benign prostatic hypertrophy, prostatectomy, gout, and paroxysmal atrial fibrillation after surgery.  He was found to have a lung nodule earlier this spring.  On PET CT that was hypermetabolic with an SUV of 65.5.  There also was a lower left paratracheal lymph node that had some minimal hypermetabolism with an SUV of 3.1.  Findings were consistent with non-small cell carcinoma, probably stage Ia (T1, N0), but possibly stage IIIa (T1, N2).  He underwent navigational bronchoscopy which was positive for non-small cell carcinoma.  EBUS was not performed.  He saw Dr. Sondra Come from radiation oncology.  I saw him in the office 2 weeks ago and discussed potential treatment options, including radiation and surgical resection.  He is older but in relatively good health and is a candidate for lobectomy, albeit with risk of morbidity and mortality.  He was uncertain as to how he would like to proceed when to talk to his family and has some time to think it over.  He now returns for further discussion.  I reviewed the images from the PET scan with [name].  We discussed surgery versus stereotactic radiation.  I again went through the relative advantages and disadvantages of each approach.  I answered their questions to the best of my ability.  He is still undecided.  I think he is strongly leaning towards radiation, but is waiting for some sort of external confirmation that that is the right decision.  I emphasized that it is really his decision.  His daughter who is a Therapist, sports is coming to visit him tomorrow and he will talk with her about that.  He will call and let us know if he would like to proceed with  surgical resection.  If he were to decide to proceed with surgical resection he would need endobronchial ultrasound prior to that to rule out involvement of the left paratracheal node.  I spent 15minutes in review of records, images, and in consultation with [name] today  Remo Lipps C. Roxan Hockey, MD [facility] [phone]

## 2020-01-08 NOTE — Progress Notes (Signed)
Patient here for a f/u new consult with Dr. Sondra Come.  Cardiothoracic Surgery  Progress Notes    Signed  Encounter Date:  12/24/2019          Signed         Show:Clear all [x] Manual[x] Template[] Copied  Added by: [x] Melrose Nakayama, MD  [] Hover for details      [address]       [place] [address]             [phone]                            Richard Vincent returns with his wife to further discuss management of his left upper lobe lung cancer.  Richard Vincent is an 86 year old gentleman with a history of remote tobacco abuse, hypertension, reflux, benign prostatic hypertrophy, prostatectomy, gout, and paroxysmal atrial fibrillation after surgery.  He was found to have a lung nodule earlier this spring.  On PET CT that was hypermetabolic with an SUV of 95.1.  There also was a lower left paratracheal lymph node that had some minimal hypermetabolism with an SUV of 3.1.  Findings were consistent with non-small cell carcinoma, probably stage Ia (T1, N0), but possibly stage IIIa (T1, N2).  He underwent navigational bronchoscopy which was positive for non-small cell carcinoma.  EBUS was not performed.  He saw Dr. Sondra Come from radiation oncology.  I saw him in the office 2 weeks ago and discussed potential treatment options, including radiation and surgical resection.  He is older but in relatively good health and is a candidate for lobectomy, albeit with risk of morbidity and mortality.  He was uncertain as to how he would like to proceed when to talk to his family and has some time to think it over.  He now returns for further discussion.  I reviewed the images from the PET scan with Mr. and Richard Vincent.  We discussed surgery versus stereotactic radiation.  I again went through the relative advantages and disadvantages of each approach.  I answered their questions to the best of my ability.  He is still undecided.  I think he is strongly leaning towards  radiation, but is waiting for some sort of external confirmation that that is the right decision.  I emphasized that it is really his decision.  His daughter who is a Therapist, sports is coming to visit him tomorrow and he will talk with her about that.  He will call and let us know if he would like to proceed with surgical resection.  If he were to decide to proceed with surgical resection he would need endobronchial ultrasound prior to that to rule out involvement of the left paratracheal node.  I spent 11minutes in review of records, images, and in consultation with Mr. and Richard Vincent today  Richard Vincent C. Richard Hockey, MD [facility] [phone]           Past/Anticipated interventions by cardiothoracic surgery, if any: no  Past/Anticipated interventions by medical oncology, if any: no  Signs/Symptoms  Weight changes, if any: lost 5-6 lbs in last 2 months  Respiratory complaints, if any: shortness of breath  Hemoptysis, if any: no  Pain issues, if any: non  SAFETY ISSUES:  Prior radiation? none  Pacemaker/ICD? none  Possible current pregnancy?no  Is the patient on methotrexate?none  Current Complaints / other details:  Here for a reconsult.   BP 139/77 (BP Location: Right  Arm)   Pulse (!) 52   Temp 98.9 F (37.2 C) (Oral)   Resp 18   Ht 6\' 1"  (1.854 m)   Wt 194 lb 12.8 oz (88.4 kg)   SpO2 100%   BMI 25.70 kg/m    Wt Readings from Last 3 Encounters:  01/13/20 194 lb 12.8 oz (88.4 kg)  12/24/19 191 lb (86.6 kg)  12/10/19 193 lb 6.4 oz (87.7 kg)     wt

## 2020-01-12 NOTE — Progress Notes (Signed)
Radiation Oncology         [phone]) [phone] ________________________________  Name: Richard Vincent. MRN: [id number]  Date: 01/13/2020  DOB: 1932-03-10  Re-Evaluation Note  CC: Patient, No Pcp Per  Icard, Bradley L, DO    ICD-10-CM   1. Primary cancer of left upper lobe of lung ([place])  C34.12   2. Lung mass  R91.8     Diagnosis: Stage IA3 (T1c,N0, M0) vs. stage IIIA (T1c,N2, M0) squamous cell carcinoma of the left upper lobe   Narrative:  The patient returns today to discuss radiation treatment options. He was seen in consultation on 11/21/2019, during which time it was not clear whether the patient had clinical stage I or stage IIIa disease. One mediastinal lymph node showed mild activity. Given that, there were implications concerning treatment depending on what the stage was. Prior to that visit, the patient did not wish to consider surgical intervention. After speaking with him, however, he did wish to be considered for surgery if he was a reasonable candidate. He was then scheduled for consultation with cardiothoracic surgery.  He was seen in consultation with Dr. Roxan Hockey on 12/10/2019 to discuss surgical intervention. At that time, it was recommended that although the node was small, it be evaluated prior to any surgical resection. Given his age, there is a high risk for morbidity and mortality. However, the patient was noted to be a potential surgical candidate given that he is active and has good pulmonary reserve with no other immediately life-threatening problems. The patient wished to discuss everything with his family prior to making any decisions.  He was seen by Dr. Roxan Hockey again on 12/24/2019, during which time he was still undecided but was more-so leaning towards radiation. If he were to proceed with surgical resection, then he would need an endobronchial ultrasound prior to that to rule out involvement of the left paratracheal node.  On review of systems, the patient  reports shortness of breath. He denies hemoptysis, pain, and any other symptoms.   Patient ultimately decided on radiation therapy for definitive treatment   Allergies:  is allergic to latex.  Meds: Current Outpatient Medications  Medication Sig Dispense Refill  . amLODipine (NORVASC) 10 MG tablet TAKE ONE TABLET BY MOUTH DAILY (Patient taking differently: Take 10 mg by mouth daily. ) 90 tablet 3  . ascorbic acid (VITAMIN C) 500 MG tablet Take 500 mg by mouth every evening.    . cholecalciferol (VITAMIN D3) 25 MCG (1000 UNIT) tablet Take 1,000 Units by mouth in the morning and at bedtime.    Marland Kitchen ELIQUIS 2.5 MG TABS tablet TAKE ONE TABLET BY MOUTH TWICE A DAY (Patient taking differently: Take 2.5 mg by mouth 2 (two) times daily. ) 60 tablet 2  . Investigational - Study Medication Take 1 tablet by mouth daily. Preventable study drug: Atorvastatin 40 mg or placebo    . magnesium hydroxide (MILK OF MAGNESIA) 400 MG/5ML suspension Take 15 mLs by mouth daily as needed for mild constipation.    . metoprolol succinate (TOPROL-XL) 50 MG 24 hr tablet TAKE ONE TABLET BY MOUTH DAILY; OFFICE VISIT NEEDED FOR MORE REFILLS (Patient taking differently: Take 50 mg by mouth daily. ) 90 tablet 3  . Psyllium (METAMUCIL PO) Take 2 capsules by mouth every evening. Take 1 tablespoonful with water every evening      No current facility-administered medications for this encounter.    Physical Findings: The patient is in no acute distress. Patient is alert and oriented.  height is 6\' 1"  (1.854 m) and weight is 194 lb 12.8 oz (88.4 kg). His oral temperature is 98.9 F (37.2 C). His blood pressure is 139/77 and his pulse is 52 (abnormal). His respiration is 18 and oxygen saturation is 100%.  No significant changes. Lungs are clear to auscultation bilaterally. Heart has regular rate and rhythm. No palpable cervical, supraclavicular, or axillary adenopathy. Abdomen soft, non-tender, normal bowel sounds.   Lab  Findings: Lab Results  Component Value Date   WBC 6.1 11/12/2019   HGB 14.7 11/12/2019   HCT 45.8 11/12/2019   MCV 97.2 11/12/2019   PLT 227 11/12/2019    Radiographic Findings: No results found.  Impression: Stage IA3 (T1c,N0, M0) vs. stage IIIA (T1c,N2, M0) squamous cell carcinoma of the left upper lobe   We reviewed the patient's PET scan in detail today.  Given the minimal activity within the mediastinal node we discussed two general options one being stereotactic body radiation therapy directed at the left upper lobe lesion.  We also discussed treating him as if he was stage IIIa disease including the left upper lobe lesion and mediastinal area along with radiosensitizing chemotherapy.  We also discussed that he may be a potential candidate for salvage radiation therapy to the mediastinum if he were to fail in this area after undergoing SBRT alone.  After thorough evaluation the patient has decided to proceed with stereotactic body radiation therapy directed at his left upper lobe lesion.  He will proceed with CT scan of the chest with contrast 3 to 4 months out from his SBRT for close follow-up of the mediastinum area.  Plan:  Patient is scheduled for CT simulation later today.  Anticipate between 3 and 5 treatments using stereotactic body radiation therapy techniques.  Total time spent in this encounter was 25 minutes which included reviewing the patient's most recent interval history, physical examination, and documentation.  -----------------------------------  Blair Promise, PhD, MD  This document serves as a record of services personally performed by Gery Pray, MD. It was created on his behalf by Clerance Lav, a trained medical scribe. The creation of this record is based on the scribe's personal observations and the provider's statements to them. This document has been checked and approved by the attending provider.

## 2020-01-13 ENCOUNTER — Ambulatory Visit
Admission: RE | Admit: 2020-01-13 | Discharge: 2020-01-13 | Disposition: A | Discharge status: Home / Self Care | Payer: Medicare PPO | Source: Ambulatory Visit | Attending: Radiation Oncology | Admitting: Radiation Oncology

## 2020-01-13 ENCOUNTER — Other Ambulatory Visit: Payer: Self-pay

## 2020-01-13 ENCOUNTER — Encounter: Payer: Self-pay | Admitting: Radiation Oncology

## 2020-01-13 VITALS — BP 139/77 | HR 52 | Temp 98.9°F | Resp 18 | Ht 73.0 in | Wt 194.8 lb

## 2020-01-13 DIAGNOSIS — C3412 Malignant neoplasm of upper lobe, left bronchus or lung: Secondary | ICD-10-CM | POA: Insufficient documentation

## 2020-01-13 DIAGNOSIS — Z7901 Long term (current) use of anticoagulants: Secondary | ICD-10-CM | POA: Diagnosis not present

## 2020-01-13 DIAGNOSIS — Z79899 Other long term (current) drug therapy: Secondary | ICD-10-CM | POA: Diagnosis not present

## 2020-01-13 DIAGNOSIS — R918 Other nonspecific abnormal finding of lung field: Secondary | ICD-10-CM

## 2020-01-16 DIAGNOSIS — C3412 Malignant neoplasm of upper lobe, left bronchus or lung: Secondary | ICD-10-CM | POA: Diagnosis not present

## 2020-01-27 NOTE — Progress Notes (Signed)
°  Radiation Oncology         [phone]) [phone] ________________________________  Name: Richard Vincent. MRN: [id number]  Date: 01/29/2020  DOB: 06-26-1932  Stereotactic Body Radiotherapy Treatment Procedure Note  NARRATIVE:  Richard Vincent. was brought to the stereotactic radiation treatment machine and placed supine on the CT couch. The patient was set up for stereotactic body radiotherapy on the body fix pillow.  3D TREATMENT PLANNING AND DOSIMETRY:  The patient's radiation plan was reviewed and approved prior to starting treatment.  It showed 3-dimensional radiation distributions overlaid onto the planning CT.  The [facility] for the target structures as well as the organs at risk were reviewed. The documentation of this is filed in the radiation oncology EMR.  SIMULATION VERIFICATION:  The patient underwent CT imaging on the treatment unit.  These were carefully aligned to document that the ablative radiation dose would cover the target volume and maximally spare the nearby organs at risk according to the planned distribution.  SPECIAL TREATMENT PROCEDURE: Richard Vincent. received high dose ablative stereotactic body radiotherapy to the planned target volume without unforeseen complications. Treatment was delivered uneventfully. The high doses associated with stereotactic body radiotherapy and the significant potential risks require careful treatment set up and patient monitoring constituting a special treatment procedure   STEREOTACTIC TREATMENT MANAGEMENT:  Following delivery, the patient was evaluated clinically. The patient tolerated treatment without significant acute effects, and was discharged to home in stable condition.    PLAN: Continue treatment as planned.  ________________________________  Blair Promise, PhD, MD  This document serves as a record of services personally performed by Gery Pray, MD. It was created on his behalf by Clerance Lav, a trained medical scribe.  The creation of this record is based on the scribe's personal observations and the provider's statements to them. This document has been checked and approved by the attending provider.

## 2020-01-29 ENCOUNTER — Ambulatory Visit
Admission: RE | Admit: 2020-01-29 | Discharge: 2020-01-29 | Disposition: A | Discharge status: Home / Self Care | Payer: Medicare PPO | Source: Ambulatory Visit | Attending: Radiation Oncology | Admitting: Radiation Oncology

## 2020-01-29 ENCOUNTER — Other Ambulatory Visit: Payer: Self-pay

## 2020-01-29 DIAGNOSIS — C3412 Malignant neoplasm of upper lobe, left bronchus or lung: Secondary | ICD-10-CM | POA: Diagnosis not present

## 2020-01-29 NOTE — Progress Notes (Signed)
  Radiation Oncology         [phone]) [phone] ________________________________  Name: Richard Vincent. MRN: [id number]  Date: 02/03/2020  DOB: 1931/08/13  Stereotactic Body Radiotherapy Treatment Procedure Note  NARRATIVE:  Richard Vincent. was brought to the stereotactic radiation treatment machine and placed supine on the CT couch. The patient was set up for stereotactic body radiotherapy on the body fix pillow.  3D TREATMENT PLANNING AND DOSIMETRY:  The patient's radiation plan was reviewed and approved prior to starting treatment.  It showed 3-dimensional radiation distributions overlaid onto the planning CT.  The [facility] for the target structures as well as the organs at risk were reviewed. The documentation of this is filed in the radiation oncology EMR.  SIMULATION VERIFICATION:  The patient underwent CT imaging on the treatment unit.  These were carefully aligned to document that the ablative radiation dose would cover the target volume and maximally spare the nearby organs at risk according to the planned distribution.  SPECIAL TREATMENT PROCEDURE: Richard Vincent. received high dose ablative stereotactic body radiotherapy to the planned target volume without unforeseen complications. Treatment was delivered uneventfully. The high doses associated with stereotactic body radiotherapy and the significant potential risks require careful treatment set up and patient monitoring constituting a special treatment procedure   STEREOTACTIC TREATMENT MANAGEMENT:  Following delivery, the patient was evaluated clinically. The patient tolerated treatment without significant acute effects, and was discharged to home in stable condition.    PLAN: Continue treatment as planned.  ________________________________  Blair Promise, PhD, MD  This document serves as a record of services personally performed by Gery Pray, MD. It was created on his behalf by Clerance Lav, a trained medical scribe.  The creation of this record is based on the scribe's personal observations and the provider's statements to them. This document has been checked and approved by the attending provider.

## 2020-01-31 ENCOUNTER — Other Ambulatory Visit: Payer: Self-pay

## 2020-01-31 ENCOUNTER — Ambulatory Visit
Admission: RE | Admit: 2020-01-31 | Discharge: 2020-01-31 | Disposition: A | Discharge status: Home / Self Care | Payer: Medicare PPO | Source: Ambulatory Visit | Attending: Radiation Oncology | Admitting: Radiation Oncology

## 2020-01-31 DIAGNOSIS — C3412 Malignant neoplasm of upper lobe, left bronchus or lung: Secondary | ICD-10-CM | POA: Diagnosis not present

## 2020-02-03 ENCOUNTER — Ambulatory Visit
Admission: RE | Admit: 2020-02-03 | Discharge: 2020-02-03 | Disposition: A | Discharge status: Home / Self Care | Payer: Medicare PPO | Source: Ambulatory Visit | Attending: Radiation Oncology | Admitting: Radiation Oncology

## 2020-02-03 ENCOUNTER — Other Ambulatory Visit: Payer: Self-pay

## 2020-02-03 DIAGNOSIS — C3412 Malignant neoplasm of upper lobe, left bronchus or lung: Secondary | ICD-10-CM | POA: Diagnosis not present

## 2020-02-04 ENCOUNTER — Ambulatory Visit: Payer: Medicare PPO | Admitting: Radiation Oncology

## 2020-02-04 NOTE — Progress Notes (Signed)
°  Radiation Oncology         [phone]) [phone] ________________________________  Name: Richard Vincent. MRN: [id number]  Date: 02/05/2020  DOB: October 16, 1931  Stereotactic Body Radiotherapy Treatment Procedure Note  NARRATIVE:  Marco Raper. was brought to the stereotactic radiation treatment machine and placed supine on the CT couch. The patient was set up for stereotactic body radiotherapy on the body fix pillow.  3D TREATMENT PLANNING AND DOSIMETRY:  The patient's radiation plan was reviewed and approved prior to starting treatment.  It showed 3-dimensional radiation distributions overlaid onto the planning CT.  The [facility] for the target structures as well as the organs at risk were reviewed. The documentation of this is filed in the radiation oncology EMR.  SIMULATION VERIFICATION:  The patient underwent CT imaging on the treatment unit.  These were carefully aligned to document that the ablative radiation dose would cover the target volume and maximally spare the nearby organs at risk according to the planned distribution.  SPECIAL TREATMENT PROCEDURE: Richard Vincent. received high dose ablative stereotactic body radiotherapy to the planned target volume without unforeseen complications. Treatment was delivered uneventfully. The high doses associated with stereotactic body radiotherapy and the significant potential risks require careful treatment set up and patient monitoring constituting a special treatment procedure   STEREOTACTIC TREATMENT MANAGEMENT:  Following delivery, the patient was evaluated clinically. The patient tolerated treatment without significant acute effects, and was discharged to home in stable condition.    PLAN: Continue treatment as planned.  ________________________________  Blair Promise, PhD, MD  This document serves as a record of services personally performed by Gery Pray, MD. It was created on his behalf by Clerance Lav, a trained medical scribe.  The creation of this record is based on the scribe's personal observations and the provider's statements to them. This document has been checked and approved by the attending provider.

## 2020-02-05 ENCOUNTER — Ambulatory Visit
Admission: RE | Admit: 2020-02-05 | Discharge: 2020-02-05 | Disposition: A | Discharge status: Home / Self Care | Payer: Medicare PPO | Source: Ambulatory Visit | Attending: Radiation Oncology | Admitting: Radiation Oncology

## 2020-02-05 ENCOUNTER — Other Ambulatory Visit: Payer: Self-pay

## 2020-02-05 DIAGNOSIS — C3412 Malignant neoplasm of upper lobe, left bronchus or lung: Secondary | ICD-10-CM | POA: Diagnosis not present

## 2020-02-07 ENCOUNTER — Ambulatory Visit
Admission: RE | Admit: 2020-02-07 | Discharge: 2020-02-07 | Disposition: A | Discharge status: Home / Self Care | Payer: Medicare PPO | Source: Ambulatory Visit | Attending: Radiation Oncology | Admitting: Radiation Oncology

## 2020-02-07 ENCOUNTER — Other Ambulatory Visit: Payer: Self-pay

## 2020-02-07 ENCOUNTER — Encounter: Payer: Self-pay | Admitting: Radiation Oncology

## 2020-02-07 DIAGNOSIS — C3412 Malignant neoplasm of upper lobe, left bronchus or lung: Secondary | ICD-10-CM | POA: Diagnosis not present

## 2020-02-24 NOTE — Progress Notes (Incomplete)
  Patient Name: Richard Vincent MRN: [id number] DOB: 06-03-1932 Referring Physician: June Leap Date of Service: 02/07/2020 [facility], [place]                                                        End Of Treatment Note  Diagnoses: C34.12-Malignant neoplasm of upper lobe, left bronchus or lung  Cancer Staging: Stage IA3 (T1c,N0, M0) vs.stage IIIA (T1c,N2, M0) squamous cell carcinoma of the left upper lobe  Intent: Curative  Radiation Treatment Dates: 01/29/2020 through 02/07/2020 Site Technique Total Dose (Gy) Dose per Fx (Gy) Completed Fx Beam Energies  Lung, Left: Lung_Lt IMRT 60/60 12 5/5 6XFFF   Narrative: The patient tolerated radiation therapy relatively well. He did report some mild fatigue and shortness of breath. He denied skin changes. His appetite remained stable.  Plan: The patient will follow-up with radiation oncology in one month.  ________________________________________________   Blair Promise, PhD, MD  This document serves as a record of services personally performed by Gery Pray, MD. It was created on his behalf by Clerance Lav, a trained medical scribe. The creation of this record is based on the scribe's personal observations and the provider's statements to them. This document has been checked and approved by the attending provider.

## 2020-03-06 ENCOUNTER — Other Ambulatory Visit: Payer: Self-pay | Admitting: Cardiology

## 2020-03-06 DIAGNOSIS — I480 Paroxysmal atrial fibrillation: Secondary | ICD-10-CM

## 2020-03-06 NOTE — Telephone Encounter (Signed)
Prescription refill request for Eliquis received. Indication: Atrial Fibrillation Last office visit: 10/16/2019 [place] Scr: 1.9 11/12/2019 Age: 86 Weight: 88.4 kg  Prescription refilled

## 2020-03-09 ENCOUNTER — Ambulatory Visit
Admission: RE | Admit: 2020-03-09 | Discharge: 2020-03-09 | Disposition: A | Discharge status: Home / Self Care | Payer: Medicare PPO | Source: Ambulatory Visit | Attending: Radiation Oncology | Admitting: Radiation Oncology

## 2020-03-09 ENCOUNTER — Encounter: Payer: Self-pay | Admitting: Radiation Oncology

## 2020-03-09 ENCOUNTER — Other Ambulatory Visit: Payer: Self-pay

## 2020-03-09 VITALS — BP 135/71 | HR 71 | Temp 98.1°F | Resp 20 | Ht 73.0 in | Wt 196.0 lb

## 2020-03-09 DIAGNOSIS — Z923 Personal history of irradiation: Secondary | ICD-10-CM | POA: Diagnosis not present

## 2020-03-09 DIAGNOSIS — R0602 Shortness of breath: Secondary | ICD-10-CM | POA: Insufficient documentation

## 2020-03-09 DIAGNOSIS — R5383 Other fatigue: Secondary | ICD-10-CM | POA: Diagnosis not present

## 2020-03-09 DIAGNOSIS — Z7901 Long term (current) use of anticoagulants: Secondary | ICD-10-CM | POA: Insufficient documentation

## 2020-03-09 DIAGNOSIS — C3412 Malignant neoplasm of upper lobe, left bronchus or lung: Secondary | ICD-10-CM | POA: Diagnosis not present

## 2020-03-09 DIAGNOSIS — Z79899 Other long term (current) drug therapy: Secondary | ICD-10-CM | POA: Insufficient documentation

## 2020-03-09 NOTE — Progress Notes (Signed)
Patient here today for a follow-up visit. Patient denies having any pain. Patient states mild fatigue. Patient states having a mild productive cough that is clear. Patient denies hemoptysis. Patient states having mild shortness of breath when walking. Patient denies painful or difficult swallowing. Patient states that he is not currently on chemotherapy treatments. Patient states no skin changes.  BP 135/71 (BP Location: Right Arm, Patient Position: Bed low/side rails up, Cuff Size: Small)   Pulse 71   Temp 98.1 F (36.7 C)   Resp 20   Ht 6\' 1"  (1.854 m)   Wt 196 lb (88.9 kg)   SpO2 100%   BMI 25.86 kg/m

## 2020-03-09 NOTE — Progress Notes (Signed)
Radiation Oncology         [phone]) [phone] ________________________________  Name: Richard Vincent. MRN: [id number]  Date: 03/09/2020  DOB: [birth year]  Follow-Up Visit Note  CC: Patient, No Pcp Per  June Leap L, DO    ICD-10-CM   1. Primary cancer of left upper lobe of lung ([facility])  C34.12 CT Chest Wo Contrast    Diagnosis: Stage IA3 (T1c,N0, M0)  squamous cell carcinoma of the left upper lobe  Interval Since Last Radiation: One month.  Radiation Treatment Dates: 01/29/2020 through 02/07/2020 Site Technique Total Dose (Gy) Dose per Fx (Gy) Completed Fx Beam Energies  Lung, Left: Lung_Lt IMRT 60/60 12 5/5 6XFFF    Narrative:  The patient returns today for routine follow-up. No significant interval history since the end of treatment.   On review of systems, he reports mild fatigue, mild shortness of breath when ambulating, and a mildly productive cough with clear sputum. He denies skin changes, chest pain, hemoptysis, pain with swallowing, and difficulty swallowing.               ALLERGIES:  is allergic to latex.  Meds: Current Outpatient Medications  Medication Sig Dispense Refill  . amLODipine (NORVASC) 10 MG tablet TAKE ONE TABLET BY MOUTH DAILY (Patient taking differently: Take 10 mg by mouth daily. ) 90 tablet 3  . apixaban (ELIQUIS) 2.5 MG TABS tablet Take 1 tablet (2.5 mg total) by mouth 2 (two) times daily. 60 tablet 5  . ascorbic acid (VITAMIN C) 500 MG tablet Take 500 mg by mouth every evening.    . cholecalciferol (VITAMIN D3) 25 MCG (1000 UNIT) tablet Take 1,000 Units by mouth in the morning and at bedtime.    . Investigational - Study Medication Take 1 tablet by mouth daily. Preventable study drug: Atorvastatin 40 mg or placebo    . magnesium hydroxide (MILK OF MAGNESIA) 400 MG/5ML suspension Take 15 mLs by mouth daily as needed for mild constipation.    . metoprolol succinate (TOPROL-XL) 50 MG 24 hr tablet TAKE ONE TABLET BY MOUTH DAILY; OFFICE VISIT NEEDED FOR  MORE REFILLS (Patient taking differently: Take 50 mg by mouth daily. ) 90 tablet 3  . Psyllium (METAMUCIL PO) Take 2 capsules by mouth every evening. Take 1 tablespoonful with water every evening      No current facility-administered medications for this encounter.    Physical Findings: The patient is in no acute distress. Patient is alert and oriented.  height is 6\' 1"  (1.854 m) and weight is 196 lb (88.9 kg). His temperature is 98.1 F (36.7 C). His blood pressure is 135/71 and his pulse is 71. His respiration is 20 and oxygen saturation is 100%.  No significant changes. Lungs are clear to auscultation bilaterally. Heart has regular rate and rhythm. No palpable cervical, supraclavicular, or axillary adenopathy. Abdomen soft, non-tender, normal bowel sounds.   Lab Findings: Lab Results  Component Value Date   WBC 6.1 11/12/2019   HGB 14.7 11/12/2019   HCT 45.8 11/12/2019   MCV 97.2 11/12/2019   PLT 227 11/12/2019    Radiographic Findings: No results found.  Impression: Stage IA3 (T1c,N0, M0)  squamous cell carcinoma of the left upper lobe  The patient is recovering from the effects of radiation.  Minimal side effects at this time.  Plan: The patient will follow-up with radiation oncology in three months, prior to which he will undergo a chest CT scan.    ____________________________________   Blair Promise, PhD,  MD  This document serves as a record of services personally performed by Gery Pray, MD. It was created on his behalf by Clerance Lav, a trained medical scribe. The creation of this record is based on the scribe's personal observations and the provider's statements to them. This document has been checked and approved by the attending provider.

## 2020-04-15 ENCOUNTER — Encounter (HOSPITAL_BASED_OUTPATIENT_CLINIC_OR_DEPARTMENT_OTHER): Payer: Self-pay | Admitting: *Deleted

## 2020-04-15 ENCOUNTER — Emergency Department (HOSPITAL_BASED_OUTPATIENT_CLINIC_OR_DEPARTMENT_OTHER): Payer: Medicare PPO

## 2020-04-15 ENCOUNTER — Other Ambulatory Visit: Payer: Self-pay

## 2020-04-15 ENCOUNTER — Emergency Department (HOSPITAL_BASED_OUTPATIENT_CLINIC_OR_DEPARTMENT_OTHER)
Admission: EM | Admit: 2020-04-15 | Discharge: 2020-04-15 | Disposition: A | Discharge status: Home / Self Care | Payer: Medicare PPO | Attending: Emergency Medicine | Admitting: Emergency Medicine

## 2020-04-15 DIAGNOSIS — Z5321 Procedure and treatment not carried out due to patient leaving prior to being seen by health care provider: Secondary | ICD-10-CM | POA: Insufficient documentation

## 2020-04-15 DIAGNOSIS — Z20822 Contact with and (suspected) exposure to covid-19: Secondary | ICD-10-CM | POA: Diagnosis not present

## 2020-04-15 DIAGNOSIS — R509 Fever, unspecified: Secondary | ICD-10-CM | POA: Diagnosis not present

## 2020-04-15 LAB — RESPIRATORY PANEL BY RT PCR (FLU A&B, COVID)
Influenza A by PCR: NEGATIVE
Influenza B by PCR: NEGATIVE
SARS Coronavirus 2 by RT PCR: NEGATIVE

## 2020-04-15 NOTE — ED Notes (Signed)
Registration staff states patient told her that he was going home to eat and would return tomorrow.

## 2020-04-15 NOTE — ED Triage Notes (Signed)
C/o fever x 3 days

## 2020-04-17 ENCOUNTER — Other Ambulatory Visit: Payer: Self-pay

## 2020-04-17 ENCOUNTER — Emergency Department (HOSPITAL_BASED_OUTPATIENT_CLINIC_OR_DEPARTMENT_OTHER)
Admission: EM | Admit: 2020-04-17 | Discharge: 2020-04-17 | Disposition: A | Discharge status: Home / Self Care | Payer: Medicare PPO

## 2020-04-17 ENCOUNTER — Encounter (HOSPITAL_BASED_OUTPATIENT_CLINIC_OR_DEPARTMENT_OTHER): Payer: Self-pay | Admitting: Emergency Medicine

## 2020-04-17 ENCOUNTER — Other Ambulatory Visit (HOSPITAL_BASED_OUTPATIENT_CLINIC_OR_DEPARTMENT_OTHER): Payer: Self-pay | Admitting: Emergency Medicine

## 2020-04-17 ENCOUNTER — Emergency Department (HOSPITAL_BASED_OUTPATIENT_CLINIC_OR_DEPARTMENT_OTHER)
Admission: EM | Admit: 2020-04-17 | Discharge: 2020-04-17 | Disposition: A | Discharge status: Home / Self Care | Payer: Medicare PPO | Attending: Emergency Medicine | Admitting: Emergency Medicine

## 2020-04-17 ENCOUNTER — Emergency Department (HOSPITAL_BASED_OUTPATIENT_CLINIC_OR_DEPARTMENT_OTHER): Payer: Medicare PPO

## 2020-04-17 DIAGNOSIS — I10 Essential (primary) hypertension: Secondary | ICD-10-CM | POA: Insufficient documentation

## 2020-04-17 DIAGNOSIS — Z91040 Latex allergy status: Secondary | ICD-10-CM | POA: Diagnosis not present

## 2020-04-17 DIAGNOSIS — Z7901 Long term (current) use of anticoagulants: Secondary | ICD-10-CM | POA: Insufficient documentation

## 2020-04-17 DIAGNOSIS — R197 Diarrhea, unspecified: Secondary | ICD-10-CM | POA: Insufficient documentation

## 2020-04-17 DIAGNOSIS — Z79899 Other long term (current) drug therapy: Secondary | ICD-10-CM | POA: Insufficient documentation

## 2020-04-17 DIAGNOSIS — Z87891 Personal history of nicotine dependence: Secondary | ICD-10-CM | POA: Insufficient documentation

## 2020-04-17 DIAGNOSIS — R509 Fever, unspecified: Secondary | ICD-10-CM | POA: Diagnosis present

## 2020-04-17 DIAGNOSIS — Z20822 Contact with and (suspected) exposure to covid-19: Secondary | ICD-10-CM | POA: Insufficient documentation

## 2020-04-17 DIAGNOSIS — J189 Pneumonia, unspecified organism: Secondary | ICD-10-CM

## 2020-04-17 DIAGNOSIS — J181 Lobar pneumonia, unspecified organism: Secondary | ICD-10-CM | POA: Insufficient documentation

## 2020-04-17 DIAGNOSIS — R059 Cough, unspecified: Secondary | ICD-10-CM

## 2020-04-17 DIAGNOSIS — J45909 Unspecified asthma, uncomplicated: Secondary | ICD-10-CM | POA: Diagnosis not present

## 2020-04-17 LAB — CBC WITH DIFFERENTIAL/PLATELET
Abs Immature Granulocytes: 0.03 10*3/uL (ref 0.00–0.07)
Basophils Absolute: 0 10*3/uL (ref 0.0–0.1)
Basophils Relative: 0 %
Eosinophils Absolute: 0 10*3/uL (ref 0.0–0.5)
Eosinophils Relative: 0 %
HCT: 41.6 % (ref 39.0–52.0)
Hemoglobin: 13.5 g/dL (ref 13.0–17.0)
Immature Granulocytes: 0 %
Lymphocytes Relative: 6 %
Lymphs Abs: 0.4 10*3/uL — ABNORMAL LOW (ref 0.7–4.0)
MCH: 31.3 pg (ref 26.0–34.0)
MCHC: 32.5 g/dL (ref 30.0–36.0)
MCV: 96.5 fL (ref 80.0–100.0)
Monocytes Absolute: 1 10*3/uL (ref 0.1–1.0)
Monocytes Relative: 14 %
Neutro Abs: 5.9 10*3/uL (ref 1.7–7.7)
Neutrophils Relative %: 80 %
Platelets: 142 10*3/uL — ABNORMAL LOW (ref 150–400)
RBC: 4.31 MIL/uL (ref 4.22–5.81)
RDW: 12.7 % (ref 11.5–15.5)
WBC: 7.4 10*3/uL (ref 4.0–10.5)
nRBC: 0 % (ref 0.0–0.2)

## 2020-04-17 LAB — COMPREHENSIVE METABOLIC PANEL
ALT: 10 U/L (ref 0–44)
AST: 23 U/L (ref 15–41)
Albumin: 3.4 g/dL — ABNORMAL LOW (ref 3.5–5.0)
Alkaline Phosphatase: 59 U/L (ref 38–126)
Anion gap: 12 (ref 5–15)
BUN: 29 mg/dL — ABNORMAL HIGH (ref 8–23)
CO2: 23 mmol/L (ref 22–32)
Calcium: 8.7 mg/dL — ABNORMAL LOW (ref 8.9–10.3)
Chloride: 101 mmol/L (ref 98–111)
Creatinine, Ser: 2.19 mg/dL — ABNORMAL HIGH (ref 0.61–1.24)
GFR calc Af Amer: 30 mL/min — ABNORMAL LOW (ref >60)
GFR calc non Af Amer: 26 mL/min — ABNORMAL LOW (ref >60)
Glucose, Bld: 120 mg/dL — ABNORMAL HIGH (ref 70–99)
Potassium: 5.1 mmol/L (ref 3.5–5.1)
Sodium: 136 mmol/L (ref 135–145)
Total Bilirubin: 0.8 mg/dL (ref 0.3–1.2)
Total Protein: 7.2 g/dL (ref 6.5–8.1)

## 2020-04-17 LAB — RESPIRATORY PANEL BY RT PCR (FLU A&B, COVID)
Influenza A by PCR: NEGATIVE
Influenza B by PCR: NEGATIVE
SARS Coronavirus 2 by RT PCR: NEGATIVE

## 2020-04-17 LAB — LACTIC ACID, PLASMA: Lactic Acid, Venous: 1.5 mmol/L (ref 0.5–1.9)

## 2020-04-17 MED ORDER — SODIUM CHLORIDE 0.9 % IV SOLN
1.0000 g | Freq: Once | INTRAVENOUS | Status: AC
  Administered 2020-04-17: 1 g via INTRAVENOUS
  Filled 2020-04-17: qty 10

## 2020-04-17 MED ORDER — SODIUM CHLORIDE 0.9 % IV SOLN
500.0000 mg | Freq: Once | INTRAVENOUS | Status: AC
  Administered 2020-04-17: 500 mg via INTRAVENOUS
  Filled 2020-04-17: qty 500

## 2020-04-17 MED ORDER — SODIUM CHLORIDE 0.9 % IV BOLUS
500.0000 mL | Freq: Once | INTRAVENOUS | Status: AC
  Administered 2020-04-17: 500 mL via INTRAVENOUS

## 2020-04-17 MED ORDER — ACETAMINOPHEN 325 MG PO TABS
650.0000 mg | ORAL_TABLET | Freq: Once | ORAL | Status: AC
  Administered 2020-04-17: 650 mg via ORAL
  Filled 2020-04-17: qty 2

## 2020-04-17 MED ORDER — SODIUM CHLORIDE 0.9 % IV SOLN
INTRAVENOUS | Status: DC | PRN
  Administered 2020-04-17: 250 mL via INTRAVENOUS

## 2020-04-17 MED ORDER — AZITHROMYCIN 250 MG PO TABS
250.0000 mg | ORAL_TABLET | Freq: Every day | ORAL | 0 refills | Status: DC
Start: 2020-04-17 — End: 2020-04-23

## 2020-04-17 MED FILL — AZITHROMYCIN 250 MG TABS: 250 | 4 days supply | Qty: 4 | Fill #0

## 2020-04-17 NOTE — Discharge Instructions (Signed)
Follow-up with your pulmonologist for an appointment next week for recheck.  You will need a repeat chest x-ray to make sure you are chest x-ray findings are improving.  Return here as needed if you have any worsening symptoms including worsening shortness of breath, chest pain, vomiting or other worsening symptoms.

## 2020-04-17 NOTE — ED Provider Notes (Addendum)
[facility] EMERGENCY DEPARTMENT Provider Note   CSN: [id number] Arrival date & time: 04/17/20  2353     History No chief complaint on file.   Richard Vincent. is a 86 y.o. male.  Patient is a 86 year old male with a history of BPH, GERD, hypertension, paroxysmal atrial fibrillation on Eliquis and squamous cell carcinoma of the lung, currently undergoing radiation therapy who presents with fever and chills.  He reports a 1 week history of fever and chills.  He does not know what his temperature has been as he has not checked it.  He says sometimes he gets better with Tylenol and other times it does not.  He feels mild fatigue.  He has a mild cough but attributes that to his lung cancer.  He has had one episode of diarrhea this morning but no other diarrhea.  No nausea or vomiting.  No abdominal pain.  No skin lesions or wounds.  He checked into the ED 2 days ago and had a negative Covid test but left from the lobby prior to evaluation.  He is fully vaccinated for Covid.  He denies any urinary symptoms.        Past Medical History:  Diagnosis Date  . Asthma    childhood   . Basal cell cancer    nose  . BPH (benign prostatic hyperplasia)   . GERD (gastroesophageal reflux disease)    occ  . Gout    changed diet years ago ; no gout attacks since   . Hypertension   . Paroxysmal A-fib (HCC)    occurred post-operatively ; last EKG he was in NSR   . Renal insufficiency    non problematic     Patient Active Problem List   Diagnosis Date Noted  . Primary cancer of left upper lobe of lung ([place]) 11/21/2019  . Lung mass 11/12/2019  . BPH with obstruction/lower urinary tract symptoms 12/14/2017  . Palpitations 01/04/2012  . Hypertension 01/04/2012  . Dyspnea 01/04/2012    Past Surgical History:  Procedure Laterality Date  . BRONCHIAL BIOPSY  11/12/2019   Procedure: BRONCHIAL BIOPSIES;  Surgeon: [name], DO;  Location: [facility];  Service: Pulmonary;;  .  BRONCHIAL BRUSHINGS  11/12/2019   Procedure: BRONCHIAL BRUSHINGS;  Surgeon: [name], DO;  Location: [facility];  Service: Pulmonary;;  . BRONCHIAL WASHINGS  11/12/2019   Procedure: BRONCHIAL WASHINGS;  Surgeon: [name], DO;  Location: [facility];  Service: Pulmonary;;  . ENDOBRONCHIAL ULTRASOUND  11/12/2019   Procedure: ENDOBRONCHIAL ULTRASOUND;  Surgeon: [name], DO;  Location: [facility];  Service: Pulmonary;;  . EYE SURGERY  about 5-6 years ago    cataracts bilateral   . FIDUCIAL MARKER PLACEMENT  11/12/2019   Procedure: FIDUCIAL MARKER PLACEMENT;  Surgeon: [name], DO;  Location: [facility];  Service: Pulmonary;;  . FINE NEEDLE ASPIRATION  11/12/2019   Procedure: FINE NEEDLE ASPIRATION (FNA) LINEAR;  Surgeon: [name], DO;  Location: [facility];  Service: Pulmonary;;  . NOSE SURGERY    . Skin cancer removed    . TONSILLECTOMY    . TRANSURETHRAL RESECTION OF PROSTATE    . VIDEO BRONCHOSCOPY WITH ENDOBRONCHIAL NAVIGATION N/A 11/12/2019   Procedure: VIDEO BRONCHOSCOPY WITH ENDOBRONCHIAL NAVIGATION;  Surgeon: [name], DO;  Location: [facility];  Service: Pulmonary;  Laterality: N/A;  . XI ROBOTIC ASSISTED SIMPLE PROSTATECTOMY N/A 12/14/2017   Procedure: XI ROBOTIC ASSISTED SIMPLE PROSTATECTOMY;  Surgeon: Nicolette Bang  L, MD;  Location: [facility];  Service: Urology;  Laterality: N/A;       Family History  Problem Relation Age of Onset  . Coronary artery disease Other        No family history    Social History   Tobacco Use  . Smoking status: Former Smoker    Packs/day: 1.00    Years: 30.00    Pack years: 30.00    Types: Cigarettes    Quit date: 10/17/1990    Years since quitting: 29.5  . Smokeless tobacco: Never Used  Vaping Use  . Vaping Use: Never used  Substance Use Topics  . Alcohol use: Yes    Alcohol/week: 1.0 - 2.0 standard drink    Types: 1 - 2 Glasses of wine per week    Comment: every other day  . Drug  use: No    Home Medications Prior to Admission medications   Medication Sig Start Date End Date Taking? Authorizing Provider  amLODipine (NORVASC) 10 MG tablet TAKE ONE TABLET BY MOUTH DAILY Patient taking differently: Take 10 mg by mouth daily.  07/09/19  Yes Lelon Perla, MD  apixaban (ELIQUIS) 2.5 MG TABS tablet Take 1 tablet (2.5 mg total) by mouth 2 (two) times daily. 03/06/20  Yes Lelon Perla, MD  metoprolol succinate (TOPROL-XL) 50 MG 24 hr tablet TAKE ONE TABLET BY MOUTH DAILY; OFFICE VISIT NEEDED FOR MORE REFILLS Patient taking differently: Take 50 mg by mouth daily.  10/21/19  Yes Lelon Perla, MD  ascorbic acid (VITAMIN C) 500 MG tablet Take 500 mg by mouth every evening.    [provider]  azithromycin (ZITHROMAX) 250 MG tablet Take 1 tablet (250 mg total) by mouth daily. Start on 04/18/20 04/17/20   Malvin Johns, MD  cholecalciferol (VITAMIN D3) 25 MCG (1000 UNIT) tablet Take 1,000 Units by mouth in the morning and at bedtime.    [provider]  Investigational - Study Medication Take 1 tablet by mouth daily. Preventable study drug: Atorvastatin 40 mg or placebo    [provider]  magnesium hydroxide (MILK OF MAGNESIA) 400 MG/5ML suspension Take 15 mLs by mouth daily as needed for mild constipation.    [provider]  Psyllium (METAMUCIL PO) Take 2 capsules by mouth every evening. Take 1 tablespoonful with water every evening     [provider]    Allergies    Latex  Review of Systems   Review of Systems  Constitutional: Positive for chills, fatigue and fever. Negative for diaphoresis.  HENT: Negative for congestion, rhinorrhea and sneezing.   Eyes: Negative.   Respiratory: Positive for cough. Negative for chest tightness and shortness of breath.   Cardiovascular: Negative for chest pain and leg swelling.  Gastrointestinal: Positive for diarrhea. Negative for abdominal pain, blood in stool, nausea and vomiting.    Genitourinary: Negative for difficulty urinating, flank pain, frequency and hematuria.  Musculoskeletal: Negative for arthralgias and back pain.  Skin: Negative for rash.  Neurological: Negative for dizziness, speech difficulty, weakness, numbness and headaches.    Physical Exam Updated Vital Signs BP (!) 161/81   Pulse 94   Temp 98.3 F (36.8 C) (Oral)   Resp (!) 24   Ht 6' (1.829 m)   Wt 86.9 kg   SpO2 99%   BMI 25.98 kg/m   Physical Exam Constitutional:      Appearance: He is well-developed.  HENT:     Head: Normocephalic and atraumatic.  Eyes:  Pupils: Pupils are equal, round, and reactive to light.  Cardiovascular:     Rate and Rhythm: Normal rate and regular rhythm.     Heart sounds: Normal heart sounds.  Pulmonary:     Effort: Pulmonary effort is normal. No respiratory distress.     Breath sounds: Normal breath sounds. No wheezing or rales.  Chest:     Chest wall: No tenderness.  Abdominal:     General: Bowel sounds are normal.     Palpations: Abdomen is soft.     Tenderness: There is no abdominal tenderness. There is no guarding or rebound.  Musculoskeletal:        General: Normal range of motion.     Cervical back: Normal range of motion and neck supple.  Lymphadenopathy:     Cervical: No cervical adenopathy.  Skin:    General: Skin is warm and dry.     Findings: No rash.  Neurological:     Mental Status: He is alert and oriented to person, place, and time.     ED Results / Procedures / Treatments   Labs (all labs ordered are listed, but only abnormal results are displayed) Labs Reviewed  COMPREHENSIVE METABOLIC PANEL - Abnormal; Notable for the following components:      Result Value   Glucose, Bld 120 (*)    BUN 29 (*)    Creatinine, Ser 2.19 (*)    Calcium 8.7 (*)    Albumin 3.4 (*)    GFR calc non Af Amer 26 (*)    GFR calc Af Amer 30 (*)    All other components within normal limits  CBC WITH DIFFERENTIAL/PLATELET - Abnormal; Notable  for the following components:   Platelets 142 (*)    Lymphs Abs 0.4 (*)    All other components within normal limits  CULTURE, BLOOD (ROUTINE X 2)  RESPIRATORY PANEL BY RT PCR (FLU A&B, COVID)  CULTURE, BLOOD (ROUTINE X 2)  LACTIC ACID, PLASMA    EKG None  Radiology DG Chest Port 1 View  Result Date: 04/17/2020 CLINICAL DATA:  Fever, chills. EXAM: PORTABLE CHEST 1 VIEW COMPARISON:  April 15, 2020. FINDINGS: Stable cardiomediastinal silhouette. No pneumothorax is noted. Right lung is clear. Increased peripheral opacity seen in left midlung most consistent with worsening pneumonia. Surgical clips are seen over the left midlung. Small left pleural effusion is noted. Bony thorax is unremarkable. IMPRESSION: Worsening left midlung pneumonia.  Small left pleural effusion. Electronically Signed   By: Marijo Conception M.D.   On: 04/17/2020 08:35   DG Chest Portable 1 View  Result Date: 04/15/2020 CLINICAL DATA:  Cough, fevers, chills. History of left lung cancer, radiation treatment 7/21. EXAM: PORTABLE CHEST 1 VIEW.  Patient is rotated. COMPARISON:  Chest x-ray 11/12/2019, CT chest 11/06/2019 FINDINGS: Surgical clips overlie the left mid lung zone. The heart size and mediastinal contours are within normal limits. Aortic arch calcifications. Interval development of a peripheral hazy, somewhat wedge-shaped, airspace opacity overlying the left upper lobe. No pulmonary edema. Persistent blunting of left costophrenic angle. No definite pleural effusion. No pneumothorax. No acute osseous abnormality. IMPRESSION: Interval development of a peripheral, slightly wedge-shaped, hazy airspace opacity overlying the left upper lobe. Finding may represent infection, inflammation, or even pulmonary infarction. Consider CT angiography chest for further evaluation of possible underlying pulmonary embolus versus infection. These results were called by telephone at the time of interpretation on 04/15/2020 at 5:16 pm to  provider DAVID YAO , who verbally acknowledged these results.  Electronically Signed   By: Iven Finn M.D.   On: 04/15/2020 17:18    Procedures Procedures (including critical care time)  Medications Ordered in ED Medications  0.9 %  sodium chloride infusion ( Intravenous Stopped 04/17/20 1325)  cefTRIAXone (ROCEPHIN) 1 g in sodium chloride 0.9 % 100 mL IVPB (0 g Intravenous Stopped 04/17/20 1100)  azithromycin (ZITHROMAX) 500 mg in sodium chloride 0.9 % 250 mL IVPB (0 mg Intravenous Stopped 04/17/20 1253)  sodium chloride 0.9 % bolus 500 mL (0 mLs Intravenous Stopped 04/17/20 1130)  acetaminophen (TYLENOL) tablet 650 mg (650 mg Oral Given 04/17/20 1021)    ED Course  I have reviewed the triage vital signs and the nursing notes.  Pertinent labs & imaging results that were available during my care of the patient were reviewed by me and considered in my medical decision making (see chart for details).    MDM Rules/Calculators/A&P                          Patient is a 86 year old male who presents with fever and chills for the last 5 days.  He also has a mild cough.  He has very minimal associated shortness of breath.  No pleuritic chest pain.  No leg pain or swelling.  His chest x-ray shows left lobe pneumonia.  He had a chest x-ray 2 days ago when he was in triage but left prior to evaluation.  It appears to be a worsening pneumonia.  The initial chest x-ray and recommended a CT the chest to rule out underlying pathology were p.m.  However given his fever and chills and worsening pneumonia symptoms and x-ray, his symptoms seem mostly consistent with pneumonia.  He was ambulated and he had no hypoxia.  His heart rate on ambulation when up to the low 100s but he did not report any shortness of breath or dizziness.  His creatinine would not allow for a CT of the chest today.  And given his symptoms are more consistent with pneumonia, I felt that this was not needed currently.  His creatinine was  slightly higher than his prior values.  I did discuss with the patient and he was given some IV fluids.  He was given IV Rocephin and Zithromax in the ED.  He request to go home and I feel this is appropriate at this point.  I discussed that he will need close outpatient follow-up.  He currently does not have a PCP.  He is seeing a radiation oncologist for his radiation treatments but he does have a pulmonologist.  Instructed him to follow-up with his pulmonologist within the next few days for recheck.  I advised him he will need a repeat chest x-ray to make sure that the pneumonia is clearing and that there is no underlying pathology.  You also need a repeat creatinine at some point.  He was given strict return precautions.  He was given a prescription for 4-day remainder course of Zithromax. Final Clinical Impression(s) / ED Diagnoses Final diagnoses:  Community acquired pneumonia of left upper lobe of lung    Rx / DC Orders ED Discharge Orders         Ordered    azithromycin (ZITHROMAX) 250 MG tablet  Daily        04/17/20 1442           Malvin Johns, MD 04/17/20 1447    Malvin Johns, MD 04/17/20 1541

## 2020-04-17 NOTE — ED Triage Notes (Addendum)
States, is having chills and fever, x 5 days. Was eval at [facility] x 2 days ago, COVID neg on 9/29

## 2020-04-17 NOTE — ED Notes (Signed)
Unable to provide u/a at present.

## 2020-04-17 NOTE — ED Triage Notes (Signed)
Pt not in lobby, unable to locate patient for triage.

## 2020-04-19 ENCOUNTER — Telehealth (HOSPITAL_BASED_OUTPATIENT_CLINIC_OR_DEPARTMENT_OTHER): Payer: Self-pay | Admitting: *Deleted

## 2020-04-19 ENCOUNTER — Telehealth (HOSPITAL_BASED_OUTPATIENT_CLINIC_OR_DEPARTMENT_OTHER): Payer: Self-pay | Admitting: Emergency Medicine

## 2020-04-19 LAB — BLOOD CULTURE ID PANEL (REFLEXED) - BCID2

## 2020-04-19 LAB — CULTURE, BLOOD (ROUTINE X 2)

## 2020-04-19 NOTE — Telephone Encounter (Signed)
Micro lab called with a positive blood culture. Dr. Florina Ou reviewed the chart and felt this could be a contaminant. He requested I call the patient to see how he is doing and if he was not feeling better to return to the ED for repeat blood cultures. Attempt to call pt. Voicemail left.

## 2020-04-21 ENCOUNTER — Telehealth: Payer: Self-pay | Admitting: *Deleted

## 2020-04-21 NOTE — Telephone Encounter (Signed)
Post ED Visit - Positive Culture Follow-up  Culture report reviewed by antimicrobial stewardship pharmacist: [facility] Team []  Elenor Quinones, Pharm.D. []  Heide Guile, Pharm.D., BCPS AQ-ID []  Parks Neptune, Pharm.D., BCPS []  Alycia Rossetti, Pharm.D., BCPS []  [place], Pharm.D., BCPS, AAHIVP []  Legrand Como, Pharm.D., BCPS, AAHIVP []  Salome Arnt, PharmD, BCPS []  Johnnette Gourd, PharmD, BCPS []  Hughes Better, PharmD, BCPS []  Leeroy Cha, PharmD []  Laqueta Linden, PharmD, BCPS []  Albertina Parr, PharmD  [place] Team []  Leodis Sias, PharmD []  Lindell Spar, PharmD []  Royetta Asal, PharmD []  Graylin Shiver, Rph []  Rema Fendt) Glennon Mac, PharmD []  Arlyn Dunning, PharmD []  Netta Cedars, PharmD []  Dia Sitter, PharmD []  Leone Haven, PharmD []  Gretta Arab, PharmD []  Theodis Shove, PharmD []  Peggyann Juba, PharmD []  Reuel Boom, PharmD   Positive blood culture Possible contaminant, F/U outpatient and no further patient follow-up is required at this time. Dimple Nanas, PharmD  Harlon Flor Talley 04/21/2020, 9:52 AM

## 2020-04-22 LAB — CULTURE, BLOOD (ROUTINE X 2)
Culture: NO GROWTH
Special Requests: ADEQUATE

## 2020-04-23 ENCOUNTER — Ambulatory Visit: Payer: Medicare PPO | Admitting: Pulmonary Disease

## 2020-04-23 ENCOUNTER — Encounter: Payer: Self-pay | Admitting: Pulmonary Disease

## 2020-04-23 ENCOUNTER — Ambulatory Visit (INDEPENDENT_AMBULATORY_CARE_PROVIDER_SITE_OTHER): Payer: Medicare PPO

## 2020-04-23 ENCOUNTER — Other Ambulatory Visit: Payer: Self-pay

## 2020-04-23 VITALS — BP 120/62 | HR 67 | Temp 97.3°F | Ht 73.0 in | Wt 191.0 lb

## 2020-04-23 DIAGNOSIS — C3412 Malignant neoplasm of upper lobe, left bronchus or lung: Secondary | ICD-10-CM | POA: Diagnosis not present

## 2020-04-23 DIAGNOSIS — J189 Pneumonia, unspecified organism: Secondary | ICD-10-CM

## 2020-04-23 DIAGNOSIS — J439 Emphysema, unspecified: Secondary | ICD-10-CM | POA: Diagnosis not present

## 2020-04-23 DIAGNOSIS — J449 Chronic obstructive pulmonary disease, unspecified: Secondary | ICD-10-CM | POA: Insufficient documentation

## 2020-04-23 NOTE — Patient Instructions (Addendum)
You were seen today by Lauraine Rinne, NP  for:   1. Community acquired pneumonia of left lower lobe of lung  - DG Chest 2 View; Future  We reviewed your chest x-ray today.  Is showing improvement from the previous chest x-ray from the emergency room.  This is good news  Notify our office if you have fevers, loss of appetite, increased fatigue, increased cough and congestion  2. Primary cancer of left upper lobe of lung ([place])  Keep upcoming appointment with radiation oncology in November/2021  Complete CT imaging as outlined with radiation oncology  3. Pulmonary emphysema, unspecified emphysema type ([place])  We reviewed your breathing test today  You have mild COPD  We will continue to monitor your breathing.  If you notice your breathing is worsening please let us know  We will block you at the next office visit to assess oxygen levels  We recommend today:  Orders Placed This Encounter  Procedures  . DG Chest 2 View    Standing Status:   Future    Number of Occurrences:   1    Standing Expiration Date:   04/23/2021    Order Specific Question:   Reason for Exam (SYMPTOM  OR DIAGNOSIS REQUIRED)    Answer:   Pneumonia f/u    Order Specific Question:   Preferred imaging location?    Answer:   Internal   Orders Placed This Encounter  Procedures  . DG Chest 2 View   No orders of the defined types were placed in this encounter.   Follow Up:    Return in about 3 months (around 07/24/2020), or if symptoms worsen or fail to improve, for Follow up with Dr. Valeta Harms.   Notification of test results are managed in the following manner: If there are  any recommendations or changes to the  plan of care discussed in office today,  we will contact you and let you know what they are. If you do not hear from [place], then your results are normal and you can view them through your  MyChart account , or a letter will be sent to you. Thank you again for trusting [place] with your care  - Thank you, [facility]    It is flu season:   >>> Best ways to protect herself from the flu: Receive the yearly flu vaccine, practice good hand hygiene washing with soap and also using hand sanitizer when available, eat a nutritious meals, get adequate rest, hydrate appropriately       Please contact the office if your symptoms worsen or you have concerns that you are not improving.   Thank you for choosing [facility] for your healthcare, and for allowing [place] to partner with you on your healthcare journey. I am thankful to be able to provide care to you today.   Wyn Quaker FNP-C

## 2020-04-23 NOTE — Progress Notes (Signed)
@Patient  ID: Richard Vincent., male    DOB: [birth year], 86 y.o.   MRN: [id number]  Chief Complaint  Patient presents with  . Hospitalization Follow-up    CAP of left lower lung. doing better    Referring provider: No ref. provider found  HPI:  86 year old male former smoker followed in our office for lung mass in the left upper lobe  PMH: Palpitations, hypertension, BPH Smoker/ Smoking History: Former smoker. Quit 1992. 30-pack-year smoker. Maintenance:  None Pt of: Dr. Valeta Harms  04/24/2020  - Visit   86 year old male former smoker followed in our office by Dr. Valeta Harms. Last seen in our office in May/2021 by Dr. Valeta Harms. It is recommended that time that he obtain pulmonary function testing have a referral to cardiothoracic surgery. He has since been established with radiation oncology. Patient's last appointment with Dr. Sondra Come was on 03/09/2020 assessment and plan from that office visit is listed below:  Impression: Stage IA3 (T1c,N0, M0)  squamous cell carcinoma of the left upper lobe  The patient is recovering from the effects of radiation.  Minimal side effects at this time.  Plan: The patient will follow-up with radiation oncology in three months, prior to which he will undergo a chest CT scan.  Since being seen he presented to the emergency room on 04/17/2020 and was diagnosed with community-acquired pneumonia of the left upper lobe. He was treated with azithromycin. As well as IV Rocephin. He was instructed to have follow-up with our office within a few days with a repeat chest x-ray. He presented to our office and completed this.  Chest x-ray results are listed below:  04/23/2020-chest x-ray-partial but incomplete clearing of the infiltrate from the left midlung, near is mild left base atelectasis with small left pleural effusion, stable, and no new opacity evident, surgical clips in the left unchanged in position, right lung clear, stable cardiac silhouette  Patient reports  that he is slowly starting to feel better.  His appetite has returned.  He denies any fevers.  Denies any productive cough with discolored mucus.  He feels that he is returning back to baseline.  He does have episodes where he feels more fatigued when walking long distances such as his driveway which is over 250 feet.  Questionaires / Pulmonary Flowsheets:   ACT:  No flowsheet data found.  MMRC: mMRC Dyspnea Scale mMRC Score  04/23/2020 1    Epworth:  No flowsheet data found.  Tests:   10/23/2019-CT chest without contrast-spiculated solid 3 cm left upper lobe lung mass suspicious for primary bronchogenic carcinoma, multidisciplinary thoracic oncology consultation and PET/CT recommended for further evaluation  11/06/2019-CT super D chest without contrast-similar macrolobulated spiculated left upper lobe lung mass which is measurable at 2.2 x 3.3 x 1.7 cm, stable 3 mm subpleural nodules in the right lower lobe  11/11/2019-PET scan-hypermetabolic lingular nodule with mildly hypermetabolic mediastinal hilar lymph nodes, findings are indicative of primary bronchogenic carcinoma likely T1 cN2 M0 or stage IIIa disease.  04/15/2020-chest x-ray-interval development of peripheral slightly wedge-shaped hazy airspace opacity overlying the left upper lobe, findings may represent infection, inflammation or pulmonary infarct, consider CTA chest for further evaluation of possible underlying PE versus infection  04/17/2020-chest x-ray-worsening left midlung pneumonia, small left pleural effusion  12/05/2019-pulmonary function test-FVC 4.19 (96% predicted), postbronchodilator ratio 66, postbronchodilator FEV1 2.84 (92% predicted), positive bronchodilator response in FEV1, mid flow reversibility, DLCO 15.80 (60% predicted)  FENO:  No results found for: NITRICOXIDE  PFT: PFT Results Latest Ref Rng &  Units 12/05/2019  FVC-Pre L 4.19  FVC-Predicted Pre % 96  FVC-Post L 4.33  FVC-Predicted Post % 100  Pre  FEV1/FVC % % 59  Post FEV1/FCV % % 66  FEV1-Pre L 2.47  FEV1-Predicted Pre % 80  FEV1-Post L 2.84  DLCO uncorrected ml/min/mmHg 15.80  DLCO UNC% % 60  DLVA Predicted % 64  TLC L 7.66  TLC % Predicted % 99  RV % Predicted % 118    WALK:  No flowsheet data found.  Imaging: DG Chest 2 View  Result Date: 04/23/2020 CLINICAL DATA:  Recent pneumonia EXAM: CHEST - 2 VIEW COMPARISON:  April 17, 2020 FINDINGS: There is somewhat less opacity in the left mid lung compared to the previous study. Ill-defined opacity remains in this area. There is atelectatic change in the left base with small left pleural effusion, stable. Right lung is clear. Heart size and pulmonary vascularity are normal. No adenopathy. There is aortic atherosclerosis. There are surgical clips in the anterior left mid lung, stable. There is degenerative change in the thoracic spine. IMPRESSION: Partial but incomplete clearing of infiltrate from the left mid lung. Near is mild left base atelectasis with small left pleural effusion, stable. No new opacity evident. Surgical clips on the left unchanged in position. Right lung clear. Stable cardiac silhouette. Aortic Atherosclerosis (ICD10-I70.0). Electronically Signed   By: Lowella Grip III M.D.   On: 04/23/2020 15:00   DG Chest Port 1 View  Result Date: 04/17/2020 CLINICAL DATA:  Fever, chills. EXAM: PORTABLE CHEST 1 VIEW COMPARISON:  April 15, 2020. FINDINGS: Stable cardiomediastinal silhouette. No pneumothorax is noted. Right lung is clear. Increased peripheral opacity seen in left midlung most consistent with worsening pneumonia. Surgical clips are seen over the left midlung. Small left pleural effusion is noted. Bony thorax is unremarkable. IMPRESSION: Worsening left midlung pneumonia.  Small left pleural effusion. Electronically Signed   By: Marijo Conception M.D.   On: 04/17/2020 08:35   DG Chest Portable 1 View  Result Date: 04/15/2020 CLINICAL DATA:  Cough, fevers,  chills. History of left lung cancer, radiation treatment 7/21. EXAM: PORTABLE CHEST 1 VIEW.  Patient is rotated. COMPARISON:  Chest x-ray 11/12/2019, CT chest 11/06/2019 FINDINGS: Surgical clips overlie the left mid lung zone. The heart size and mediastinal contours are within normal limits. Aortic arch calcifications. Interval development of a peripheral hazy, somewhat wedge-shaped, airspace opacity overlying the left upper lobe. No pulmonary edema. Persistent blunting of left costophrenic angle. No definite pleural effusion. No pneumothorax. No acute osseous abnormality. IMPRESSION: Interval development of a peripheral, slightly wedge-shaped, hazy airspace opacity overlying the left upper lobe. Finding may represent infection, inflammation, or even pulmonary infarction. Consider CT angiography chest for further evaluation of possible underlying pulmonary embolus versus infection. These results were called by telephone at the time of interpretation on 04/15/2020 at 5:16 pm to provider DAVID YAO , who verbally acknowledged these results. Electronically Signed   By: Iven Finn M.D.   On: 04/15/2020 17:18    Lab Results:  CBC    Component Value Date/Time   WBC 7.4 04/17/2020 0902   RBC 4.31 04/17/2020 0902   HGB 13.5 04/17/2020 0902   HGB 14.4 10/16/2019 1003   HCT 41.6 04/17/2020 0902   HCT 44.3 10/16/2019 1003   PLT 142 (L) 04/17/2020 0902   PLT 227 10/16/2019 1003   MCV 96.5 04/17/2020 0902   MCV 96 10/16/2019 1003   MCH 31.3 04/17/2020 0902   MCHC 32.5 04/17/2020 0902  RDW 12.7 04/17/2020 0902   RDW 12.0 10/16/2019 1003   LYMPHSABS 0.4 (L) 04/17/2020 0902   MONOABS 1.0 04/17/2020 0902   EOSABS 0.0 04/17/2020 0902   BASOSABS 0.0 04/17/2020 0902    BMET    Component Value Date/Time   NA 136 04/17/2020 0902   NA 141 10/16/2019 1003   K 5.1 04/17/2020 0902   CL 101 04/17/2020 0902   CO2 23 04/17/2020 0902   GLUCOSE 120 (H) 04/17/2020 0902   BUN 29 (H) 04/17/2020 0902   BUN 22  10/16/2019 1003   CREATININE 2.19 (H) 04/17/2020 0902   CALCIUM 8.7 (L) 04/17/2020 0902   GFRNONAA 26 (L) 04/17/2020 0902   GFRAA 30 (L) 04/17/2020 0902    BNP    Component Value Date/Time   BNP 97.9 05/25/2015 1203    ProBNP No results found for: PROBNP  Specialty Problems      Pulmonary Problems   Dyspnea   Lung mass   Primary cancer of left upper lobe of lung (HCC)   CAP (community acquired pneumonia)   COPD (chronic obstructive pulmonary disease) (HCC)      Allergies  Allergen Reactions  . Latex Rash    Mild rash    Immunization History  Administered Date(s) Administered  . Influenza, High Dose Seasonal PF 03/20/2019  . PFIZER SARS-COV-2 Vaccination 09/15/2019, 10/16/2019    Past Medical History:  Diagnosis Date  . Asthma    childhood   . Basal cell cancer    nose  . BPH (benign prostatic hyperplasia)   . GERD (gastroesophageal reflux disease)    occ  . Gout    changed diet years ago ; no gout attacks since   . Hypertension   . Paroxysmal A-fib (HCC)    occurred post-operatively ; last EKG he was in NSR   . Renal insufficiency    non problematic     Tobacco History: Social History   Tobacco Use  Smoking Status Former Smoker  . Packs/day: 1.00  . Years: 30.00  . Pack years: 30.00  . Types: Cigarettes  . Quit date: 10/17/1990  . Years since quitting: 29.5  Smokeless Tobacco Never Used   Counseling given: Not Answered   Continue to not smoke  Outpatient Encounter Medications as of 04/23/2020  Medication Sig  . amLODipine (NORVASC) 10 MG tablet TAKE ONE TABLET BY MOUTH DAILY (Patient taking differently: Take 10 mg by mouth daily. )  . apixaban (ELIQUIS) 2.5 MG TABS tablet Take 1 tablet (2.5 mg total) by mouth 2 (two) times daily.  Marland Kitchen ascorbic acid (VITAMIN C) 500 MG tablet Take 500 mg by mouth every evening.  . cholecalciferol (VITAMIN D3) 25 MCG (1000 UNIT) tablet Take 1,000 Units by mouth in the morning and at bedtime.  . Investigational  - Study Medication Take 1 tablet by mouth daily. Preventable study drug: Atorvastatin 40 mg or placebo  . magnesium hydroxide (MILK OF MAGNESIA) 400 MG/5ML suspension Take 15 mLs by mouth daily as needed for mild constipation.  . metoprolol succinate (TOPROL-XL) 50 MG 24 hr tablet TAKE ONE TABLET BY MOUTH DAILY; OFFICE VISIT NEEDED FOR MORE REFILLS (Patient taking differently: Take 50 mg by mouth daily. )  . [DISCONTINUED] azithromycin (ZITHROMAX) 250 MG tablet Take 1 tablet (250 mg total) by mouth daily. Start on 04/18/20  . [DISCONTINUED] Psyllium (METAMUCIL PO) Take 2 capsules by mouth every evening. Take 1 tablespoonful with water every evening    No facility-administered encounter medications on file as of  04/23/2020.     Review of Systems  Review of Systems  Constitutional: Positive for fatigue (improving ). Negative for activity change, chills, fever and unexpected weight change.  HENT: Negative for congestion, postnasal drip, rhinorrhea, sinus pressure, sinus pain and sore throat.   Eyes: Negative.   Respiratory: Positive for cough and shortness of breath. Negative for wheezing.   Cardiovascular: Negative for chest pain and palpitations.  Gastrointestinal: Negative for constipation, diarrhea, nausea and vomiting.  Endocrine: Negative.   Genitourinary: Negative.   Musculoskeletal: Negative.   Skin: Negative.   Neurological: Negative for dizziness and headaches.  Psychiatric/Behavioral: Negative.  Negative for dysphoric mood. The patient is not nervous/anxious.   All other systems reviewed and are negative.    Physical Exam  BP 120/62 (Cuff Size: Normal)   Pulse 67   Temp (!) 97.3 F (36.3 C) (Oral)   Ht 6\' 1"  (1.854 m)   Wt 191 lb (86.6 kg)   SpO2 98%   BMI 25.20 kg/m   Wt Readings from Last 5 Encounters:  04/23/20 191 lb (86.6 kg)  04/17/20 191 lb 9.3 oz (86.9 kg)  04/15/20 180 lb (81.6 kg)  03/09/20 196 lb (88.9 kg)  01/13/20 194 lb 12.8 oz (88.4 kg)    BMI  Readings from Last 5 Encounters:  04/23/20 25.20 kg/m  04/17/20 25.98 kg/m  04/15/20 23.75 kg/m  03/09/20 25.86 kg/m  01/13/20 25.70 kg/m     Physical Exam Vitals and nursing note reviewed.  Constitutional:      General: He is not in acute distress.    Appearance: Normal appearance. He is normal weight.  HENT:     Head: Normocephalic and atraumatic.     Right Ear: Hearing and external ear normal.     Left Ear: Hearing and external ear normal.     Nose: Nose normal. No mucosal edema or rhinorrhea.     Right Turbinates: Not enlarged.     Left Turbinates: Not enlarged.     Mouth/Throat:     Mouth: Mucous membranes are dry.     Pharynx: Oropharynx is clear. No oropharyngeal exudate.  Eyes:     Pupils: Pupils are equal, round, and reactive to light.  Cardiovascular:     Rate and Rhythm: Normal rate and regular rhythm.     Pulses: Normal pulses.     Heart sounds: Normal heart sounds. No murmur heard.   Pulmonary:     Effort: Pulmonary effort is normal.     Breath sounds: Normal breath sounds. No decreased breath sounds, wheezing or rales.  Abdominal:     Tenderness: There is no abdominal tenderness.  Musculoskeletal:     Cervical back: Normal range of motion.     Right lower leg: No edema.     Left lower leg: No edema.  Lymphadenopathy:     Cervical: No cervical adenopathy.  Skin:    General: Skin is warm and dry.     Capillary Refill: Capillary refill takes less than 2 seconds.     Findings: No erythema or rash.  Neurological:     General: No focal deficit present.     Mental Status: He is alert and oriented to person, place, and time.     Motor: No weakness.     Coordination: Coordination normal.     Gait: Gait is intact. Gait normal.  Psychiatric:        Mood and Affect: Mood normal.        Behavior: Behavior normal. Behavior  is cooperative.        Thought Content: Thought content normal.        Judgment: Judgment normal.       Assessment & Plan:    Primary cancer of left upper lobe of lung (HCC) Plan: Continue follow-up with radiation oncology Continue forward with November/2021 CT of chest  COPD (chronic obstructive pulmonary disease) ([place]) Reviewed pulmonary function testing with patient  Plan: We will continue to clinically monitor Patient would likely benefit from a walk at next office visit will defer at this time as he is currently recovering from a pneumonia   CAP (community acquired pneumonia) Partial clearing of pneumonia seen on chest x-ray Patient also with clinical improvement  Plan: Follow-up in 3 months  Obtain November/2021 CT chest as planned by radiation oncology     Return in about 3 months (around 07/24/2020), or if symptoms worsen or fail to improve, for Follow up with Dr. Valeta Harms.   Lauraine Rinne, NP 04/24/2020   This appointment required 35 minutes of patient care (this includes precharting, chart review, review of results, face-to-face care, etc.).

## 2020-04-24 NOTE — Assessment & Plan Note (Signed)
Plan: Continue follow-up with radiation oncology Continue forward with November/2021 CT of chest

## 2020-04-24 NOTE — Assessment & Plan Note (Addendum)
Partial clearing of pneumonia seen on chest x-ray Patient also with clinical improvement  Plan: Follow-up in 3 months  Obtain November/2021 CT chest as planned by radiation oncology

## 2020-04-24 NOTE — Assessment & Plan Note (Signed)
Reviewed pulmonary function testing with patient  Plan: We will continue to clinically monitor Patient would likely benefit from a walk at next office visit will defer at this time as he is currently recovering from a pneumonia

## 2020-04-26 NOTE — Progress Notes (Signed)
PCCM: Thanks for seeing him Richard Nash, DO [facility] 04/26/2020 10:43 AM

## 2020-04-30 ENCOUNTER — Ambulatory Visit: Payer: Medicare PPO | Admitting: Pulmonary Disease

## 2020-06-10 ENCOUNTER — Encounter (HOSPITAL_COMMUNITY): Payer: Self-pay

## 2020-06-10 ENCOUNTER — Ambulatory Visit (HOSPITAL_COMMUNITY)
Admission: RE | Admit: 2020-06-10 | Discharge: 2020-06-10 | Disposition: A | Discharge status: Home / Self Care | Payer: Medicare PPO | Source: Ambulatory Visit | Attending: Radiation Oncology | Admitting: Radiation Oncology

## 2020-06-10 ENCOUNTER — Other Ambulatory Visit: Payer: Self-pay

## 2020-06-10 DIAGNOSIS — C3412 Malignant neoplasm of upper lobe, left bronchus or lung: Secondary | ICD-10-CM | POA: Insufficient documentation

## 2020-06-10 DIAGNOSIS — R0602 Shortness of breath: Secondary | ICD-10-CM | POA: Diagnosis not present

## 2020-06-14 NOTE — Progress Notes (Signed)
Radiation Oncology         [phone]) [phone] ________________________________  Name: Richard Vincent. MRN: [id number]  Date: 06/15/2020  DOB: 1931/07/23  Follow-Up Visit Note  CC: Patient, No Pcp Per  Icard, Bradley L, DO    ICD-10-CM   1. Primary cancer of left upper lobe of lung (HCC)  C34.12 CT Chest Wo Contrast    Diagnosis: Stage IA3 (T1c,N0, M0) squamous cell carcinoma of the left upper lobe  Interval Since Last Radiation: Four months and six days  Radiation Treatment Dates: 01/29/2020 through 02/07/2020 Site Technique Total Dose (Gy) Dose per Fx (Gy) Completed Fx Beam Energies  Lung, Left: Lung_Lt IMRT 60/60 12 5/5 6XFFF    Narrative:  The patient returns today for routine follow-up. Since his last visit, he was seen in the ED on 04/17/2020 for evaluation of fever and chills. Chest x-ray at that time showed worsening left mid-lung pneumonia and a small left pleural effusion. He was treated with IV antibiotics. Repeat chest x-ray on 04/23/2020 showed partial but incomplete clearing of the infiltrate in the left mid lung. Near that was mild left base atelectasis with a small left pleural effusion that was stable. There was no new opacity evidence. Surgical clips on the left were unchanged in position. The right lung was clear.  Chest CT scan on 06/10/2020 showed interval radiation changes in the lingula surrounding and partly obscuring the previously demonstrated lingular mass. There was no gross local progression or progressive metastatic disease. However, there was a new small dependent left pleural effusion without pleural nodularity.  On review of systems, he reports reports occasional discomfort along the left lateral chest wall with occasional movements. He denies breathing problems or hemoptysis.              ALLERGIES:  is allergic to latex.  Meds: Current Outpatient Medications  Medication Sig Dispense Refill   amLODipine (NORVASC) 10 MG tablet TAKE ONE TABLET BY  MOUTH DAILY 90 tablet 3   apixaban (ELIQUIS) 2.5 MG TABS tablet Take 1 tablet (2.5 mg total) by mouth 2 (two) times daily. 60 tablet 5   ascorbic acid (VITAMIN C) 500 MG tablet Take 500 mg by mouth every evening.     cholecalciferol (VITAMIN D3) 25 MCG (1000 UNIT) tablet Take 1,000 Units by mouth in the morning and at bedtime.     Investigational - Study Medication Take 1 tablet by mouth daily. Preventable study drug: Atorvastatin 40 mg or placebo     magnesium hydroxide (MILK OF MAGNESIA) 400 MG/5ML suspension Take 15 mLs by mouth daily as needed for mild constipation.     metoprolol succinate (TOPROL-XL) 50 MG 24 hr tablet TAKE ONE TABLET BY MOUTH DAILY; OFFICE VISIT NEEDED FOR MORE REFILLS 90 tablet 3   No current facility-administered medications for this encounter.    Physical Findings: The patient is in no acute distress. Patient is alert and oriented.  height is 6\' 1"  (1.854 m) and weight is 195 lb 3.2 oz (88.5 kg). His temperature is 97.7 F (36.5 C). His blood pressure is 147/82 (abnormal) and his pulse is 71. His respiration is 20 and oxygen saturation is 100%.  No significant changes. Lungs are clear to auscultation bilaterally. Heart has regular rate and rhythm. No palpable cervical, supraclavicular, or axillary adenopathy. Abdomen soft, non-tender, normal bowel sounds.   Lab Findings: Lab Results  Component Value Date   WBC 7.4 04/17/2020   HGB 13.5 04/17/2020   HCT 41.6 04/17/2020  MCV 96.5 04/17/2020   PLT 142 (L) 04/17/2020    Radiographic Findings: CT Chest Wo Contrast  Result Date: 06/10/2020 CLINICAL DATA:  Non-small cell lung cancer. Assess treatment response. Radiation therapy completed. Shortness of breath. EXAM: CT CHEST WITHOUT CONTRAST TECHNIQUE: Multidetector CT imaging of the chest was performed following the standard protocol without IV contrast. COMPARISON:  CT 11/06/2019 and 10/23/2019.  PET-CT 11/11/2019. FINDINGS: Cardiovascular: Again  demonstrated is moderate diffuse atherosclerosis of the aorta, great vessels and coronary arteries. No acute vascular findings on noncontrast imaging. Prominent calcifications of the aortic valve. The heart size is normal. There is no pericardial effusion. Mediastinum/Nodes: There are no enlarged mediastinal, hilar or axillary lymph nodes.Small mediastinal lymph nodes are unchanged. Hilar assessment is limited by the lack of intravenous contrast, although the hilar contours appear unchanged. Stable small hiatal hernia. The thyroid gland and trachea appear normal. Lungs/Pleura: New small dependent left pleural effusion without pleural nodularity. No right pleural effusion. Mild to moderate centrilobular and paraseptal emphysema. There are new fiducial markers in the lingula surrounding the lingular mass with associated volume loss. The mass is partly obscured by adjacent radiation changes, although appears similar in size to the previous study, measuring approximately 3.1 x 2.6 cm on image 93/7. Stable tiny subpleural right lung nodules on images 70 and 110 of series 7. No new or enlarging pulmonary nodules are identified. There is stable subpleural scarring peripherally in the right middle lobe. Upper abdomen: The visualized upper abdomen appears stable without acute findings. Small low-density lesions in the left hepatic lobe are likely cysts. There are multiple gallstones. The adrenal glands appear stable. There is severe right renal cortical thinning with grossly stable probable right renal cysts. Musculoskeletal/Chest wall: There is no chest wall mass or suspicious osseous finding. IMPRESSION: 1. Interval radiation changes in the lingula surrounding and partly obscuring the previously demonstrated lingular mass. No gross local progression or progressive metastatic disease. 2. New small dependent left pleural effusion without pleural nodularity. 3. Aortic Atherosclerosis (ICD10-I70.0) and Emphysema (ICD10-J43.9).  Electronically Signed   By: Richardean Sale M.D.   On: 06/10/2020 15:49    Impression: Stage IA3 (T1c,N0, M0) squamous cell carcinoma of the left upper lobe  No evidence of recurrence on clinical exam today. Most recent chest CT scan showed interval radiation changes without gross local progression or progressive metastatic disease.  Plan: The patient will follow-up with radiation oncology in 6 months.  Prior to this follow-up the patient will have a CT scan of the chest  Total time spent in this encounter was 20 minutes which included reviewing the patient's most recent ED visit, chest x-rays, chest CT scan, follow-ups, physical examination, and documentation.  ____________________________________   Blair Promise, PhD, MD  This document serves as a record of services personally performed by Gery Pray, MD. It was created on his behalf by Clerance Lav, a trained medical scribe. The creation of this record is based on the scribe's personal observations and the provider's statements to them. This document has been checked and approved by the attending provider.

## 2020-06-15 ENCOUNTER — Other Ambulatory Visit: Payer: Self-pay

## 2020-06-15 ENCOUNTER — Ambulatory Visit
Admission: RE | Admit: 2020-06-15 | Discharge: 2020-06-15 | Disposition: A | Discharge status: Home / Self Care | Payer: Medicare PPO | Source: Ambulatory Visit | Attending: Radiation Oncology | Admitting: Radiation Oncology

## 2020-06-15 ENCOUNTER — Encounter: Payer: Self-pay | Admitting: Radiation Oncology

## 2020-06-15 VITALS — BP 147/82 | HR 71 | Temp 97.7°F | Resp 20 | Ht 73.0 in | Wt 195.2 lb

## 2020-06-15 DIAGNOSIS — Z85118 Personal history of other malignant neoplasm of bronchus and lung: Secondary | ICD-10-CM | POA: Diagnosis not present

## 2020-06-15 DIAGNOSIS — Z7901 Long term (current) use of anticoagulants: Secondary | ICD-10-CM | POA: Insufficient documentation

## 2020-06-15 DIAGNOSIS — J90 Pleural effusion, not elsewhere classified: Secondary | ICD-10-CM | POA: Diagnosis not present

## 2020-06-15 DIAGNOSIS — Z923 Personal history of irradiation: Secondary | ICD-10-CM | POA: Insufficient documentation

## 2020-06-15 DIAGNOSIS — Z79899 Other long term (current) drug therapy: Secondary | ICD-10-CM | POA: Diagnosis not present

## 2020-06-15 DIAGNOSIS — Z08 Encounter for follow-up examination after completed treatment for malignant neoplasm: Secondary | ICD-10-CM | POA: Diagnosis not present

## 2020-06-15 DIAGNOSIS — K8080 Other cholelithiasis without obstruction: Secondary | ICD-10-CM | POA: Diagnosis not present

## 2020-06-15 DIAGNOSIS — J439 Emphysema, unspecified: Secondary | ICD-10-CM | POA: Insufficient documentation

## 2020-06-15 DIAGNOSIS — C3412 Malignant neoplasm of upper lobe, left bronchus or lung: Secondary | ICD-10-CM

## 2020-06-15 DIAGNOSIS — I700 Atherosclerosis of aorta: Secondary | ICD-10-CM | POA: Diagnosis not present

## 2020-06-15 NOTE — Progress Notes (Signed)
Patient in for follow up. He is complaining of pain in his left chest area. Intermittent can be up to a 6-7 or just a 3-4. Maybe a pulled muscle.

## 2020-06-17 ENCOUNTER — Encounter (HOSPITAL_BASED_OUTPATIENT_CLINIC_OR_DEPARTMENT_OTHER): Payer: Self-pay | Admitting: *Deleted

## 2020-06-17 ENCOUNTER — Other Ambulatory Visit: Payer: Self-pay

## 2020-06-17 ENCOUNTER — Emergency Department (HOSPITAL_BASED_OUTPATIENT_CLINIC_OR_DEPARTMENT_OTHER): Payer: Medicare PPO

## 2020-06-17 ENCOUNTER — Emergency Department (HOSPITAL_BASED_OUTPATIENT_CLINIC_OR_DEPARTMENT_OTHER)
Admission: EM | Admit: 2020-06-17 | Discharge: 2020-06-17 | Disposition: A | Discharge status: Home / Self Care | Payer: Medicare PPO | Attending: Emergency Medicine | Admitting: Emergency Medicine

## 2020-06-17 DIAGNOSIS — J449 Chronic obstructive pulmonary disease, unspecified: Secondary | ICD-10-CM | POA: Insufficient documentation

## 2020-06-17 DIAGNOSIS — Z91040 Latex allergy status: Secondary | ICD-10-CM | POA: Insufficient documentation

## 2020-06-17 DIAGNOSIS — Z7901 Long term (current) use of anticoagulants: Secondary | ICD-10-CM | POA: Diagnosis not present

## 2020-06-17 DIAGNOSIS — Z79899 Other long term (current) drug therapy: Secondary | ICD-10-CM | POA: Insufficient documentation

## 2020-06-17 DIAGNOSIS — R0789 Other chest pain: Secondary | ICD-10-CM | POA: Diagnosis not present

## 2020-06-17 DIAGNOSIS — R918 Other nonspecific abnormal finding of lung field: Secondary | ICD-10-CM

## 2020-06-17 DIAGNOSIS — Z87891 Personal history of nicotine dependence: Secondary | ICD-10-CM | POA: Diagnosis not present

## 2020-06-17 DIAGNOSIS — Z923 Personal history of irradiation: Secondary | ICD-10-CM | POA: Diagnosis not present

## 2020-06-17 DIAGNOSIS — Z85828 Personal history of other malignant neoplasm of skin: Secondary | ICD-10-CM | POA: Diagnosis not present

## 2020-06-17 DIAGNOSIS — I10 Essential (primary) hypertension: Secondary | ICD-10-CM | POA: Insufficient documentation

## 2020-06-17 DIAGNOSIS — J45909 Unspecified asthma, uncomplicated: Secondary | ICD-10-CM | POA: Insufficient documentation

## 2020-06-17 DIAGNOSIS — Z85118 Personal history of other malignant neoplasm of bronchus and lung: Secondary | ICD-10-CM | POA: Diagnosis not present

## 2020-06-17 DIAGNOSIS — R079 Chest pain, unspecified: Secondary | ICD-10-CM | POA: Diagnosis not present

## 2020-06-17 LAB — COMPREHENSIVE METABOLIC PANEL
ALT: 10 U/L (ref 0–44)
AST: 17 U/L (ref 15–41)
Albumin: 4 g/dL (ref 3.5–5.0)
Alkaline Phosphatase: 65 U/L (ref 38–126)
Anion gap: 10 (ref 5–15)
BUN: 30 mg/dL — ABNORMAL HIGH (ref 8–23)
CO2: 24 mmol/L (ref 22–32)
Calcium: 9 mg/dL (ref 8.9–10.3)
Chloride: 104 mmol/L (ref 98–111)
Creatinine, Ser: 1.76 mg/dL — ABNORMAL HIGH (ref 0.61–1.24)
GFR, Estimated: 37 mL/min — ABNORMAL LOW (ref >60)
Glucose, Bld: 91 mg/dL (ref 70–99)
Potassium: 4.4 mmol/L (ref 3.5–5.1)
Sodium: 138 mmol/L (ref 135–145)
Total Bilirubin: 0.9 mg/dL (ref 0.3–1.2)
Total Protein: 7.2 g/dL (ref 6.5–8.1)

## 2020-06-17 LAB — CBC WITH DIFFERENTIAL/PLATELET
Abs Immature Granulocytes: 0.07 10*3/uL (ref 0.00–0.07)
Basophils Absolute: 0.1 10*3/uL (ref 0.0–0.1)
Basophils Relative: 1 %
Eosinophils Absolute: 0.1 10*3/uL (ref 0.0–0.5)
Eosinophils Relative: 2 %
HCT: 45.2 % (ref 39.0–52.0)
Hemoglobin: 15 g/dL (ref 13.0–17.0)
Immature Granulocytes: 1 %
Lymphocytes Relative: 26 %
Lymphs Abs: 1.9 10*3/uL (ref 0.7–4.0)
MCH: 32.3 pg (ref 26.0–34.0)
MCHC: 33.2 g/dL (ref 30.0–36.0)
MCV: 97.4 fL (ref 80.0–100.0)
Monocytes Absolute: 0.9 10*3/uL (ref 0.1–1.0)
Monocytes Relative: 13 %
Neutro Abs: 4.3 10*3/uL (ref 1.7–7.7)
Neutrophils Relative %: 57 %
Platelets: 260 10*3/uL (ref 150–400)
RBC: 4.64 MIL/uL (ref 4.22–5.81)
RDW: 13.4 % (ref 11.5–15.5)
WBC: 7.4 10*3/uL (ref 4.0–10.5)
nRBC: 0 % (ref 0.0–0.2)

## 2020-06-17 LAB — TROPONIN I (HIGH SENSITIVITY): Troponin I (High Sensitivity): 5 ng/L (ref <18)

## 2020-06-17 MED ORDER — TRAMADOL HCL 50 MG PO TABS
50.0000 mg | ORAL_TABLET | Freq: Four times a day (QID) | ORAL | 0 refills | Status: DC | PRN
Start: 2020-06-17 — End: 2020-08-17

## 2020-06-17 NOTE — ED Provider Notes (Signed)
[facility] EMERGENCY DEPARTMENT Provider Note   CSN: [id number] Arrival date & time: 06/17/20  1318     History Chief Complaint  Patient presents with  . Chest Pain    Richard Vincent. is a 86 y.o. male hx of afib on eliquis, GERD, HTN, here with chest pain.  Patient has been having intermittent left-sided chest pain radiated down to the left arm for the last several days.  He states that there is no particular pattern.  He states that it just happens randomly.  He denies any exertional chest pain or shortness of breath.  Denies any pleuritic chest pain either.  Patient denies any cough or fever.  Patient was seen in the ED about 2 months ago and was diagnosed with pneumonia and finished a course of antibiotics.  Patient had follow-up with oncology about a week ago and a CT that showed a known lingula mass with the pleural effusion.  Patient states that he was looking up on the Internet and he was told that he has pleurisy.   The history is provided by the patient.       Past Medical History:  Diagnosis Date  . Asthma    childhood   . Basal cell cancer    nose  . BPH (benign prostatic hyperplasia)   . GERD (gastroesophageal reflux disease)    occ  . Gout    changed diet years ago ; no gout attacks since   . Hypertension   . Lung cancer ([place]) 2021  . Paroxysmal A-fib ([facility])    occurred post-operatively ; last EKG he was in NSR   . Renal insufficiency    non problematic     Patient Active Problem List   Diagnosis Date Noted  . COPD (chronic obstructive pulmonary disease) ([place]) 04/23/2020  . CAP (community acquired pneumonia) 04/23/2020  . Primary cancer of left upper lobe of lung ([place]) 11/21/2019  . Lung mass 11/12/2019  . BPH with obstruction/lower urinary tract symptoms 12/14/2017  . Palpitations 01/04/2012  . Hypertension 01/04/2012  . Dyspnea 01/04/2012    Past Surgical History:  Procedure Laterality Date  . BRONCHIAL BIOPSY  11/12/2019    Procedure: BRONCHIAL BIOPSIES;  Surgeon: [name], DO;  Location: [facility];  Service: Pulmonary;;  . BRONCHIAL BRUSHINGS  11/12/2019   Procedure: BRONCHIAL BRUSHINGS;  Surgeon: [name], DO;  Location: [facility];  Service: Pulmonary;;  . BRONCHIAL WASHINGS  11/12/2019   Procedure: BRONCHIAL WASHINGS;  Surgeon: [name], DO;  Location: [facility];  Service: Pulmonary;;  . ENDOBRONCHIAL ULTRASOUND  11/12/2019   Procedure: ENDOBRONCHIAL ULTRASOUND;  Surgeon: [name], DO;  Location: [facility];  Service: Pulmonary;;  . EYE SURGERY  about 5-6 years ago    cataracts bilateral   . FIDUCIAL MARKER PLACEMENT  11/12/2019   Procedure: FIDUCIAL MARKER PLACEMENT;  Surgeon: [name], DO;  Location: [facility];  Service: Pulmonary;;  . FINE NEEDLE ASPIRATION  11/12/2019   Procedure: FINE NEEDLE ASPIRATION (FNA) LINEAR;  Surgeon: [name], DO;  Location: [facility];  Service: Pulmonary;;  . NOSE SURGERY    . Skin cancer removed    . TONSILLECTOMY    . TRANSURETHRAL RESECTION OF PROSTATE    . VIDEO BRONCHOSCOPY WITH ENDOBRONCHIAL NAVIGATION N/A 11/12/2019   Procedure: VIDEO BRONCHOSCOPY WITH ENDOBRONCHIAL NAVIGATION;  Surgeon: [name], DO;  Location: [facility];  Service: Pulmonary;  Laterality: N/A;  . XI ROBOTIC ASSISTED SIMPLE PROSTATECTOMY N/A  12/14/2017   Procedure: XI ROBOTIC ASSISTED SIMPLE PROSTATECTOMY;  Surgeon: Cleon Gustin, MD;  Location: [facility];  Service: Urology;  Laterality: N/A;       Family History  Problem Relation Age of Onset  . Coronary artery disease Other        No family history    Social History   Tobacco Use  . Smoking status: Former Smoker    Packs/day: 1.00    Years: 30.00    Pack years: 30.00    Types: Cigarettes    Quit date: 10/17/1990    Years since quitting: 29.6  . Smokeless tobacco: Never Used  Vaping Use  . Vaping Use: Never used  Substance Use Topics  . Alcohol use: Yes     Alcohol/week: 1.0 - 2.0 standard drink    Types: 1 - 2 Glasses of wine per week    Comment: every other day  . Drug use: No    Home Medications Prior to Admission medications   Medication Sig Start Date End Date Taking? Authorizing Provider  amLODipine (NORVASC) 10 MG tablet TAKE ONE TABLET BY MOUTH DAILY 07/09/19   Lelon Perla, MD  apixaban (ELIQUIS) 2.5 MG TABS tablet Take 1 tablet (2.5 mg total) by mouth 2 (two) times daily. 03/06/20   Lelon Perla, MD  ascorbic acid (VITAMIN C) 500 MG tablet Take 500 mg by mouth every evening.    [provider]  cholecalciferol (VITAMIN D3) 25 MCG (1000 UNIT) tablet Take 1,000 Units by mouth in the morning and at bedtime.    [provider]  Investigational - Study Medication Take 1 tablet by mouth daily. Preventable study drug: Atorvastatin 40 mg or placebo    [provider]  magnesium hydroxide (MILK OF MAGNESIA) 400 MG/5ML suspension Take 15 mLs by mouth daily as needed for mild constipation.    [provider]  metoprolol succinate (TOPROL-XL) 50 MG 24 hr tablet TAKE ONE TABLET BY MOUTH DAILY; OFFICE VISIT NEEDED FOR MORE REFILLS 10/21/19   Lelon Perla, MD    Allergies    Latex  Review of Systems   Review of Systems  Cardiovascular: Positive for chest pain.  All other systems reviewed and are negative.   Physical Exam Updated Vital Signs BP 124/77 (BP Location: Right Arm)   Pulse 66   Resp 20   Ht 6\' 1"  (1.854 m)   Wt 86.6 kg   SpO2 99%   BMI 25.20 kg/m   Physical Exam Vitals and nursing note reviewed.  Constitutional:      Appearance: He is well-developed.  HENT:     Head: Normocephalic.  Eyes:     Extraocular Movements: Extraocular movements intact.     Pupils: Pupils are equal, round, and reactive to light.  Cardiovascular:     Rate and Rhythm: Normal rate and regular rhythm.     Heart sounds: Normal heart sounds.  Pulmonary:     Effort: Pulmonary effort is normal.      Breath sounds: Normal breath sounds.  Abdominal:     General: Bowel sounds are normal.     Palpations: Abdomen is soft.  Musculoskeletal:        General: Normal range of motion.     Cervical back: Normal range of motion and neck supple.  Skin:    General: Skin is warm.     Capillary Refill: Capillary refill takes less than 2 seconds.  Neurological:     General: No focal deficit  present.     Mental Status: He is alert and oriented to person, place, and time.  Psychiatric:        Mood and Affect: Mood normal.        Behavior: Behavior normal.     ED Results / Procedures / Treatments   Labs (all labs ordered are listed, but only abnormal results are displayed) Labs Reviewed  COMPREHENSIVE METABOLIC PANEL - Abnormal; Notable for the following components:      Result Value   BUN 30 (*)    Creatinine, Ser 1.76 (*)    GFR, Estimated 37 (*)    All other components within normal limits  CBC WITH DIFFERENTIAL/PLATELET  TROPONIN I (HIGH SENSITIVITY)    EKG EKG Interpretation  Date/Time:  Wednesday June 17 2020 13:23:26 EST Ventricular Rate:  71 PR Interval:    QRS Duration: 76 QT Interval:  382 QTC Calculation: 415 R Axis:   89 Text Interpretation: Atrial fibrillation Low voltage QRS Cannot rule out Anteroseptal infarct , age undetermined Abnormal ECG No significant change since last tracing Confirmed by Wandra Arthurs [phone]) on 06/17/2020 3:52:08 PM   Radiology DG Chest 2 View  Result Date: 06/17/2020 CLINICAL DATA:  Chest pain. EXAM: CHEST - 2 VIEW COMPARISON:  CT chest November 24, 21. Chest radiograph October 7, 21. FINDINGS: Small left pleural effusion. Lingular mass with surrounding radiation change, better characterized on recent CT chest. Hazy left basilar opacity is favored to reflect a layering small left pleural effusion. No visible pneumothorax. No acute osseous abnormality. IMPRESSION: Lingular mass with surrounding radiation change, better characterized on recent  CT chest. Hazy left basilar opacity is favored to reflect a layering small left pleural effusion. Electronically Signed   By: Margaretha Sheffield MD   On: 06/17/2020 14:37    Procedures Procedures (including critical care time)  Medications Ordered in ED Medications - No data to display  ED Course  I have reviewed the triage vital signs and the nursing notes.  Pertinent labs & imaging results that were available during my care of the patient were reviewed by me and considered in my medical decision making (see chart for details).    MDM Rules/Calculators/A&P                         Richard Vincent. is a 86 y.o. male here presenting with chest pain.  Chest pain going on for a week or so.  Low risk for ACS.  Patient is already on Eliquis so I doubt PE.  Patient is hemodynamically stable.  Patient thought he has pleurisy.  I think he has a known mass and sometimes that can cause some pain.  His chest x-ray is stable from previous.  His white blood cell count is normal.  He is afebrile.  At this point, I think he is stable for discharge to follow-up with oncology outpatient.  We will give some tramadol as needed for pain.   Final Clinical Impression(s) / ED Diagnoses Final diagnoses:  None    Rx / DC Orders ED Discharge Orders    None       Drenda Freeze, MD 06/17/20 1610

## 2020-06-17 NOTE — Discharge Instructions (Signed)
Take tylenol for pain   Take tramadol for severe pain  You likely have pleurisy from your lung mass   See your oncologist for follow up   Return to ER if you have worse chest pain, trouble breathing, shortness of breath, fever

## 2020-06-17 NOTE — ED Notes (Signed)
ED Provider at bedside. 

## 2020-06-17 NOTE — ED Triage Notes (Signed)
C/o left sided chest pain which radiates down left arm x 1 week , recent Dx pneumonia

## 2020-07-06 ENCOUNTER — Other Ambulatory Visit: Payer: Self-pay | Admitting: Cardiology

## 2020-08-03 ENCOUNTER — Ambulatory Visit: Payer: Medicare PPO | Admitting: Pulmonary Disease

## 2020-08-13 NOTE — Progress Notes (Signed)
HPI: FU atrial fibrillation. Nuclear study December 2016 showed ejection fraction 53%, prior infarct but no ischemia. Patient found to be in atrial fibrillation following surgical procedure in [facility]. Echo July 2018 showed normal LV function, grade 2 diastolic dysfunction, mild mitral regurgitation and mild left atrial enlargement. Plan was to proceed with cardioversion if atrial fibrillation persistedas this may have been postoperative atrial fibrillation.However he converted spontaneously but atrial fibrillation recurred.Patient had [facility] screening in September 2019 that showed mild carotid artery disease bilaterally, no abdominal aortic aneurysm and normal ABIs.  Patient diagnosed with lung cancer since I last saw him.  Since I last saw him,there is no dyspnea, chest pain, palpitations or syncope.  No bleeding.  Current Outpatient Medications  Medication Sig Dispense Refill  . amLODipine (NORVASC) 10 MG tablet TAKE ONE TABLET BY MOUTH DAILY 90 tablet 3  . apixaban (ELIQUIS) 2.5 MG TABS tablet Take 1 tablet (2.5 mg total) by mouth 2 (two) times daily. 60 tablet 5  . ascorbic acid (VITAMIN C) 500 MG tablet Take 500 mg by mouth every evening.    . cholecalciferol (VITAMIN D3) 25 MCG (1000 UNIT) tablet Take 1,000 Units by mouth in the morning and at bedtime.    . Investigational - Study Medication Take 1 tablet by mouth daily. Preventable study drug: Atorvastatin 40 mg or placebo    . metoprolol succinate (TOPROL-XL) 50 MG 24 hr tablet TAKE ONE TABLET BY MOUTH DAILY; OFFICE VISIT NEEDED FOR MORE REFILLS 90 tablet 3  . OVER THE COUNTER MEDICATION Take 8 g by mouth daily. Psyllium husk fiber     No current facility-administered medications for this visit.     Past Medical History:  Diagnosis Date  . Asthma    childhood   . Basal cell cancer    nose  . BPH (benign prostatic hyperplasia)   . GERD (gastroesophageal reflux disease)    occ  . Gout     changed diet years ago ; no gout attacks since   . Hypertension   . Lung cancer ([place]) 2021  . Paroxysmal A-fib (HCC)    occurred post-operatively ; last EKG he was in NSR   . Renal insufficiency    non problematic     Past Surgical History:  Procedure Laterality Date  . BRONCHIAL BIOPSY  11/12/2019   Procedure: BRONCHIAL BIOPSIES;  Surgeon: [name], DO;  Location: [facility];  Service: Pulmonary;;  . BRONCHIAL BRUSHINGS  11/12/2019   Procedure: BRONCHIAL BRUSHINGS;  Surgeon: [name], DO;  Location: [facility];  Service: Pulmonary;;  . BRONCHIAL WASHINGS  11/12/2019   Procedure: BRONCHIAL WASHINGS;  Surgeon: [name], DO;  Location: [facility];  Service: Pulmonary;;  . ENDOBRONCHIAL ULTRASOUND  11/12/2019   Procedure: ENDOBRONCHIAL ULTRASOUND;  Surgeon: [name], DO;  Location: [facility];  Service: Pulmonary;;  . EYE SURGERY  about 5-6 years ago    cataracts bilateral   . FIDUCIAL MARKER PLACEMENT  11/12/2019   Procedure: FIDUCIAL MARKER PLACEMENT;  Surgeon: [name], DO;  Location: [facility];  Service: Pulmonary;;  . FINE NEEDLE ASPIRATION  11/12/2019   Procedure: FINE NEEDLE ASPIRATION (FNA) LINEAR;  Surgeon: [name], DO;  Location: [facility];  Service: Pulmonary;;  . NOSE SURGERY    . Skin cancer removed    . TONSILLECTOMY    . TRANSURETHRAL RESECTION OF PROSTATE    . VIDEO BRONCHOSCOPY WITH ENDOBRONCHIAL NAVIGATION N/A 11/12/2019   Procedure:  VIDEO BRONCHOSCOPY WITH ENDOBRONCHIAL NAVIGATION;  Surgeon: [name], DO;  Location: [facility];  Service: Pulmonary;  Laterality: N/A;  . XI ROBOTIC ASSISTED SIMPLE PROSTATECTOMY N/A 12/14/2017   Procedure: XI ROBOTIC ASSISTED SIMPLE PROSTATECTOMY;  Surgeon: Cleon Gustin, MD;  Location: [facility];  Service: Urology;  Laterality: N/A;    Social History   Socioeconomic History  . Marital status: Married    Spouse name: Not on file  . Number of children: 6  . Years  of education: Not on file  . Highest education level: Not on file  Occupational History    Comment: Engineer  Tobacco Use  . Smoking status: Former Smoker    Packs/day: 1.00    Years: 30.00    Pack years: 30.00    Types: Cigarettes    Quit date: 10/17/1990    Years since quitting: 29.8  . Smokeless tobacco: Never Used  Vaping Use  . Vaping Use: Never used  Substance and Sexual Activity  . Alcohol use: Yes    Alcohol/week: 1.0 - 2.0 standard drink    Types: 1 - 2 Glasses of wine per week    Comment: every other day  . Drug use: No  . Sexual activity: Not on file  Other Topics Concern  . Not on file  Social History Narrative  . Not on file   Social Determinants of Health   Financial Resource Strain: Not on file  Food Insecurity: Not on file  Transportation Needs: Not on file  Physical Activity: Not on file  Stress: Not on file  Social Connections: Not on file  Intimate Partner Violence: Not on file    Family History  Problem Relation Age of Onset  . Coronary artery disease Other        No family history    ROS: no fevers or chills, productive cough, hemoptysis, dysphasia, odynophagia, melena, hematochezia, dysuria, hematuria, rash, seizure activity, orthopnea, PND, pedal edema, claudication. Remaining systems are negative.  Physical Exam: Well-developed well-nourished in no acute distress.  Skin is warm and dry.  HEENT is normal.  Neck is supple.  Chest is clear to auscultation with normal expansion.  Cardiovascular exam is irregular Abdominal exam nontender or distended. No masses palpated. Extremities show no edema. neuro grossly intact   A/P  1 permanent atrial fibrillation-continue present dose of beta-blocker for rate control.  Continue apixaban.  2 hypertension-patient's blood pressure is controlled.  Continue present medications and follow.  3 history of palpitations-symptoms are controlled with present dose of Toprol.  Kirk Ruths, MD

## 2020-08-17 ENCOUNTER — Ambulatory Visit: Payer: Medicare PPO | Admitting: Pulmonary Disease

## 2020-08-17 ENCOUNTER — Encounter: Payer: Self-pay | Admitting: Pulmonary Disease

## 2020-08-17 ENCOUNTER — Other Ambulatory Visit: Payer: Self-pay

## 2020-08-17 VITALS — BP 104/64 | HR 64 | Temp 97.3°F | Ht 73.0 in | Wt 193.4 lb

## 2020-08-17 DIAGNOSIS — J432 Centrilobular emphysema: Secondary | ICD-10-CM | POA: Diagnosis not present

## 2020-08-17 DIAGNOSIS — C3412 Malignant neoplasm of upper lobe, left bronchus or lung: Secondary | ICD-10-CM | POA: Diagnosis not present

## 2020-08-17 DIAGNOSIS — J439 Emphysema, unspecified: Secondary | ICD-10-CM | POA: Diagnosis not present

## 2020-08-17 DIAGNOSIS — C3492 Malignant neoplasm of unspecified part of left bronchus or lung: Secondary | ICD-10-CM | POA: Diagnosis not present

## 2020-08-17 DIAGNOSIS — Z87891 Personal history of nicotine dependence: Secondary | ICD-10-CM | POA: Diagnosis not present

## 2020-08-17 DIAGNOSIS — Z923 Personal history of irradiation: Secondary | ICD-10-CM | POA: Diagnosis not present

## 2020-08-17 NOTE — Patient Instructions (Signed)
Thank you for visiting Dr. Valeta Harms at [facility]. Today we recommend the following:  Return in about 5 months (around 01/14/2021).    Please do your part to reduce the spread of COVID-19.

## 2020-08-17 NOTE — Progress Notes (Signed)
Synopsis: Referred in April 2021 for lung mass by No ref. provider found  Subjective:   PATIENT ID: Richard Vincent. GENDER: male DOB: [birth year], MRN: [id number]  Chief Complaint  Patient presents with  . Follow-up    Yearly follow up on lung nodules    This is an 87 year old gentleman past medical history of asthma, hypertension, A. fib on anticoagulation, gastroesophageal reflux disease.  Patient was referred to our clinic for abnormality and CT imaging, CT of the chest on 10/23/2019 which revealed a new spiculated 3 cm left upper lobe lung mass concerning for primary bronchogenic carcinoma.  Patient is a former smoker quit 1996. He has no complaints today. Overall has a good functional status. Able to complete all of his activities of daily living. He lives with his wife. Able to push mow the yard. He is on Eliquis for stroke prevention and atrial fibrillation.  OV 11/27/2019: Here today for follow-up after recent bronchoscopy.  Doing well at this time.  Reviewed recent pathology results.  Patient has been seen by radiation oncology already.  Patient is anxious today about all of this new recent news.  At this point no additional respiratory symptoms.  Considering evaluation for surgery.  Referral has already been placed to see Dr. Roxan Hockey.  OV 08/17/2020: Here for follow-up after recent diagnosis of pneumonia.  Had follow-up in clinic with Wyn Quaker, NP.  Also was seen in the ER at [facility].  He is doing well at this time.  Was diagnosed with purulent pleurisy.  He completed 5 cycles of SBRT for his upper lobe lung cancer.  He has no hemoptysis.  He has 73-month imaging follow-up and seeing radiation oncology in June 2022.  From respiratory standpoint and denies shortness of breath cough sputum production hemoptysis.  Currently uses no inhalers.  Prior pulmonary function test completed during this work-up revealed mild obstructive disease.   Past Medical History:  Diagnosis Date  .  Asthma    childhood   . Basal cell cancer    nose  . BPH (benign prostatic hyperplasia)   . GERD (gastroesophageal reflux disease)    occ  . Gout    changed diet years ago ; no gout attacks since   . Hypertension   . Lung cancer ([place]) 2021  . Paroxysmal A-fib (HCC)    occurred post-operatively ; last EKG he was in NSR   . Renal insufficiency    non problematic      Family History  Problem Relation Age of Onset  . Coronary artery disease Other        No family history     Past Surgical History:  Procedure Laterality Date  . BRONCHIAL BIOPSY  11/12/2019   Procedure: BRONCHIAL BIOPSIES;  Surgeon: [name], DO;  Location: [facility];  Service: Pulmonary;;  . BRONCHIAL BRUSHINGS  11/12/2019   Procedure: BRONCHIAL BRUSHINGS;  Surgeon: [name], DO;  Location: [facility];  Service: Pulmonary;;  . BRONCHIAL WASHINGS  11/12/2019   Procedure: BRONCHIAL WASHINGS;  Surgeon: [name], DO;  Location: [facility];  Service: Pulmonary;;  . ENDOBRONCHIAL ULTRASOUND  11/12/2019   Procedure: ENDOBRONCHIAL ULTRASOUND;  Surgeon: [name], DO;  Location: [facility];  Service: Pulmonary;;  . EYE SURGERY  about 5-6 years ago    cataracts bilateral   . FIDUCIAL MARKER PLACEMENT  11/12/2019   Procedure: FIDUCIAL MARKER PLACEMENT;  Surgeon: [name], DO;  Location: [facility];  Service: Pulmonary;;  .  FINE NEEDLE ASPIRATION  11/12/2019   Procedure: FINE NEEDLE ASPIRATION (FNA) LINEAR;  Surgeon: [name], DO;  Location: [facility];  Service: Pulmonary;;  . NOSE SURGERY    . Skin cancer removed    . TONSILLECTOMY    . TRANSURETHRAL RESECTION OF PROSTATE    . VIDEO BRONCHOSCOPY WITH ENDOBRONCHIAL NAVIGATION N/A 11/12/2019   Procedure: VIDEO BRONCHOSCOPY WITH ENDOBRONCHIAL NAVIGATION;  Surgeon: [name], DO;  Location: [place];  Service: Pulmonary;  Laterality: N/A;  . XI ROBOTIC ASSISTED SIMPLE PROSTATECTOMY N/A 12/14/2017   Procedure:  XI ROBOTIC ASSISTED SIMPLE PROSTATECTOMY;  Surgeon: Cleon Gustin, MD;  Location: [facility];  Service: Urology;  Laterality: N/A;    Social History   Socioeconomic History  . Marital status: Married    Spouse name: Not on file  . Number of children: 6  . Years of education: Not on file  . Highest education level: Not on file  Occupational History    Comment: Engineer  Tobacco Use  . Smoking status: Former Smoker    Packs/day: 1.00    Years: 30.00    Pack years: 30.00    Types: Cigarettes    Quit date: 10/17/1990    Years since quitting: 29.8  . Smokeless tobacco: Never Used  Vaping Use  . Vaping Use: Never used  Substance and Sexual Activity  . Alcohol use: Yes    Alcohol/week: 1.0 - 2.0 standard drink    Types: 1 - 2 Glasses of wine per week    Comment: every other day  . Drug use: No  . Sexual activity: Not on file  Other Topics Concern  . Not on file  Social History Narrative  . Not on file   Social Determinants of Health   Financial Resource Strain: Not on file  Food Insecurity: Not on file  Transportation Needs: Not on file  Physical Activity: Not on file  Stress: Not on file  Social Connections: Not on file  Intimate Partner Violence: Not on file     Allergies  Allergen Reactions  . Latex Rash    Mild rash     Outpatient Medications Prior to Visit  Medication Sig Dispense Refill  . amLODipine (NORVASC) 10 MG tablet TAKE ONE TABLET BY MOUTH DAILY 90 tablet 3  . apixaban (ELIQUIS) 2.5 MG TABS tablet Take 1 tablet (2.5 mg total) by mouth 2 (two) times daily. 60 tablet 5  . ascorbic acid (VITAMIN C) 500 MG tablet Take 500 mg by mouth every evening.    . cholecalciferol (VITAMIN D3) 25 MCG (1000 UNIT) tablet Take 1,000 Units by mouth in the morning and at bedtime.    . Investigational - Study Medication Take 1 tablet by mouth daily. Preventable study drug: Atorvastatin 40 mg or placebo    . magnesium hydroxide (MILK OF MAGNESIA) 400 MG/5ML suspension Take  15 mLs by mouth daily as needed for mild constipation.    . metoprolol succinate (TOPROL-XL) 50 MG 24 hr tablet TAKE ONE TABLET BY MOUTH DAILY; OFFICE VISIT NEEDED FOR MORE REFILLS 90 tablet 3  . traMADol (ULTRAM) 50 MG tablet Take 1 tablet (50 mg total) by mouth every 6 (six) hours as needed. (Patient not taking: Reported on 08/17/2020) 10 tablet 0   No facility-administered medications prior to visit.    Review of Systems  Constitutional: Negative for chills, fever, malaise/fatigue and weight loss.  HENT: Negative for hearing loss, sore throat and tinnitus.   Eyes: Negative for blurred vision  and double vision.  Respiratory: Negative for cough, hemoptysis, sputum production, shortness of breath, wheezing and stridor.   Cardiovascular: Negative for chest pain, palpitations, orthopnea, leg swelling and PND.  Gastrointestinal: Negative for abdominal pain, constipation, diarrhea, heartburn, nausea and vomiting.  Genitourinary: Negative for dysuria, hematuria and urgency.  Musculoskeletal: Negative for joint pain and myalgias.  Skin: Negative for itching and rash.  Neurological: Negative for dizziness, tingling, weakness and headaches.  Endo/Heme/Allergies: Negative for environmental allergies. Does not bruise/bleed easily.  Psychiatric/Behavioral: Negative for depression. The patient is not nervous/anxious and does not have insomnia.   All other systems reviewed and are negative.    Objective:  Physical Exam Vitals reviewed.  Constitutional:      General: He is not in acute distress.    Appearance: He is well-developed and well-nourished.  HENT:     Head: Normocephalic and atraumatic.     Mouth/Throat:     Mouth: Oropharynx is clear and moist.  Eyes:     General: No scleral icterus.    Conjunctiva/sclera: Conjunctivae normal.     Pupils: Pupils are equal, round, and reactive to light.  Neck:     Vascular: No JVD.     Trachea: No tracheal deviation.  Cardiovascular:     Rate and  Rhythm: Normal rate and regular rhythm.     Pulses: Intact distal pulses.     Heart sounds: Normal heart sounds. No murmur heard.   Pulmonary:     Effort: Pulmonary effort is normal. No tachypnea, accessory muscle usage or respiratory distress.     Breath sounds: Normal breath sounds. No stridor. No wheezing, rhonchi or rales.  Abdominal:     General: Bowel sounds are normal. There is no distension.     Palpations: Abdomen is soft.     Tenderness: There is no abdominal tenderness.  Musculoskeletal:        General: No tenderness or edema.     Cervical back: Neck supple.  Lymphadenopathy:     Cervical: No cervical adenopathy.  Skin:    General: Skin is warm and dry.     Capillary Refill: Capillary refill takes less than 2 seconds.     Findings: No rash.  Neurological:     Mental Status: He is alert and oriented to person, place, and time.  Psychiatric:        Mood and Affect: Mood and affect normal.        Behavior: Behavior normal.      Vitals:   08/17/20 1433  BP: 104/64  Pulse: 64  Temp: (!) 97.3 F (36.3 C)  TempSrc: Tympanic  SpO2: 94%  Weight: 193 lb 6 oz (87.7 kg)  Height: 6\' 1"  (1.854 m)   94% on RA BMI Readings from Last 3 Encounters:  08/17/20 25.51 kg/m  06/17/20 25.20 kg/m  06/15/20 25.75 kg/m   Wt Readings from Last 3 Encounters:  08/17/20 193 lb 6 oz (87.7 kg)  06/17/20 191 lb (86.6 kg)  06/15/20 195 lb 3.2 oz (88.5 kg)     CBC    Component Value Date/Time   WBC 7.4 06/17/2020 1333   RBC 4.64 06/17/2020 1333   HGB 15.0 06/17/2020 1333   HGB 14.4 10/16/2019 1003   HCT 45.2 06/17/2020 1333   HCT 44.3 10/16/2019 1003   PLT 260 06/17/2020 1333   PLT 227 10/16/2019 1003   MCV 97.4 06/17/2020 1333   MCV 96 10/16/2019 1003   MCH 32.3 06/17/2020 1333   MCHC 33.2 06/17/2020  1333   RDW 13.4 06/17/2020 1333   RDW 12.0 10/16/2019 1003   LYMPHSABS 1.9 06/17/2020 1333   MONOABS 0.9 06/17/2020 1333   EOSABS 0.1 06/17/2020 1333   BASOSABS 0.1  06/17/2020 1333     Chest Imaging: CT chest 10/23/2019: Spiculated 3 cm left upper lobe lung mass concerning for primary bronchogenic carcinoma. The patient's images have been independently reviewed by me.    CT chest June 10, 2020: Radiation changes to the left upper lobe.  No evidence of metastatic disease.  Small left-sided dependent effusion evidence of emphysema. The patient's images have been independently reviewed by me.     Pulmonary Functions Testing Results: PFT Results Latest Ref Rng & Units 12/05/2019  FVC-Pre L 4.19  FVC-Predicted Pre % 96  FVC-Post L 4.33  FVC-Predicted Post % 100  Pre FEV1/FVC % % 59  Post FEV1/FCV % % 66  FEV1-Pre L 2.47  FEV1-Predicted Pre % 80  FEV1-Post L 2.84  DLCO uncorrected ml/min/mmHg 15.80  DLCO UNC% % 60  DLVA Predicted % 64  TLC L 7.66  TLC % Predicted % 99  RV % Predicted % 118    FeNO: none   Pathology:  pathology: Consistent with squamous cell carcinoma of the lung.  Echocardiogram:   Study Conclusions   - Left ventricle: The cavity size was normal. Wall thickness was  increased in a pattern of moderate LVH. Systolic function was  normal. The estimated ejection fraction was in the range of 55%  to 60%. Wall motion was normal; there were no regional wall  motion abnormalities. Features are consistent with a pseudonormal  left ventricular filling pattern, with concomitant abnormal  relaxation and increased filling pressure (grade 2 diastolic  dysfunction).  - Aortic valve: Transvalvular velocity was minimally increased.  There was no stenosis. Peak velocity (S): 214 cm/s.  - Mitral valve: There was mild regurgitation.  - Left atrium: The atrium was mildly dilated.  - Tricuspid valve: There was mild regurgitation.  - Pulmonary arteries: Systolic pressure was mildly increased. PA  peak pressure: 34 mm Hg (S).   Heart Catheterization: none     Assessment & Plan:      ICD-10-CM   1. Primary  cancer of left upper lobe of lung ([place])  C34.12   2. Pulmonary emphysema, unspecified emphysema type ([place])  J43.9   3. Squamous cell carcinoma of left lung (HCC)  C34.92   4. Former smoker  Z87.891   5. Centrilobular emphysema ([place])  J43.2   6. History of radiation exposure  Z92.3     Discussion:  This is an 87 year old gentleman, evidence of centrilobular emphysema on CT, new 3.4 cm left upper lobe mass diagnosed with squamous cell carcinoma of the lung following bronchoscopy.  Patient was referred to thoracic surgery for evaluation and ultimately settled with radiation therapy as his treatment modality.  Patient establish care with radiation oncology and has received 5 cycles SBRT.  Recently developed pleurisy type symptoms which were treated and now resolved.  From a COPD standpoint he has mild obstruction currently not on a scheduled maintenance inhaler regimen.  Patient would prefer not to be on medications if not needed.  At this time has no significant limitations from a respiratory standpoint.  Plan: Recommend routine image follow-up. Repeat scan is already been ordered by radiation oncology. Patient can follow-up with [place] after repeat images if needed.  We reviewed his recent ER visit and recent images today in the office.    Current  Outpatient Medications:  .  amLODipine (NORVASC) 10 MG tablet, TAKE ONE TABLET BY MOUTH DAILY, Disp: 90 tablet, Rfl: 3 .  apixaban (ELIQUIS) 2.5 MG TABS tablet, Take 1 tablet (2.5 mg total) by mouth 2 (two) times daily., Disp: 60 tablet, Rfl: 5 .  ascorbic acid (VITAMIN C) 500 MG tablet, Take 500 mg by mouth every evening., Disp: , Rfl:  .  cholecalciferol (VITAMIN D3) 25 MCG (1000 UNIT) tablet, Take 1,000 Units by mouth in the morning and at bedtime., Disp: , Rfl:  .  Investigational - Study Medication, Take 1 tablet by mouth daily. Preventable study drug: Atorvastatin 40 mg or placebo, Disp: , Rfl:  .  magnesium hydroxide (MILK OF MAGNESIA) 400  MG/5ML suspension, Take 15 mLs by mouth daily as needed for mild constipation., Disp: , Rfl:  .  metoprolol succinate (TOPROL-XL) 50 MG 24 hr tablet, TAKE ONE TABLET BY MOUTH DAILY; OFFICE VISIT NEEDED FOR MORE REFILLS, Disp: 90 tablet, Rfl: 3  I spent 31 minutes dedicated to the care of this patient on the date of this encounter to include pre-visit review of records, face-to-face time with the patient discussing conditions above, post visit ordering of testing, clinical documentation with the electronic health record, making appropriate referrals as documented, and communicating necessary findings to members of the patients care team.    [name], DO [facility] 08/17/2020 2:56 PM

## 2020-08-26 ENCOUNTER — Encounter: Payer: Self-pay | Admitting: Cardiology

## 2020-08-26 ENCOUNTER — Other Ambulatory Visit: Payer: Self-pay

## 2020-08-26 ENCOUNTER — Ambulatory Visit: Payer: Medicare PPO | Admitting: Cardiology

## 2020-08-26 VITALS — BP 112/60 | HR 66 | Ht 73.0 in | Wt 192.0 lb

## 2020-08-26 DIAGNOSIS — I4821 Permanent atrial fibrillation: Secondary | ICD-10-CM

## 2020-08-26 DIAGNOSIS — R002 Palpitations: Secondary | ICD-10-CM | POA: Diagnosis not present

## 2020-08-26 DIAGNOSIS — I10 Essential (primary) hypertension: Secondary | ICD-10-CM | POA: Diagnosis not present

## 2020-08-26 NOTE — Patient Instructions (Signed)

## 2020-09-03 DIAGNOSIS — I10 Essential (primary) hypertension: Secondary | ICD-10-CM | POA: Diagnosis not present

## 2020-09-03 DIAGNOSIS — Z0000 Encounter for general adult medical examination without abnormal findings: Secondary | ICD-10-CM | POA: Diagnosis not present

## 2020-09-14 DIAGNOSIS — N261 Atrophy of kidney (terminal): Secondary | ICD-10-CM | POA: Diagnosis not present

## 2020-09-14 DIAGNOSIS — N400 Enlarged prostate without lower urinary tract symptoms: Secondary | ICD-10-CM | POA: Diagnosis not present

## 2020-09-14 DIAGNOSIS — N323 Diverticulum of bladder: Secondary | ICD-10-CM | POA: Diagnosis not present

## 2020-09-14 DIAGNOSIS — N1832 Chronic kidney disease, stage 3b: Secondary | ICD-10-CM | POA: Diagnosis not present

## 2020-09-28 DIAGNOSIS — C44319 Basal cell carcinoma of skin of other parts of face: Secondary | ICD-10-CM | POA: Diagnosis not present

## 2020-09-28 DIAGNOSIS — C4441 Basal cell carcinoma of skin of scalp and neck: Secondary | ICD-10-CM | POA: Diagnosis not present

## 2020-10-06 ENCOUNTER — Other Ambulatory Visit: Payer: Self-pay | Admitting: Cardiology

## 2020-10-06 DIAGNOSIS — I480 Paroxysmal atrial fibrillation: Secondary | ICD-10-CM

## 2020-10-15 DIAGNOSIS — I480 Paroxysmal atrial fibrillation: Secondary | ICD-10-CM | POA: Diagnosis not present

## 2020-10-15 DIAGNOSIS — I129 Hypertensive chronic kidney disease with stage 1 through stage 4 chronic kidney disease, or unspecified chronic kidney disease: Secondary | ICD-10-CM | POA: Diagnosis not present

## 2020-10-15 DIAGNOSIS — N261 Atrophy of kidney (terminal): Secondary | ICD-10-CM | POA: Diagnosis not present

## 2020-10-15 DIAGNOSIS — Z87891 Personal history of nicotine dependence: Secondary | ICD-10-CM | POA: Diagnosis not present

## 2020-10-15 DIAGNOSIS — N400 Enlarged prostate without lower urinary tract symptoms: Secondary | ICD-10-CM | POA: Diagnosis not present

## 2020-10-15 DIAGNOSIS — Z7901 Long term (current) use of anticoagulants: Secondary | ICD-10-CM | POA: Diagnosis not present

## 2020-10-15 DIAGNOSIS — C3412 Malignant neoplasm of upper lobe, left bronchus or lung: Secondary | ICD-10-CM | POA: Diagnosis not present

## 2020-10-15 DIAGNOSIS — N1832 Chronic kidney disease, stage 3b: Secondary | ICD-10-CM | POA: Diagnosis not present

## 2020-10-16 DIAGNOSIS — N1832 Chronic kidney disease, stage 3b: Secondary | ICD-10-CM | POA: Diagnosis not present

## 2020-10-19 ENCOUNTER — Telehealth: Payer: Self-pay | Admitting: Cardiology

## 2020-10-19 MED ORDER — METOPROLOL SUCCINATE ER 50 MG PO TB24
ORAL_TABLET | ORAL | 1 refills | Status: DC
Start: 2020-10-19 — End: 2021-04-19

## 2020-10-19 NOTE — Telephone Encounter (Signed)
*  STAT* If patient is at the pharmacy, call can be transferred to refill team.   1. Which medications need to be refilled? (please list name of each medication and dose if known) metoprolol succinate (TOPROL-XL) 50 MG 24 hr tablet  2. Which pharmacy/location (including street and city if local pharmacy) is medication to be sent to? [facility] [place], [place] [address]  3. Do they need a 30 day or 90 day supply? 90 day supply

## 2020-10-20 DIAGNOSIS — H26493 Other secondary cataract, bilateral: Secondary | ICD-10-CM | POA: Diagnosis not present

## 2020-10-20 DIAGNOSIS — H524 Presbyopia: Secondary | ICD-10-CM | POA: Diagnosis not present

## 2020-10-20 DIAGNOSIS — Z135 Encounter for screening for eye and ear disorders: Secondary | ICD-10-CM | POA: Diagnosis not present

## 2020-10-20 DIAGNOSIS — H35033 Hypertensive retinopathy, bilateral: Secondary | ICD-10-CM | POA: Diagnosis not present

## 2020-11-09 DIAGNOSIS — N400 Enlarged prostate without lower urinary tract symptoms: Secondary | ICD-10-CM | POA: Diagnosis not present

## 2020-11-09 DIAGNOSIS — R8279 Other abnormal findings on microbiological examination of urine: Secondary | ICD-10-CM | POA: Diagnosis not present

## 2020-12-08 ENCOUNTER — Encounter: Payer: Self-pay | Admitting: Radiology

## 2020-12-15 ENCOUNTER — Ambulatory Visit (HOSPITAL_COMMUNITY): Payer: Medicare PPO

## 2020-12-16 NOTE — Progress Notes (Signed)
Radiation Oncology         [phone]) [phone] ________________________________  Name: Richard Vincent. MRN: [id number]  Date: 12/21/2020  DOB: 11/28/1931  Follow-Up Visit Note  CC: [name], MD  [name], DO   Diagnosis: Stage IA3 (T1c,N0, M0) squamous cell carcinoma of the left upper lobe  Interval Since Last Radiation: 11 months  Radiation Treatment Dates: 01/29/2020 through 02/07/2020 Site Technique Total Dose (Gy) Dose per Fx (Gy) Completed Fx Beam Energies  Lung, Left: Lung_Lt IMRT 60/60 12 5/5 6XFFF    Narrative:  The patient returns today for routine follow-up. Since his last visit, he was seen in the ED [facility]) on 06/17/2020 presenting with intermittent left-sided chest pain that was radiating down to his left arm for several days.  He stated that there was no particular pattern and that it seemed to happen randomly.  He denied any exertional chest pain or shortness of breath or pleuritic chest pain either. The patient was discharged the same day and was sent home with tramadol.   Chest x-ray received during ED visit on 06/17/20 showed left lung lingular mass with surrounding radiation change and small pleural effusion.    It is worth noting that the patient consulted with Nephrology at [facility] on 10/15/20 in regards to his Dx of Stage 3b chronic kidney disease (however no further information is available from patient chart in regards to this Dx).   Chest CT taken on 12/18/20 showed that there was a mild reduction in the volume of the bandlike density corresponding to the region of prior radiation therapy in the lingula. It was noted that the separate nodule was not distinctly visible in the scan, and no new adenopathy or new lesions were identified.   Patient reports no new medical issues since last follow-up.  He has some dyspnea with exertion which is been chronic.  He denies any pain within the chest area significant cough or hemoptysis.  Is hard  of hearing and will be getting a hearing aid later this week.   ALLERGIES:  is allergic to latex.  Meds: Current Outpatient Medications  Medication Sig Dispense Refill  . amLODipine (NORVASC) 10 MG tablet TAKE ONE TABLET BY MOUTH DAILY 90 tablet 3  . ascorbic acid (VITAMIN C) 500 MG tablet Take 500 mg by mouth every evening.    . cholecalciferol (VITAMIN D3) 25 MCG (1000 UNIT) tablet Take 1,000 Units by mouth in the morning and at bedtime.    Marland Kitchen ELIQUIS 2.5 MG TABS tablet TAKE ONE TABLET BY MOUTH TWICE A DAY 180 tablet 1  . Investigational - Study Medication Take 1 tablet by mouth daily. Preventable study drug: Atorvastatin 40 mg or placebo    . metoprolol succinate (TOPROL-XL) 50 MG 24 hr tablet TAKE ONE TABLET BY MOUTH DAILY 90 tablet 1  . OVER THE COUNTER MEDICATION Take 8 g by mouth daily. Psyllium husk fiber     No current facility-administered medications for this encounter.    Physical Findings: The patient is in no acute distress. Patient is alert and oriented.  height is 6\' 1"  (1.854 m) and weight is 188 lb 8 oz (85.5 kg). His temporal temperature is 96.8 F (36 C) (abnormal). His blood pressure is 120/71 and his pulse is 47 (abnormal). His respiration is 18 and oxygen saturation is 100%.   Lungs are clear to auscultation bilaterally. Heart has regular rate and rhythm. No palpable cervical, supraclavicular, or axillary adenopathy. Abdomen soft, non-tender, normal  bowel sounds.     Lab Findings: Lab Results  Component Value Date   WBC 7.4 06/17/2020   HGB 15.0 06/17/2020   HCT 45.2 06/17/2020   MCV 97.4 06/17/2020   PLT 260 06/17/2020    Radiographic Findings: CT Chest Wo Contrast  Result Date: 12/19/2020 CLINICAL DATA:  Non-small cell lung cancer restaging. Prior radiation therapy. EXAM: CT CHEST WITHOUT CONTRAST TECHNIQUE: Multidetector CT imaging of the chest was performed following the standard protocol without IV contrast. COMPARISON:  06/11/2019 FINDINGS:  Cardiovascular: Coronary, aortic arch, and branch vessel atherosclerotic vascular disease. Mild aortic valve calcifications. Mild cardiomegaly. Mediastinum/Nodes: Scattered small mediastinal lymph nodes are not pathologically enlarged and appear similar to prior. Index right lower paratracheal lymph node 0.8 cm in short axis, image 62 series 2. Small type 1 hiatal hernia. Lungs/Pleura: Stable peripheral interstitial accentuation in scarring laterally in the right middle lobe. Centrilobular emphysema. Stable 2 mm subpleural nodule in the superior segment right lower lobe, image 64 series 5. Bandlike volume loss in the lingula in the vicinity of the prior treated nodule, with fiducial markers along the upper margin of this bandlike density. The nodule itself is not distinctly visible separate from the bandlike density. Overall the density is moderately reduced in size compared to 06/10/2020. Prior left pleural effusion has resolved. Upper Abdomen: Scattered hypodense lesions in the left hepatic lobe favoring cysts, similar to prior. Contracted gallbladder is filled with small gallstones. Severe atrophy of the right kidney with exophytic fluid density right renal cysts noted. Musculoskeletal: Thoracic spondylosis. IMPRESSION: 1. Mild reduction in volume of the bandlike density corresponding to region of prior radiation therapy in the lingula. Separate nodule not distinctly visible. No new adenopathy or new lesions identified. 2. Other imaging findings of potential clinical significance: Aortic Atherosclerosis (ICD10-I70.0) and Emphysema (ICD10-J43.9). Coronary atherosclerosis. Aortic valve calcifications. Mild cardiomegaly. Small type 1 hiatal hernia. Hepatic and right renal cysts. Severe right renal atrophy. Cholelithiasis. Electronically Signed   By: Van Clines M.D.   On: 12/19/2020 08:41    Impression: Stage IA3 (T1c,N0, M0) squamous cell carcinoma of the left upper lobe  No evidence of recurrence on  clinical exam..  Recent chest CT scan shows a favorable response to this SBRT.  No new chest CT findings.  We reviewed his chest CT report in detail and he is pleased with results.  Plan: The patient will follow-up with radiation oncology in 6 months.  Prior to this follow-up the patient will have a CT scan of the chest  Total time spent in this encounter was 20 minutes which included reviewing the patient's most recent ED visit, chest x-rays, chest CT scan, follow-ups, physical examination, and documentation.  ____________________________________   Blair Promise, PhD, MD  This document serves as a record of services personally performed by Gery Pray, MD. It was created on his behalf by Roney Mans, a trained medical scribe. The creation of this record is based on the scribe's personal observations and the provider's statements to them. This document has been checked and approved by the attending provider.

## 2020-12-18 ENCOUNTER — Ambulatory Visit (HOSPITAL_COMMUNITY)
Admission: RE | Admit: 2020-12-18 | Discharge: 2020-12-18 | Disposition: A | Discharge status: Home / Self Care | Payer: Medicare PPO | Source: Ambulatory Visit | Attending: Radiation Oncology | Admitting: Radiation Oncology

## 2020-12-18 ENCOUNTER — Other Ambulatory Visit: Payer: Self-pay

## 2020-12-18 DIAGNOSIS — J432 Centrilobular emphysema: Secondary | ICD-10-CM | POA: Diagnosis not present

## 2020-12-18 DIAGNOSIS — C3490 Malignant neoplasm of unspecified part of unspecified bronchus or lung: Secondary | ICD-10-CM | POA: Diagnosis not present

## 2020-12-18 DIAGNOSIS — I2510 Atherosclerotic heart disease of native coronary artery without angina pectoris: Secondary | ICD-10-CM | POA: Diagnosis not present

## 2020-12-18 DIAGNOSIS — K449 Diaphragmatic hernia without obstruction or gangrene: Secondary | ICD-10-CM | POA: Diagnosis not present

## 2020-12-18 DIAGNOSIS — C3412 Malignant neoplasm of upper lobe, left bronchus or lung: Secondary | ICD-10-CM | POA: Diagnosis not present

## 2020-12-21 ENCOUNTER — Encounter: Payer: Self-pay | Admitting: Radiation Oncology

## 2020-12-21 ENCOUNTER — Other Ambulatory Visit: Payer: Self-pay

## 2020-12-21 ENCOUNTER — Ambulatory Visit
Admission: RE | Admit: 2020-12-21 | Discharge: 2020-12-21 | Disposition: A | Discharge status: Home / Self Care | Payer: Medicare PPO | Source: Ambulatory Visit | Attending: Radiation Oncology | Admitting: Radiation Oncology

## 2020-12-21 VITALS — BP 120/71 | HR 47 | Temp 96.8°F | Resp 18 | Ht 73.0 in | Wt 188.5 lb

## 2020-12-21 DIAGNOSIS — Z7901 Long term (current) use of anticoagulants: Secondary | ICD-10-CM | POA: Diagnosis not present

## 2020-12-21 DIAGNOSIS — K449 Diaphragmatic hernia without obstruction or gangrene: Secondary | ICD-10-CM | POA: Insufficient documentation

## 2020-12-21 DIAGNOSIS — I517 Cardiomegaly: Secondary | ICD-10-CM | POA: Diagnosis not present

## 2020-12-21 DIAGNOSIS — J439 Emphysema, unspecified: Secondary | ICD-10-CM | POA: Diagnosis not present

## 2020-12-21 DIAGNOSIS — Z08 Encounter for follow-up examination after completed treatment for malignant neoplasm: Secondary | ICD-10-CM | POA: Diagnosis not present

## 2020-12-21 DIAGNOSIS — Z923 Personal history of irradiation: Secondary | ICD-10-CM | POA: Insufficient documentation

## 2020-12-21 DIAGNOSIS — I2510 Atherosclerotic heart disease of native coronary artery without angina pectoris: Secondary | ICD-10-CM | POA: Diagnosis not present

## 2020-12-21 DIAGNOSIS — Z79899 Other long term (current) drug therapy: Secondary | ICD-10-CM | POA: Insufficient documentation

## 2020-12-21 DIAGNOSIS — Z85118 Personal history of other malignant neoplasm of bronchus and lung: Secondary | ICD-10-CM | POA: Insufficient documentation

## 2020-12-21 DIAGNOSIS — J90 Pleural effusion, not elsewhere classified: Secondary | ICD-10-CM | POA: Diagnosis not present

## 2020-12-21 DIAGNOSIS — N281 Cyst of kidney, acquired: Secondary | ICD-10-CM | POA: Diagnosis not present

## 2020-12-21 DIAGNOSIS — M47894 Other spondylosis, thoracic region: Secondary | ICD-10-CM | POA: Insufficient documentation

## 2020-12-21 DIAGNOSIS — C3412 Malignant neoplasm of upper lobe, left bronchus or lung: Secondary | ICD-10-CM

## 2020-12-21 NOTE — Progress Notes (Signed)
Richard Vincent. is here today for follow up post radiation to the lung.  Lung Side: Left,patient completed radiation on 02/07/20  Does the patient complain of any of the following: . Pain: Patient denies pain.  Marland Kitchen Shortness of breath w/wo exertion: Patient reports having some shortness of breath on exertion.  . Cough: Productive  . Hemoptysis: no . Pain with swallowing: no . Swallowing/choking concerns: no . Appetite: Fair . Energy Level: Good energy level  . Post radiation skin Changes: no    Additional comments if applicable:  Vitals:   09/81/19 1021  BP: 120/71  Pulse: (!) 47  Resp: 18  Temp: (!) 96.8 F (36 C)  TempSrc: Temporal  SpO2: 100%  Weight: 188 lb 8 oz (85.5 kg)  Height: 6\' 1"  (1.854 m)

## 2021-02-08 DIAGNOSIS — R8279 Other abnormal findings on microbiological examination of urine: Secondary | ICD-10-CM | POA: Diagnosis not present

## 2021-02-08 DIAGNOSIS — N400 Enlarged prostate without lower urinary tract symptoms: Secondary | ICD-10-CM | POA: Diagnosis not present

## 2021-02-17 NOTE — Progress Notes (Signed)
HPI: FU atrial fibrillation. Nuclear study December 2016 showed ejection fraction 53%, prior infarct but no ischemia. Patient found to be in atrial fibrillation following surgical procedure in July 2018 at [facility]. Echo July 2018 showed normal LV function, grade 2 diastolic dysfunction, mild mitral regurgitation and mild left atrial enlargement. Plan was to proceed with cardioversion if atrial fibrillation persisted as this may have been postoperative atrial fibrillation.  However he converted spontaneously but atrial fibrillation recurred.  Patient had [facility] screening in September 2019 that showed mild carotid artery disease bilaterally, no abdominal aortic aneurysm and normal ABIs.  Since I last saw him, he has some dyspnea on exertion but no orthopnea, PND, pedal edema, chest pain, palpitations, syncope or bleeding.  Current Outpatient Medications  Medication Sig Dispense Refill   amLODipine (NORVASC) 10 MG tablet TAKE ONE TABLET BY MOUTH DAILY 90 tablet 3   ascorbic acid (VITAMIN C) 500 MG tablet Take 500 mg by mouth every evening.     cholecalciferol (VITAMIN D3) 25 MCG (1000 UNIT) tablet Take 1,000 Units by mouth in the morning and at bedtime.     ELIQUIS 2.5 MG TABS tablet TAKE ONE TABLET BY MOUTH TWICE A DAY 180 tablet 1   Investigational - Study Medication Take 1 tablet by mouth daily. Preventable study drug: Atorvastatin 40 mg or placebo     metoprolol succinate (TOPROL-XL) 50 MG 24 hr tablet TAKE ONE TABLET BY MOUTH DAILY 90 tablet 1   OVER THE COUNTER MEDICATION Take 8 g by mouth daily. Psyllium husk fiber     No current facility-administered medications for this visit.     Past Medical History:  Diagnosis Date   Asthma    childhood    Basal cell cancer    nose   BPH (benign prostatic hyperplasia)    GERD (gastroesophageal reflux disease)    occ   Gout    changed diet years ago ; no gout attacks since    History of radiation therapy 01/29/2020-02/07/2020    SBRT to left lung       Dr Gery Pray   Hypertension    Lung cancer [facility]) 2021   Paroxysmal A-fib ([place])    occurred post-operatively ; last EKG he was in NSR    Renal insufficiency    non problematic     Past Surgical History:  Procedure Laterality Date   BRONCHIAL BIOPSY  11/12/2019   Procedure: BRONCHIAL BIOPSIES;  Surgeon: [name], DO;  Location: [place] [facility];  Service: Pulmonary;;   BRONCHIAL BRUSHINGS  11/12/2019   Procedure: BRONCHIAL BRUSHINGS;  Surgeon: [name], DO;  Location: [place] [facility];  Service: Pulmonary;;   BRONCHIAL WASHINGS  11/12/2019   Procedure: BRONCHIAL WASHINGS;  Surgeon: [name], DO;  Location: [place] [facility];  Service: Pulmonary;;   ENDOBRONCHIAL ULTRASOUND  11/12/2019   Procedure: ENDOBRONCHIAL ULTRASOUND;  Surgeon: [name], DO;  Location: [place] [facility];  Service: Pulmonary;;   EYE SURGERY  about 5-6 years ago    cataracts bilateral    FIDUCIAL MARKER PLACEMENT  11/12/2019   Procedure: FIDUCIAL MARKER PLACEMENT;  Surgeon: [name], DO;  Location: [place];  Service: Pulmonary;;   FINE NEEDLE ASPIRATION  11/12/2019   Procedure: FINE NEEDLE ASPIRATION (FNA) LINEAR;  Surgeon: [name], DO;  Location: [place] [facility];  Service: Pulmonary;;   NOSE SURGERY     Skin cancer removed     TONSILLECTOMY     TRANSURETHRAL RESECTION OF PROSTATE  VIDEO BRONCHOSCOPY WITH ENDOBRONCHIAL NAVIGATION N/A 11/12/2019   Procedure: VIDEO BRONCHOSCOPY WITH ENDOBRONCHIAL NAVIGATION;  Surgeon: [name], DO;  Location: [place];  Service: Pulmonary;  Laterality: N/A;   XI ROBOTIC ASSISTED SIMPLE PROSTATECTOMY N/A 12/14/2017   Procedure: XI ROBOTIC ASSISTED SIMPLE PROSTATECTOMY;  Surgeon: Cleon Gustin, MD;  Location: [facility];  Service: Urology;  Laterality: N/A;    Social History   Socioeconomic History   Marital status: Married    Spouse name: Not on file   Number of children: 6   Years of education: Not on file    Highest education level: Not on file  Occupational History    Comment: Engineer  Tobacco Use   Smoking status: Former    Packs/day: 1.00    Years: 30.00    Pack years: 30.00    Types: Cigarettes    Quit date: 10/17/1990    Years since quitting: 30.3   Smokeless tobacco: Never  Vaping Use   Vaping Use: Never used  Substance and Sexual Activity   Alcohol use: Yes    Alcohol/week: 1.0 - 2.0 standard drink    Types: 1 - 2 Glasses of wine per week    Comment: every other day   Drug use: No   Sexual activity: Not on file  Other Topics Concern   Not on file  Social History Narrative   Not on file   Social Determinants of Health   Financial Resource Strain: Not on file  Food Insecurity: Not on file  Transportation Needs: Not on file  Physical Activity: Not on file  Stress: Not on file  Social Connections: Not on file  Intimate Partner Violence: Not on file    Family History  Problem Relation Age of Onset   Coronary artery disease Other        No family history    ROS: no fevers or chills, productive cough, hemoptysis, dysphasia, odynophagia, melena, hematochezia, dysuria, hematuria, rash, seizure activity, orthopnea, PND, pedal edema, claudication. Remaining systems are negative.  Physical Exam: Well-developed well-nourished in no acute distress.  Skin is warm and dry.  HEENT is normal.  Neck is supple.  Chest is clear to auscultation with normal expansion.  Cardiovascular exam is irregular Abdominal exam nontender or distended. No masses palpated. Extremities show no edema. neuro grossly intact   A/P  1 permanent atrial fibrillation-we will continue beta-blocker at present dose for rate control.  Continue apixaban.  2 hypertension-blood pressure controlled.  Continue present medical regimen.  3 history of palpitations-no recent symptoms.  Continue beta-blocker.  Kirk Ruths, MD

## 2021-02-22 DIAGNOSIS — L578 Other skin changes due to chronic exposure to nonionizing radiation: Secondary | ICD-10-CM | POA: Diagnosis not present

## 2021-02-22 DIAGNOSIS — L570 Actinic keratosis: Secondary | ICD-10-CM | POA: Diagnosis not present

## 2021-02-24 ENCOUNTER — Ambulatory Visit: Payer: Medicare PPO | Admitting: Cardiology

## 2021-02-24 ENCOUNTER — Other Ambulatory Visit: Payer: Self-pay

## 2021-02-24 ENCOUNTER — Encounter: Payer: Self-pay | Admitting: Cardiology

## 2021-02-24 VITALS — BP 124/54 | HR 53 | Ht 73.0 in | Wt 187.4 lb

## 2021-02-24 DIAGNOSIS — I4821 Permanent atrial fibrillation: Secondary | ICD-10-CM | POA: Diagnosis not present

## 2021-02-24 DIAGNOSIS — R002 Palpitations: Secondary | ICD-10-CM | POA: Diagnosis not present

## 2021-02-24 DIAGNOSIS — I10 Essential (primary) hypertension: Secondary | ICD-10-CM | POA: Diagnosis not present

## 2021-02-24 NOTE — Patient Instructions (Signed)

## 2021-03-05 DIAGNOSIS — I10 Essential (primary) hypertension: Secondary | ICD-10-CM | POA: Diagnosis not present

## 2021-03-05 DIAGNOSIS — Z87898 Personal history of other specified conditions: Secondary | ICD-10-CM | POA: Diagnosis not present

## 2021-03-05 DIAGNOSIS — J432 Centrilobular emphysema: Secondary | ICD-10-CM | POA: Diagnosis not present

## 2021-03-05 DIAGNOSIS — H6123 Impacted cerumen, bilateral: Secondary | ICD-10-CM | POA: Diagnosis not present

## 2021-03-05 DIAGNOSIS — Z23 Encounter for immunization: Secondary | ICD-10-CM | POA: Diagnosis not present

## 2021-03-05 DIAGNOSIS — Z79899 Other long term (current) drug therapy: Secondary | ICD-10-CM | POA: Diagnosis not present

## 2021-03-05 DIAGNOSIS — Z0000 Encounter for general adult medical examination without abnormal findings: Secondary | ICD-10-CM | POA: Diagnosis not present

## 2021-04-11 ENCOUNTER — Other Ambulatory Visit: Payer: Self-pay | Admitting: Cardiology

## 2021-04-11 DIAGNOSIS — I480 Paroxysmal atrial fibrillation: Secondary | ICD-10-CM

## 2021-04-12 NOTE — Telephone Encounter (Signed)
Prescription refill request for Eliquis received. Indication:atrial fib Last office visit:8/22 Scr:1.7 Age: 87 Weight:85 kg  Prescription refilled

## 2021-04-15 DIAGNOSIS — N2581 Secondary hyperparathyroidism of renal origin: Secondary | ICD-10-CM | POA: Diagnosis not present

## 2021-04-15 DIAGNOSIS — N1832 Chronic kidney disease, stage 3b: Secondary | ICD-10-CM | POA: Diagnosis not present

## 2021-04-19 ENCOUNTER — Other Ambulatory Visit: Payer: Self-pay | Admitting: Cardiology

## 2021-04-20 DIAGNOSIS — I129 Hypertensive chronic kidney disease with stage 1 through stage 4 chronic kidney disease, or unspecified chronic kidney disease: Secondary | ICD-10-CM | POA: Diagnosis not present

## 2021-04-20 DIAGNOSIS — R3129 Other microscopic hematuria: Secondary | ICD-10-CM | POA: Diagnosis not present

## 2021-04-20 DIAGNOSIS — Z87891 Personal history of nicotine dependence: Secondary | ICD-10-CM | POA: Diagnosis not present

## 2021-04-20 DIAGNOSIS — N1832 Chronic kidney disease, stage 3b: Secondary | ICD-10-CM | POA: Diagnosis not present

## 2021-04-20 DIAGNOSIS — I480 Paroxysmal atrial fibrillation: Secondary | ICD-10-CM | POA: Diagnosis not present

## 2021-04-20 DIAGNOSIS — R809 Proteinuria, unspecified: Secondary | ICD-10-CM | POA: Diagnosis not present

## 2021-06-22 ENCOUNTER — Ambulatory Visit (HOSPITAL_COMMUNITY)
Admission: RE | Admit: 2021-06-22 | Discharge: 2021-06-22 | Disposition: A | Discharge status: Home / Self Care | Payer: Medicare PPO | Source: Ambulatory Visit | Attending: Radiation Oncology | Admitting: Radiation Oncology

## 2021-06-22 ENCOUNTER — Other Ambulatory Visit: Payer: Self-pay

## 2021-06-22 DIAGNOSIS — C3412 Malignant neoplasm of upper lobe, left bronchus or lung: Secondary | ICD-10-CM | POA: Insufficient documentation

## 2021-06-22 DIAGNOSIS — I700 Atherosclerosis of aorta: Secondary | ICD-10-CM | POA: Diagnosis not present

## 2021-06-22 DIAGNOSIS — C3490 Malignant neoplasm of unspecified part of unspecified bronchus or lung: Secondary | ICD-10-CM | POA: Diagnosis not present

## 2021-06-22 DIAGNOSIS — J439 Emphysema, unspecified: Secondary | ICD-10-CM | POA: Diagnosis not present

## 2021-06-23 NOTE — Progress Notes (Signed)
Radiation Oncology         [phone]) [phone] ________________________________  Name: Richard Vincent. MRN: [id number]  Date: 06/24/2021  DOB: 1932/04/27  Follow-Up Visit Note  CC: [name], MD  [name], DO    ICD-10-CM   1. Primary cancer of left upper lobe of lung ([place])  C34.12 CT CHEST WO CONTRAST    2. Lung mass  R91.8       Diagnosis: Stage IA3 (T1c,N0, M0) squamous cell carcinoma of the left upper lobe   Interval Since Last Radiation: 1 year, 4 months, and 16 days   Radiation Treatment Dates: 01/29/2020 through 02/07/2020 Site Technique Total Dose (Gy) Dose per Fx (Gy) Completed Fx Beam Energies  Lung, Left: Lung_Lt IMRT 60/60 12 5/5 6XFFF    Narrative:  The patient returns today for routine follow-up and to review recent imaging, he was last seen here for follow-up on 12/21/20. Since then, the patient followed up with his cardiologist, Dr. Stanford Breed, on 02/24/21, in regards to his permanent a-fib and hypertension. His a-fib and hypertension were noted as well controlled, and the patient was instructed to continue on his beta-blocker and HTN medications.   Chest CT on 06/22/21 revealed stable post radiation scarring in the lingula, without distinct pulmonary nodule or mass. No evidence of recurrent or metastatic carcinoma was otherwise seen within the thorax.        Otherwise, no significant interval history since the patient was last seen for follow up.      The patient reports he does have some kidney disease and will be seeing a urologist in the [facility] area in the near future.     He denies any pain within the chest area significant cough or hemoptysis.                Allergies:  is allergic to latex.  Meds: Current Outpatient Medications  Medication Sig Dispense Refill   amLODipine (NORVASC) 10 MG tablet TAKE ONE TABLET BY MOUTH DAILY 90 tablet 3   ascorbic acid (VITAMIN C) 500 MG tablet Take 500 mg by mouth every evening.     cholecalciferol  (VITAMIN D3) 25 MCG (1000 UNIT) tablet Take 1,000 Units by mouth in the morning and at bedtime.     ELIQUIS 2.5 MG TABS tablet TAKE ONE TABLET BY MOUTH TWICE A DAY 180 tablet 1   Investigational - Study Medication Take 1 tablet by mouth daily. Preventable study drug: Atorvastatin 40 mg or placebo     metoprolol succinate (TOPROL-XL) 50 MG 24 hr tablet TAKE ONE TABLET BY MOUTH DAILY 90 tablet 1   OVER THE COUNTER MEDICATION Take 8 g by mouth daily. Psyllium husk fiber     No current facility-administered medications for this encounter.    Physical Findings: The patient is in no acute distress. Patient is alert and oriented.  height is 6\' 1"  (1.854 m) and weight is 184 lb 4 oz (83.6 kg). His temporal temperature is 96.8 F (36 C) (abnormal). His blood pressure is 118/61 and his pulse is 57 (abnormal). His respiration is 18 and oxygen saturation is 99%. .   Lungs are clear to auscultation bilaterally. Heart has regular rate and rhythm. No palpable cervical, supraclavicular, or axillary adenopathy. Abdomen soft, non-tender, normal bowel sounds.  Peripheral pulses strong and regular   Lab Findings: Lab Results  Component Value Date   WBC 7.4 06/17/2020   HGB 15.0 06/17/2020   HCT 45.2 06/17/2020   MCV 97.4  06/17/2020   PLT 260 06/17/2020    Radiographic Findings: CT CHEST WO CONTRAST  Result Date: 06/24/2021 CLINICAL DATA:  Follow-up non-small cell lung carcinoma. Status post radiation therapy. EXAM: CT CHEST WITHOUT CONTRAST TECHNIQUE: Multidetector CT imaging of the chest was performed following the standard protocol without IV contrast. COMPARISON:  12/18/2020 FINDINGS: Cardiovascular: No acute findings. Aortic and coronary atherosclerotic calcification noted. Mediastinum/Nodes: No masses or pathologically enlarged lymph nodes identified on this unenhanced exam. Lungs/Pleura: Band like opacity in the lingula shows no significant change since previous study, and no distinct pulmonary nodule  or mass is visualized. This is consistent with post radiation scarring. No other suspicious pulmonary nodules masses are identified. No evidence of pleural effusion. Mild-to-moderate centrilobular emphysema again noted. Upper Abdomen: Normal adrenal glands. Small gallstones again seen, without evidence cholecystitis. Tiny hepatic cysts and right renal cysts remains stable. Diffuse right renal atrophy also unchanged. Musculoskeletal:  No suspicious bone lesions. IMPRESSION: Stable post radiation scarring in lingula, without distinct pulmonary nodule or mass. No evidence of recurrent or metastatic carcinoma within the thorax. Cholelithiasis. No radiographic evidence of cholecystitis. Aortic Atherosclerosis (ICD10-I70.0) and Emphysema (ICD10-J43.9). Electronically Signed   By: Marlaine Hind M.D.   On: 06/24/2021 08:43    Impression:  Stage IA3 (T1c,N0, M0) squamous cell carcinoma of the left upper lobe   The patient tolerated his SBRT well without any lasting effects.  Recent chest CT scan shows favorable response to his SBRT.  Plan: The patient will undergo a CT scan of the chest in 6 months.  Follow-up with me soon after for physical exam and to review the results of this scan.   20 minutes of total time was spent for this patient encounter, including preparation, face-to-face counseling with the patient and coordination of care, physical exam, and documentation of the encounter. ____________________________________  Blair Promise, PhD, MD   This document serves as a record of services personally performed by Gery Pray, MD. It was created on his behalf by Roney Mans, a trained medical scribe. The creation of this record is based on the scribe's personal observations and the provider's statements to them. This document has been checked and approved by the attending provider.

## 2021-06-24 ENCOUNTER — Encounter: Payer: Self-pay | Admitting: Radiation Oncology

## 2021-06-24 ENCOUNTER — Ambulatory Visit
Admission: RE | Admit: 2021-06-24 | Discharge: 2021-06-24 | Disposition: A | Discharge status: Home / Self Care | Payer: Medicare PPO | Source: Ambulatory Visit | Attending: Radiation Oncology | Admitting: Radiation Oncology

## 2021-06-24 ENCOUNTER — Other Ambulatory Visit: Payer: Self-pay

## 2021-06-24 VITALS — BP 118/61 | HR 57 | Temp 96.8°F | Resp 18 | Ht 73.0 in | Wt 184.2 lb

## 2021-06-24 DIAGNOSIS — I700 Atherosclerosis of aorta: Secondary | ICD-10-CM | POA: Diagnosis not present

## 2021-06-24 DIAGNOSIS — I4891 Unspecified atrial fibrillation: Secondary | ICD-10-CM | POA: Insufficient documentation

## 2021-06-24 DIAGNOSIS — K8020 Calculus of gallbladder without cholecystitis without obstruction: Secondary | ICD-10-CM | POA: Insufficient documentation

## 2021-06-24 DIAGNOSIS — J432 Centrilobular emphysema: Secondary | ICD-10-CM | POA: Diagnosis not present

## 2021-06-24 DIAGNOSIS — Z7901 Long term (current) use of anticoagulants: Secondary | ICD-10-CM | POA: Insufficient documentation

## 2021-06-24 DIAGNOSIS — I10 Essential (primary) hypertension: Secondary | ICD-10-CM | POA: Insufficient documentation

## 2021-06-24 DIAGNOSIS — Z79899 Other long term (current) drug therapy: Secondary | ICD-10-CM | POA: Insufficient documentation

## 2021-06-24 DIAGNOSIS — Z923 Personal history of irradiation: Secondary | ICD-10-CM | POA: Diagnosis not present

## 2021-06-24 DIAGNOSIS — R918 Other nonspecific abnormal finding of lung field: Secondary | ICD-10-CM | POA: Insufficient documentation

## 2021-06-24 DIAGNOSIS — Z85118 Personal history of other malignant neoplasm of bronchus and lung: Secondary | ICD-10-CM | POA: Diagnosis not present

## 2021-06-24 DIAGNOSIS — C3412 Malignant neoplasm of upper lobe, left bronchus or lung: Secondary | ICD-10-CM | POA: Insufficient documentation

## 2021-06-24 DIAGNOSIS — Z08 Encounter for follow-up examination after completed treatment for malignant neoplasm: Secondary | ICD-10-CM | POA: Diagnosis not present

## 2021-06-24 NOTE — Progress Notes (Signed)
Richard Vincent. is here today for follow up post radiation to the lung.  Lung Side: Left, patient completed treatment on 02/07/20  Does the patient complain of any of the following: Pain:Patient denies pain.  Shortness of breath w/wo exertion: Yes, on exertion Cough: Reports having a productive cough at times.  Hemoptysis: no Pain with swallowing: no Swallowing/choking concerns: no Appetite: Fair Energy Level: Reports having a good energy level.  Post radiation skin Changes: skin remains intact. Weight-  Wt Readings from Last 3 Encounters:  06/24/21 184 lb 4 oz (83.6 kg)  02/24/21 187 lb 6.4 oz (85 kg)  12/21/20 188 lb 8 oz (85.5 kg)     Additional comments if applicable:  Vitals:   89/38/10 1051  BP: 118/61  Pulse: (!) 57  Resp: 18  Temp: (!) 96.8 F (36 C)  TempSrc: Temporal  SpO2: 99%  Weight: 184 lb 4 oz (83.6 kg)  Height: 6\' 1"  (1.854 m)

## 2021-07-02 DIAGNOSIS — N401 Enlarged prostate with lower urinary tract symptoms: Secondary | ICD-10-CM | POA: Diagnosis not present

## 2021-07-02 DIAGNOSIS — R3129 Other microscopic hematuria: Secondary | ICD-10-CM | POA: Diagnosis not present

## 2021-07-07 ENCOUNTER — Other Ambulatory Visit: Payer: Self-pay | Admitting: Cardiology

## 2021-07-28 DIAGNOSIS — C44319 Basal cell carcinoma of skin of other parts of face: Secondary | ICD-10-CM | POA: Diagnosis not present

## 2021-07-28 DIAGNOSIS — L570 Actinic keratosis: Secondary | ICD-10-CM | POA: Diagnosis not present

## 2021-08-06 ENCOUNTER — Ambulatory Visit: Payer: Medicare PPO | Admitting: Pulmonary Disease

## 2021-08-06 ENCOUNTER — Encounter: Payer: Self-pay | Admitting: Pulmonary Disease

## 2021-08-06 ENCOUNTER — Other Ambulatory Visit: Payer: Self-pay

## 2021-08-06 VITALS — BP 114/52 | HR 62 | Temp 97.6°F | Ht 72.0 in | Wt 185.2 lb

## 2021-08-06 DIAGNOSIS — C3492 Malignant neoplasm of unspecified part of left bronchus or lung: Secondary | ICD-10-CM

## 2021-08-06 DIAGNOSIS — J439 Emphysema, unspecified: Secondary | ICD-10-CM | POA: Diagnosis not present

## 2021-08-06 DIAGNOSIS — Z923 Personal history of irradiation: Secondary | ICD-10-CM | POA: Diagnosis not present

## 2021-08-06 DIAGNOSIS — Z87891 Personal history of nicotine dependence: Secondary | ICD-10-CM | POA: Diagnosis not present

## 2021-08-06 DIAGNOSIS — C3412 Malignant neoplasm of upper lobe, left bronchus or lung: Secondary | ICD-10-CM | POA: Diagnosis not present

## 2021-08-06 DIAGNOSIS — J432 Centrilobular emphysema: Secondary | ICD-10-CM

## 2021-08-06 MED ORDER — ANORO ELLIPTA 62.5-25 MCG/ACT IN AEPB: 1.0000 | INHALATION_SPRAY | Freq: Every day | RESPIRATORY_TRACT | 0 refills | Status: DC

## 2021-08-06 NOTE — Patient Instructions (Addendum)
Thank you for visiting Dr. Valeta Harms at [facility]. Today we recommend the following:  Sample inhaler today   Return in about 1 year (around 08/06/2022), or if symptoms worsen or fail to improve.    Please do your part to reduce the spread of COVID-19.

## 2021-08-06 NOTE — Progress Notes (Signed)
Synopsis: Referred in April 2021 for lung mass by [name], MD  Subjective:   PATIENT ID: Richard Vincent. GENDER: male DOB: 02-10-1932, MRN: [id number]  Chief Complaint  Patient presents with   Follow-up    Patient is here for a follow up. Patient has no complaints.     This is an 88 year old gentleman past medical history of asthma, hypertension, A. fib on anticoagulation, gastroesophageal reflux disease.  Patient was referred to our clinic for abnormality and CT imaging, CT of the chest on 10/23/2019 which revealed a new spiculated 3 cm left upper lobe lung mass concerning for primary bronchogenic carcinoma.  Patient is a former smoker quit 1996. He has no complaints today. Overall has a good functional status. Able to complete all of his activities of daily living. He lives with his wife. Able to push mow the yard. He is on Eliquis for stroke prevention and atrial fibrillation.  OV 11/27/2019: Here today for follow-up after recent bronchoscopy.  Doing well at this time.  Reviewed recent pathology results.  Patient has been seen by radiation oncology already.  Patient is anxious today about all of this new recent news.  At this point no additional respiratory symptoms.  Considering evaluation for surgery.  Referral has already been placed to see Dr. Roxan Hockey.  OV 08/17/2020: Here for follow-up after recent diagnosis of pneumonia.  Had follow-up in clinic with Wyn Quaker, NP.  Also was seen in the ER at [facility].  He is doing well at this time.  Was diagnosed with purulent pleurisy.  He completed 5 cycles of SBRT for his upper lobe lung cancer.  He has no hemoptysis.  He has 97-month imaging follow-up and seeing radiation oncology in June 2022.  From respiratory standpoint and denies shortness of breath cough sputum production hemoptysis.  Currently uses no inhalers.  Prior pulmonary function test completed during this work-up revealed mild obstructive disease.  OV 08/06/2021: Here  today for office follow-up.  Was diagnosed over a year ago with a stage I lung cancer underwent SBRT treatments doing well.  Prior pulmonary function test with reduced ratio and mild obstructive lung disease.  Not on any inhalers at this time.  He does get winded with minimal exertion but otherwise able to complete all of his activities of daily living.  No recent exacerbations in the past year.  Last seen in the office by me in January of this past year.  Had a recent follow-up CT scanThat was completed by radiation oncology for follow-up of his lung cancer on 06/24/2021 which showed no evidence or recurrence of disease.  Only a area is the postradiation scarring within the lingula.   Past Medical History:  Diagnosis Date   Asthma    childhood    Basal cell cancer    nose   BPH (benign prostatic hyperplasia)    GERD (gastroesophageal reflux disease)    occ   Gout    changed diet years ago ; no gout attacks since    History of radiation therapy 01/29/2020-02/07/2020   SBRT to left lung       Dr Gery Pray   Hypertension    Lung cancer [facility]) 2021   Paroxysmal A-fib ([place])    occurred post-operatively ; last EKG he was in NSR    Renal insufficiency    non problematic      Family History  Problem Relation Age of Onset   Coronary artery disease Other  No family history     Past Surgical History:  Procedure Laterality Date   BRONCHIAL BIOPSY  11/12/2019   Procedure: BRONCHIAL BIOPSIES;  Surgeon: [name], DO;  Location: [place];  Service: Pulmonary;;   BRONCHIAL BRUSHINGS  11/12/2019   Procedure: BRONCHIAL BRUSHINGS;  Surgeon: [name], DO;  Location: [facility];  Service: Pulmonary;;   BRONCHIAL WASHINGS  11/12/2019   Procedure: BRONCHIAL WASHINGS;  Surgeon: [name], DO;  Location: [place] [facility];  Service: Pulmonary;;   ENDOBRONCHIAL ULTRASOUND  11/12/2019   Procedure: ENDOBRONCHIAL ULTRASOUND;  Surgeon: [name], DO;  Location: [place] [facility];   Service: Pulmonary;;   EYE SURGERY  about 5-6 years ago    cataracts bilateral    FIDUCIAL MARKER PLACEMENT  11/12/2019   Procedure: FIDUCIAL MARKER PLACEMENT;  Surgeon: [name], DO;  Location: [place] [facility];  Service: Pulmonary;;   FINE NEEDLE ASPIRATION  11/12/2019   Procedure: FINE NEEDLE ASPIRATION (FNA) LINEAR;  Surgeon: [name], DO;  Location: [facility];  Service: Pulmonary;;   NOSE SURGERY     Skin cancer removed     TONSILLECTOMY     TRANSURETHRAL RESECTION OF PROSTATE     VIDEO BRONCHOSCOPY WITH ENDOBRONCHIAL NAVIGATION N/A 11/12/2019   Procedure: VIDEO BRONCHOSCOPY WITH ENDOBRONCHIAL NAVIGATION;  Surgeon: [name], DO;  Location: [facility];  Service: Pulmonary;  Laterality: N/A;   XI ROBOTIC ASSISTED SIMPLE PROSTATECTOMY N/A 12/14/2017   Procedure: XI ROBOTIC ASSISTED SIMPLE PROSTATECTOMY;  Surgeon: Cleon Gustin, MD;  Location: [facility];  Service: Urology;  Laterality: N/A;    Social History   Socioeconomic History   Marital status: Married    Spouse name: Not on file   Number of children: 6   Years of education: Not on file   Highest education level: Not on file  Occupational History    Comment: Engineer  Tobacco Use   Smoking status: Former    Packs/day: 1.00    Years: 30.00    Pack years: 30.00    Types: Cigarettes    Quit date: 10/17/1990    Years since quitting: 30.8   Smokeless tobacco: Never  Vaping Use   Vaping Use: Never used  Substance and Sexual Activity   Alcohol use: Yes    Alcohol/week: 1.0 - 2.0 standard drink    Types: 1 - 2 Glasses of wine per week    Comment: every other day   Drug use: No   Sexual activity: Not on file  Other Topics Concern   Not on file  Social History Narrative   Not on file   Social Determinants of Health   Financial Resource Strain: Not on file  Food Insecurity: Not on file  Transportation Needs: Not on file  Physical Activity: Not on file  Stress: Not on file  Social Connections:  Not on file  Intimate Partner Violence: Not on file     Allergies  Allergen Reactions   Latex Rash    Mild rash     Outpatient Medications Prior to Visit  Medication Sig Dispense Refill   amLODipine (NORVASC) 10 MG tablet TAKE ONE TABLET BY MOUTH DAILY 90 tablet 3   ascorbic acid (VITAMIN C) 500 MG tablet Take 500 mg by mouth every evening.     cholecalciferol (VITAMIN D3) 25 MCG (1000 UNIT) tablet Take 1,000 Units by mouth in the morning and at bedtime.     ELIQUIS 2.5 MG TABS tablet TAKE ONE TABLET BY MOUTH TWICE  A DAY 180 tablet 1   Investigational - Study Medication Take 1 tablet by mouth daily. Preventable study drug: Atorvastatin 40 mg or placebo     metoprolol succinate (TOPROL-XL) 50 MG 24 hr tablet TAKE ONE TABLET BY MOUTH DAILY 90 tablet 1   OVER THE COUNTER MEDICATION Take 8 g by mouth daily. Psyllium husk fiber     No facility-administered medications prior to visit.    Review of Systems  Constitutional:  Negative for chills, fever, malaise/fatigue and weight loss.  HENT:  Negative for hearing loss, sore throat and tinnitus.   Eyes:  Negative for blurred vision and double vision.  Respiratory:  Positive for shortness of breath. Negative for cough, hemoptysis, sputum production, wheezing and stridor.   Cardiovascular:  Negative for chest pain, palpitations, orthopnea, leg swelling and PND.  Gastrointestinal:  Negative for abdominal pain, constipation, diarrhea, heartburn, nausea and vomiting.  Genitourinary:  Negative for dysuria, hematuria and urgency.  Musculoskeletal:  Negative for joint pain and myalgias.  Skin:  Negative for itching and rash.  Neurological:  Negative for dizziness, tingling, weakness and headaches.  Endo/Heme/Allergies:  Negative for environmental allergies. Does not bruise/bleed easily.  Psychiatric/Behavioral:  Negative for depression. The patient is not nervous/anxious and does not have insomnia.   All other systems reviewed and are  negative.   Objective:  Physical Exam Vitals reviewed.  Constitutional:      General: He is not in acute distress.    Appearance: He is well-developed.  HENT:     Head: Normocephalic and atraumatic.  Eyes:     General: No scleral icterus.    Conjunctiva/sclera: Conjunctivae normal.     Pupils: Pupils are equal, round, and reactive to light.  Neck:     Vascular: No JVD.     Trachea: No tracheal deviation.  Cardiovascular:     Rate and Rhythm: Normal rate and regular rhythm.     Heart sounds: Normal heart sounds. No murmur heard. Pulmonary:     Effort: Pulmonary effort is normal. No tachypnea, accessory muscle usage or respiratory distress.     Breath sounds: No stridor. No wheezing, rhonchi or rales.  Abdominal:     General: There is no distension.     Palpations: Abdomen is soft.     Tenderness: There is no abdominal tenderness.  Musculoskeletal:        General: No tenderness.     Cervical back: Neck supple.  Lymphadenopathy:     Cervical: No cervical adenopathy.  Skin:    General: Skin is warm and dry.     Capillary Refill: Capillary refill takes less than 2 seconds.     Findings: No rash.  Neurological:     Mental Status: He is alert and oriented to person, place, and time.  Psychiatric:        Behavior: Behavior normal.     Vitals:   08/06/21 1443  BP: (!) 114/52  Pulse: 62  Temp: 97.6 F (36.4 C)  TempSrc: Oral  SpO2: 99%  Weight: 185 lb 3.2 oz (84 kg)  Height: 6' (1.829 m)   99% on RA BMI Readings from Last 3 Encounters:  08/06/21 25.12 kg/m  06/24/21 24.31 kg/m  02/24/21 24.72 kg/m   Wt Readings from Last 3 Encounters:  08/06/21 185 lb 3.2 oz (84 kg)  06/24/21 184 lb 4 oz (83.6 kg)  02/24/21 187 lb 6.4 oz (85 kg)     CBC    Component Value Date/Time   WBC 7.4  06/17/2020 1333   RBC 4.64 06/17/2020 1333   HGB 15.0 06/17/2020 1333   HGB 14.4 10/16/2019 1003   HCT 45.2 06/17/2020 1333   HCT 44.3 10/16/2019 1003   PLT 260 06/17/2020  1333   PLT 227 10/16/2019 1003   MCV 97.4 06/17/2020 1333   MCV 96 10/16/2019 1003   MCH 32.3 06/17/2020 1333   MCHC 33.2 06/17/2020 1333   RDW 13.4 06/17/2020 1333   RDW 12.0 10/16/2019 1003   LYMPHSABS 1.9 06/17/2020 1333   MONOABS 0.9 06/17/2020 1333   EOSABS 0.1 06/17/2020 1333   BASOSABS 0.1 06/17/2020 1333     Chest Imaging: CT chest 10/23/2019: Spiculated 3 cm left upper lobe lung mass concerning for primary bronchogenic carcinoma. The patient's images have been independently reviewed by me.    CT chest June 10, 2020: Radiation changes to the left upper lobe.  No evidence of metastatic disease.  Small left-sided dependent effusion evidence of emphysema. The patient's images have been independently reviewed by me.    CT chest 06/24/2021: Lingular scarring status post radiation no evidence recurrence of disease. The patient's images have been independently reviewed by me.    Pulmonary Functions Testing Results: PFT Results Latest Ref Rng & Units 12/05/2019  FVC-Pre L 4.19  FVC-Predicted Pre % 96  FVC-Post L 4.33  FVC-Predicted Post % 100  Pre FEV1/FVC % % 59  Post FEV1/FCV % % 66  FEV1-Pre L 2.47  FEV1-Predicted Pre % 80  FEV1-Post L 2.84  DLCO uncorrected ml/min/mmHg 15.80  DLCO UNC% % 60  DLVA Predicted % 64  TLC L 7.66  TLC % Predicted % 99  RV % Predicted % 118    FeNO: none   Pathology:  pathology: Consistent with squamous cell carcinoma of the lung.  Echocardiogram:   Study Conclusions   - Left ventricle: The cavity size was normal. Wall thickness was    increased in a pattern of moderate LVH. Systolic function was    normal. The estimated ejection fraction was in the range of 55%    to 60%. Wall motion was normal; there were no regional wall    motion abnormalities. Features are consistent with a pseudonormal    left ventricular filling pattern, with concomitant abnormal    relaxation and increased filling pressure (grade 2 diastolic     dysfunction).  - Aortic valve: Transvalvular velocity was minimally increased.    There was no stenosis. Peak velocity (S): 214 cm/s.  - Mitral valve: There was mild regurgitation.  - Left atrium: The atrium was mildly dilated.  - Tricuspid valve: There was mild regurgitation.  - Pulmonary arteries: Systolic pressure was mildly increased. PA    peak pressure: 34 mm Hg (S).   Heart Catheterization: none     Assessment & Plan:      ICD-10-CM   1. Primary cancer of left upper lobe of lung ([place])  C34.12     2. Pulmonary emphysema, unspecified emphysema type ([place])  J43.9     3. Squamous cell carcinoma of left lung ([facility])  C34.92     4. Former smoker  Z87.891     5. Centrilobular emphysema ([place])  J43.2     6. History of radiation exposure  Z92.3        Discussion:  This is an 88 year old gentleman that was found to have a incidental 3.4 cm left upper lobe mass diagnosed with squamous cell carcinoma of the lung status post bronchoscopy referred to radiation oncology completed  5 cycles of SBRT.  Patient does have stage I COPD with centrilobular emphysema currently not on any maintenance inhalers does have some shortness of breath with exertion.  Plan: Previously not super interested in being on inhaler regimen. Okay at this time willing to try a sample. We will give him samples of Anoro Ellipta to see if this helps some of his exertional dyspnea. He is on a let us know if he likes being on the medication happy to send in a new prescription for him if needed. Otherwise patient can follow-up with [place] in 1 year or as needed. CT imaging surveillance of lung cancer already ordered by the cancer center.    Current Outpatient Medications:    amLODipine (NORVASC) 10 MG tablet, TAKE ONE TABLET BY MOUTH DAILY, Disp: 90 tablet, Rfl: 3   ascorbic acid (VITAMIN C) 500 MG tablet, Take 500 mg by mouth every evening., Disp: , Rfl:    cholecalciferol (VITAMIN D3) 25 MCG (1000 UNIT) tablet, Take  1,000 Units by mouth in the morning and at bedtime., Disp: , Rfl:    ELIQUIS 2.5 MG TABS tablet, TAKE ONE TABLET BY MOUTH TWICE A DAY, Disp: 180 tablet, Rfl: 1   Investigational - Study Medication, Take 1 tablet by mouth daily. Preventable study drug: Atorvastatin 40 mg or placebo, Disp: , Rfl:    metoprolol succinate (TOPROL-XL) 50 MG 24 hr tablet, TAKE ONE TABLET BY MOUTH DAILY, Disp: 90 tablet, Rfl: 1   OVER THE COUNTER MEDICATION, Take 8 g by mouth daily. Psyllium husk fiber, Disp: , Rfl:    [name], DO [facility] 08/06/2021 3:07 PM

## 2021-08-06 NOTE — Addendum Note (Signed)
Addended by: Fran Lowes on: 08/06/2021 04:15 PM   Modules accepted: Orders

## 2021-09-16 IMAGING — CT CT CHEST W/O CM
2 of 3 series · 15 of 36 positions shown, 18 images · non-contrast
Comparison: 09/14/2019 chest radiograph.

CLINICAL DATA: Questionable left mid lung nodule on recent chest
radiograph. Nonsmoker.

EXAM:
CT CHEST WITHOUT CONTRAST
TECHNIQUE: Multidetector CT imaging of the chest was performed following the
standard protocol without IV contrast.

[Series 2: thorax · axial · 0.82mm/px · z∈[-184,+76]mm · 12 of 154 slices shown, 15 images]
[im 12/154  mediastinal]
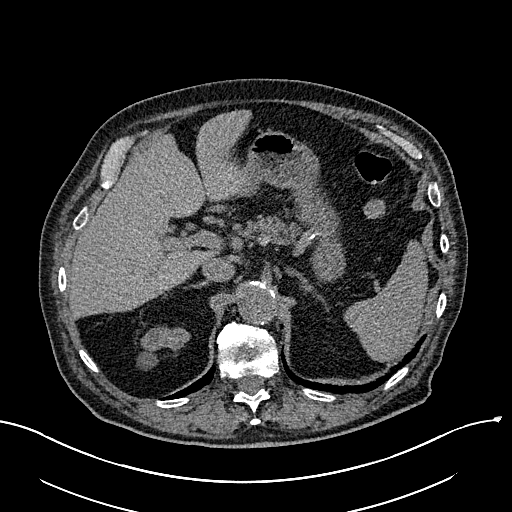
[im 12/154  lung]
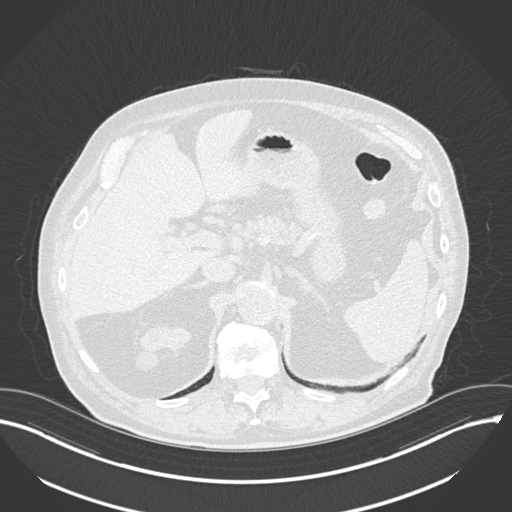
[im 23/154  lung]
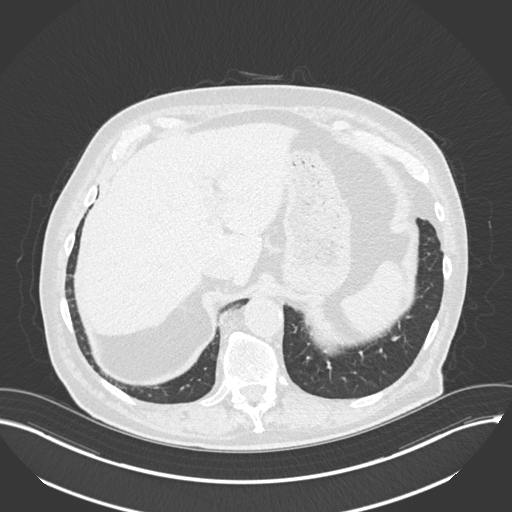
[im 35/154  lung]
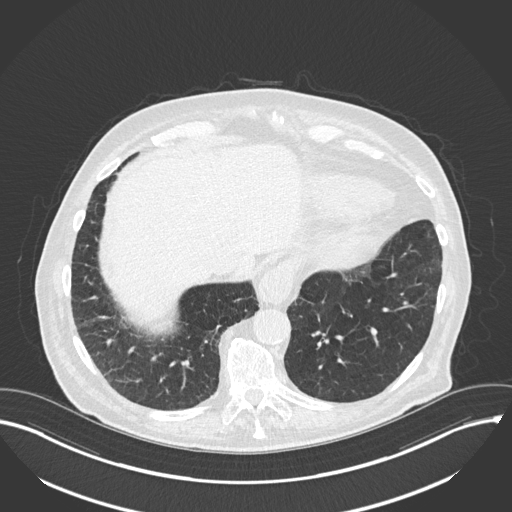
[im 46/154  lung]
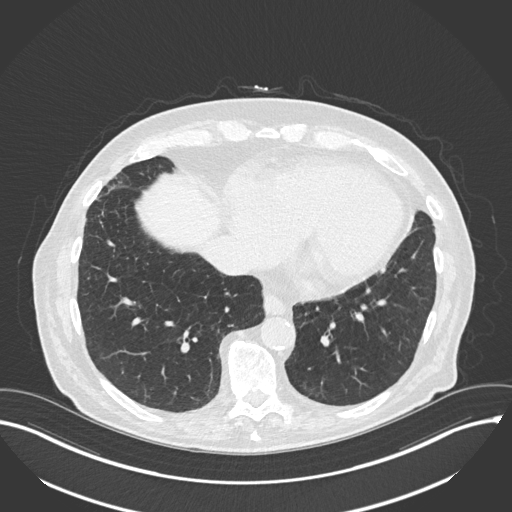
[im 57/154  mediastinal]
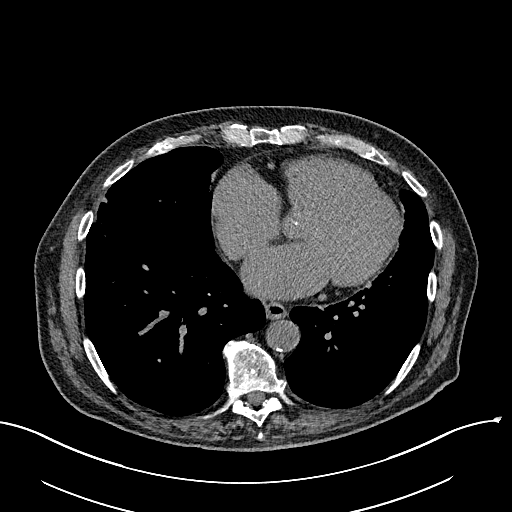
[im 57/154  lung]
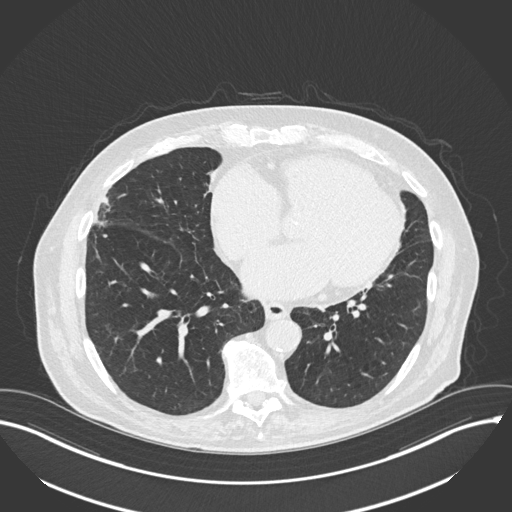
[im 69/154  lung]
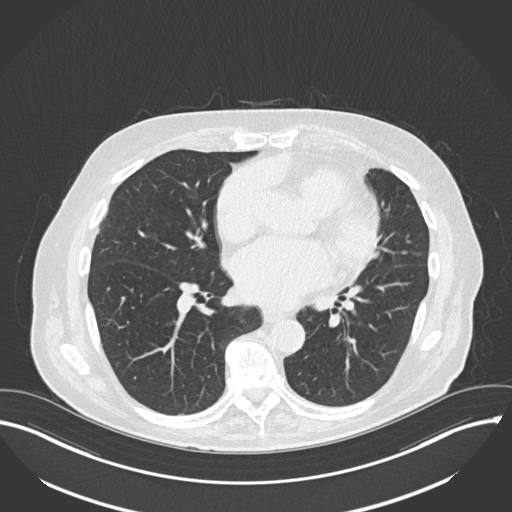
[im 86/154  lung]
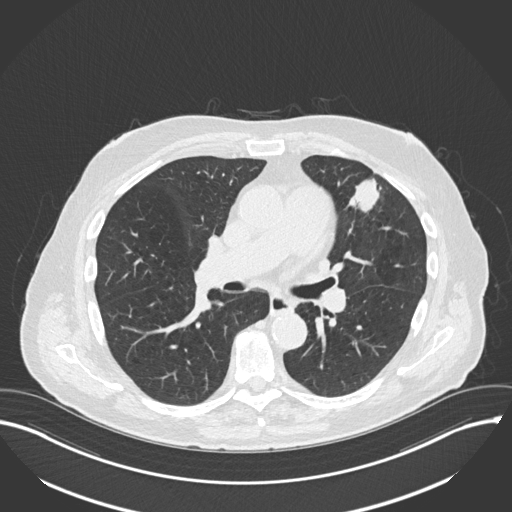
[im 97/154  lung]
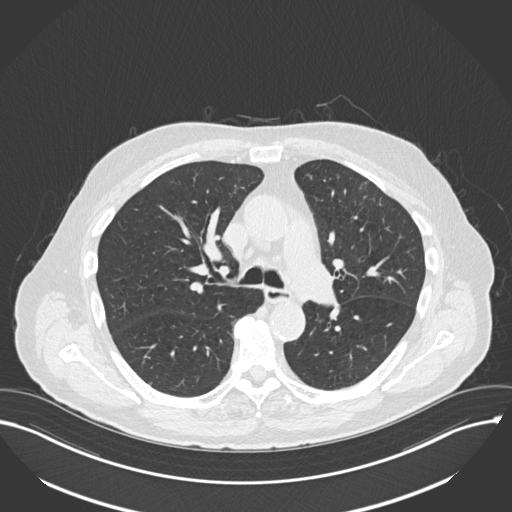
[im 108/154  mediastinal]
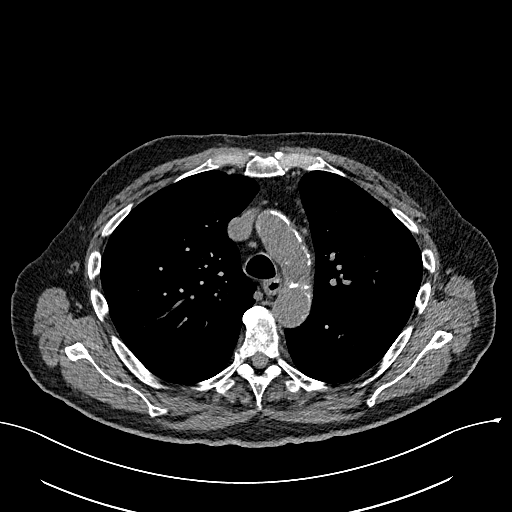
[im 108/154  lung]
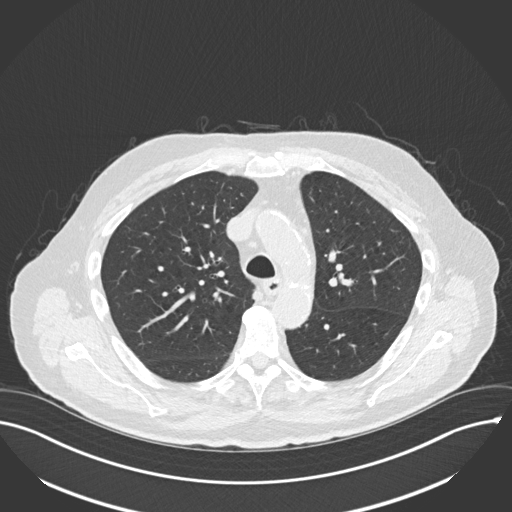
[im 120/154  lung]
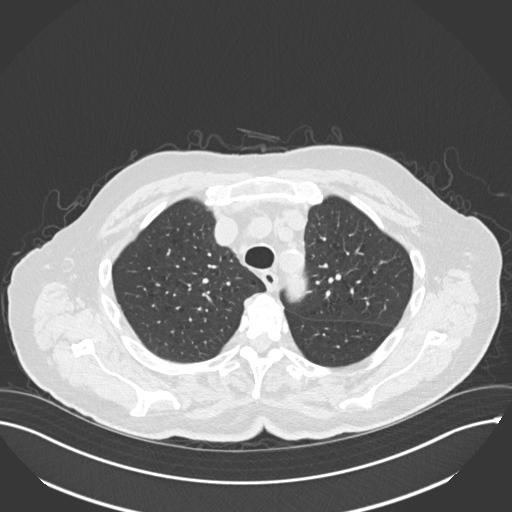
[im 131/154  lung]
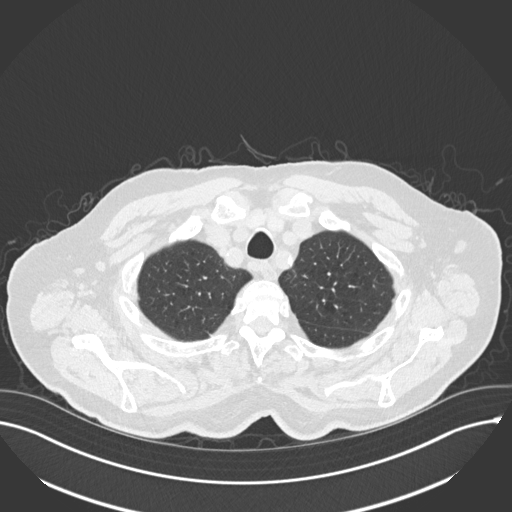
[im 142/154  lung]
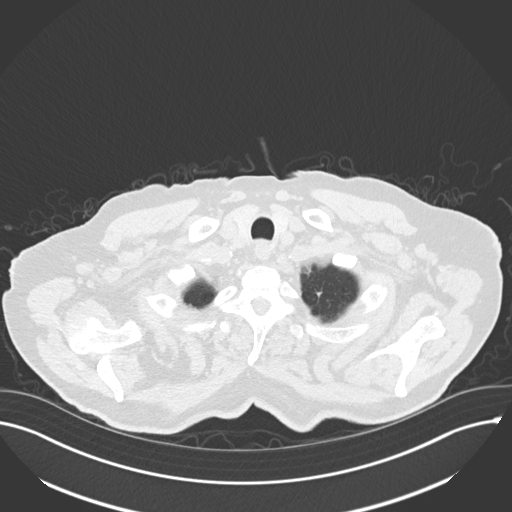

[Series 5: coronal · coronal · 0.60mm/px · 3 of 151 slices shown]
[im 31/151  lung]
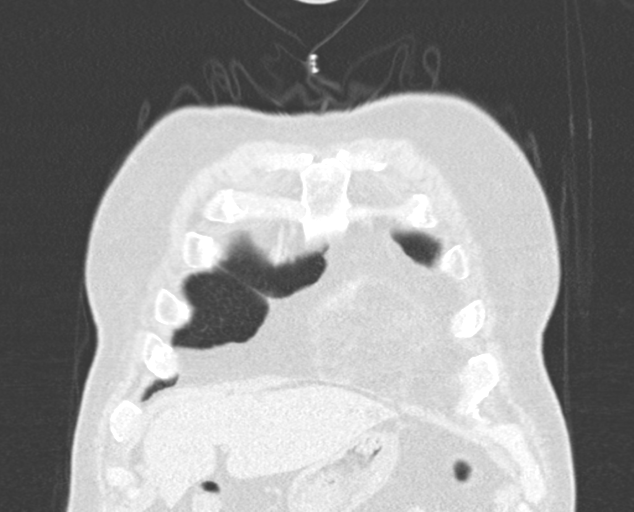
[im 61/151  lung]
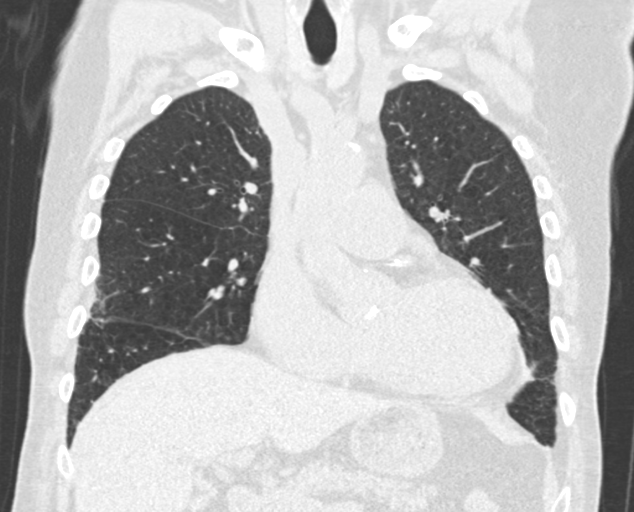
[im 91/151  lung]
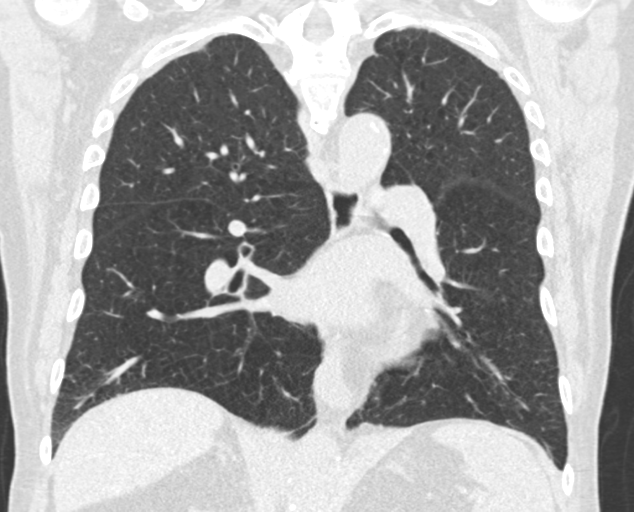

[15 of 36 positions shown; findings below may reference images not displayed]

FINDINGS: Cardiovascular: Normal heart size. No significant pericardial
effusion/thickening. Three-vessel coronary atherosclerosis.
Atherosclerotic nonaneurysmal thoracic aorta. Dilated main pulmonary
artery (3.6 cm diameter).

Mediastinum/Nodes: No discrete thyroid nodules. Unremarkable
esophagus. No pathologically enlarged axillary, mediastinal or hilar
lymph nodes, noting limited sensitivity for the detection of hilar
adenopathy on this noncontrast study.

Lungs/Pleura: No pneumothorax. No pleural effusion. Mild
centrilobular emphysema. Spiculated solid 3.0 x 2.0 cm anterior left
upper lobe lung mass (series 3/image 60). Two tiny peripheral right
lower lobe solid pulmonary nodules, largest 3 mm (series 3/image
96). Nonspecific mild patchy subpleural reticulation in the lower
lungs.

Upper abdomen: Small hiatal hernia. Scattered subcentimeter
hypodense left liver lesions are too small to characterize.
Cholelithiasis. Severe atrophy of the visualized right kidney.
Exophytic simple 2.6 cm posterior upper right renal cyst.

Musculoskeletal: No aggressive appearing focal osseous lesions.
Moderate thoracic spondylosis.
IMPRESSION: 1. Spiculated solid 3.0 cm anterior left upper lobe lung mass,
suspicious for primary bronchogenic carcinoma. Multidisciplinary
thoracic oncology consultation and PET-CT recommended for further
evaluation.
2. No thoracic adenopathy.
3. Two tiny solid peripheral right lower lobe pulmonary nodules,
largest 3 mm, for which follow-up on chest CT is recommended in 3
months.
4. Dilated main pulmonary artery, suggesting pulmonary arterial
hypertension.
5. Three-vessel coronary atherosclerosis.
6. Small hiatal hernia.
7. Cholelithiasis.
8. Aortic Atherosclerosis (U5ME3-3S6.6) and Emphysema (U5ME3-SRG.K).

These results will be called to the ordering clinician or
representative by the Radiologist Assistant, and communication
documented in the PACS or [REDACTED].

## 2021-10-10 ENCOUNTER — Emergency Department (HOSPITAL_BASED_OUTPATIENT_CLINIC_OR_DEPARTMENT_OTHER)
Admission: EM | Admit: 2021-10-10 | Discharge: 2021-10-10 | Disposition: A | Discharge status: Home / Self Care | Payer: Medicare PPO | Attending: Emergency Medicine | Admitting: Emergency Medicine

## 2021-10-10 ENCOUNTER — Encounter (HOSPITAL_BASED_OUTPATIENT_CLINIC_OR_DEPARTMENT_OTHER): Payer: Self-pay

## 2021-10-10 DIAGNOSIS — I10 Essential (primary) hypertension: Secondary | ICD-10-CM | POA: Diagnosis not present

## 2021-10-10 DIAGNOSIS — U071 COVID-19: Secondary | ICD-10-CM | POA: Diagnosis not present

## 2021-10-10 DIAGNOSIS — Z7901 Long term (current) use of anticoagulants: Secondary | ICD-10-CM | POA: Insufficient documentation

## 2021-10-10 DIAGNOSIS — J029 Acute pharyngitis, unspecified: Secondary | ICD-10-CM | POA: Diagnosis not present

## 2021-10-10 DIAGNOSIS — Z79899 Other long term (current) drug therapy: Secondary | ICD-10-CM | POA: Insufficient documentation

## 2021-10-10 LAB — RESP PANEL BY RT-PCR (FLU A&B, COVID) ARPGX2
Influenza A by PCR: NEGATIVE
Influenza B by PCR: NEGATIVE
SARS Coronavirus 2 by RT PCR: POSITIVE — AB

## 2021-10-10 LAB — GROUP A STREP BY PCR: Group A Strep by PCR: NOT DETECTED

## 2021-10-10 NOTE — ED Provider Notes (Signed)
?[facility]ENC[facility] DEPARTMENT ?Provider Note ? ? ?CSN: [id number] ?Arrival date & time: 10/10/21  1213 ? ?  ? ?History ? ?Chief Complaint  ?Patient presents with  ? Sore Throat  ? ? ?Richard Vincent. is a 88 y.o. male. ? ? ?Sore Throat ? ? ?Patient presented to the ED for evaluation of a sore throat.  Patient has history of hypertension gout BPH renal insufficiency paroxysmal A-fib and gastroesophageal reflux.  Patient states he started having sore throat in the last couple days.  Symptoms at times can be more intense and has had to take Cepacol for relief.  Patient states after taking that medication he is able to eat drink and swallow.  Right now the pain is only a 1 out of 10.  He has not had any fevers or chills.  No difficulty handling his saliva.  He denies any shortness of breath cough. ? ?Home Medications ?Prior to Admission medications   ?Medication Sig Start Date End Date Taking? Authorizing Provider  ?amLODipine (NORVASC) 10 MG tablet TAKE ONE TABLET BY MOUTH DAILY 07/07/21   Lelon Perla, MD  ?ascorbic acid (VITAMIN C) 500 MG tablet Take 500 mg by mouth every evening.    [provider]  ?cholecalciferol (VITAMIN D3) 25 MCG (1000 UNIT) tablet Take 1,000 Units by mouth in the morning and at bedtime.    [provider]  ?ELIQUIS 2.5 MG TABS tablet TAKE ONE TABLET BY MOUTH TWICE A DAY 04/12/21   Lelon Perla, MD  ?Investigational - Study Medication Take 1 tablet by mouth daily. Preventable study drug: Atorvastatin 40 mg or placebo    [provider]  ?metoprolol succinate (TOPROL-XL) 50 MG 24 hr tablet TAKE ONE TABLET BY MOUTH DAILY 04/19/21   Lelon Perla, MD  ?OVER THE COUNTER MEDICATION Take 8 g by mouth daily. Psyllium husk fiber    [provider]  ?umeclidinium-vilanterol (ANORO ELLIPTA) 62.5-25 MCG/ACT AEPB Inhale 1 puff into the lungs daily. 08/06/21   [name], DO  ?   ? ?Allergies    ?Latex   ? ?Review of Systems   ?Review of  Systems  ?Constitutional:  Negative for fever.  ? ?Physical Exam ?Updated Vital Signs ?BP (!) 143/78 (BP Location: Left Arm)   Pulse 70   Temp (!) 97.5 ?F (36.4 ?C) (Oral)   Resp 18   Ht 1.829 m (6')   Wt 84.8 kg   SpO2 100%   BMI 25.36 kg/m?  ?Physical Exam ?Vitals and nursing note reviewed.  ?Constitutional:   ?   General: He is not in acute distress. ?   Appearance: He is well-developed.  ?HENT:  ?   Head: Normocephalic and atraumatic.  ?   Right Ear: External ear normal.  ?   Left Ear: External ear normal.  ?   Mouth/Throat:  ?   Mouth: Mucous membranes are moist. No oral lesions.  ?   Pharynx: No oropharyngeal exudate, posterior oropharyngeal erythema or uvula swelling.  ?   Tonsils: No tonsillar exudate or tonsillar abscesses.  ?   Comments: Patient able to speak without any difficulty, normal voice ?Eyes:  ?   General: No scleral icterus.    ?   Right eye: No discharge.     ?   Left eye: No discharge.  ?   Conjunctiva/sclera: Conjunctivae normal.  ?Neck:  ?   Trachea: No tracheal deviation.  ?Cardiovascular:  ?   Rate and Rhythm: Normal rate  and regular rhythm.  ?Pulmonary:  ?   Effort: Pulmonary effort is normal. No respiratory distress.  ?   Breath sounds: Normal breath sounds. No stridor. No wheezing or rales.  ?Abdominal:  ?   General: Bowel sounds are normal. There is no distension.  ?   Palpations: Abdomen is soft.  ?   Tenderness: There is no abdominal tenderness. There is no guarding or rebound.  ?Musculoskeletal:     ?   General: No tenderness or deformity.  ?   Cervical back: Neck supple.  ?Skin: ?   General: Skin is warm and dry.  ?   Findings: No rash.  ?Neurological:  ?   General: No focal deficit present.  ?   Mental Status: He is alert.  ?   Cranial Nerves: No cranial nerve deficit (no facial droop, extraocular movements intact, no slurred speech).  ?   Sensory: No sensory deficit.  ?   Motor: No abnormal muscle tone or seizure activity.  ?   Coordination: Coordination normal.   ?Psychiatric:     ?   Mood and Affect: Mood normal.  ? ? ?ED Results / Procedures / Treatments   ?Labs ?(all labs ordered are listed, but only abnormal results are displayed) ?Labs Reviewed  ?GROUP A STREP BY PCR  ?RESP PANEL BY RT-PCR (FLU A&B, COVID) [id number]  ? ? ?EKG ?None ? ?Radiology ?No results found. ? ?Procedures ?Procedures  ? ? ?Medications Ordered in ED ?Medications - No data to display ? ?ED Course/ Medical Decision Making/ A&P ?  ?                        ?Medical Decision Making ? ?Patient presented to ER for evaluation of sore throat.  Exam is reassuring.  He is not having any difficulty with his secretions.  No difficulty speaking.  I doubt peritonsillar abscess retropharyngeal abscess.  Does not have any significant exudate or lesions on exam.  Strep test is negative.  We will send off a COVID flu test.  Patient did not want to wait for the results.  Recommend following up on MyChart.   ? ?Evaluation and diagnostic testing in the emergency department does not suggest an emergent condition requiring admission or immediate intervention beyond what has been performed at this time.  The patient is safe for discharge and has been instructed to return immediately for worsening symptoms, change in symptoms or any other concerns. ? ? ? ? ? ? ? ? ?Final Clinical Impression(s) / ED Diagnoses ?Final diagnoses:  ?Pharyngitis, unspecified etiology  ? ? ?Rx / DC Orders ?ED Discharge Orders   ? ? None  ? ?  ? ? ?  ?Dorie Rank, MD ?10/10/21 1409 ? ?

## 2021-10-10 NOTE — Discharge Instructions (Addendum)
Take over-the-counter Tylenol as well as the Cepacol spray to help with your sore throat.  The covid flu tested will result later this evening.  Check your MyChart to see the results.   ?

## 2021-10-10 NOTE — ED Triage Notes (Signed)
C/o sore throat for the past few days. Been taking cepacol without relief. Denies other symptoms ?

## 2021-10-16 ENCOUNTER — Other Ambulatory Visit: Payer: Self-pay | Admitting: Cardiology

## 2021-10-16 DIAGNOSIS — I480 Paroxysmal atrial fibrillation: Secondary | ICD-10-CM

## 2021-10-17 ENCOUNTER — Other Ambulatory Visit: Payer: Self-pay | Admitting: Cardiology

## 2021-10-18 DIAGNOSIS — R3915 Urgency of urination: Secondary | ICD-10-CM | POA: Diagnosis not present

## 2021-10-18 DIAGNOSIS — R3912 Poor urinary stream: Secondary | ICD-10-CM | POA: Diagnosis not present

## 2021-10-18 DIAGNOSIS — N3020 Other chronic cystitis without hematuria: Secondary | ICD-10-CM | POA: Diagnosis not present

## 2021-10-18 DIAGNOSIS — N401 Enlarged prostate with lower urinary tract symptoms: Secondary | ICD-10-CM | POA: Diagnosis not present

## 2021-10-18 NOTE — Telephone Encounter (Signed)
Prescription refill request for Eliquis received. ?Indication:Afib ?Last office visit:8/22 ?Scr:1.7 ?Age: 88 ?Weight:84.8 kg ? ?Prescription refilled ? ?

## 2021-12-09 ENCOUNTER — Telehealth: Payer: Self-pay | Admitting: *Deleted

## 2021-12-09 NOTE — Telephone Encounter (Signed)
CALLED PATIENT TO INFORM OF CT FOR 12-23-21 - ARRIVAL TIME- 11:30 AM @ [facility], NO RESTRICTIONS TO TEST, PATIENT TO RECEIVE RESULTS FROM DR. KINARD ON 12-27-21 @ 10 AM, LVM FOR A RETURN CALL

## 2021-12-20 ENCOUNTER — Telehealth: Payer: Self-pay | Admitting: *Deleted

## 2021-12-20 NOTE — Telephone Encounter (Signed)
CALLED PATIENT TO ASK ABOUT RESCHEDULING FU ON 12-27-21 DUE TO DR. KINARD BEING IN THE OR, RESCHEDULED FOR 12-30-21 @ 10:15 AM, SPOKE WITH PATIENT AND HE AGREED TO THIS NEW DAY AND TIME

## 2021-12-23 ENCOUNTER — Ambulatory Visit (HOSPITAL_COMMUNITY)
Admission: RE | Admit: 2021-12-23 | Discharge: 2021-12-23 | Disposition: A | Discharge status: Home / Self Care | Payer: Medicare PPO | Source: Ambulatory Visit | Attending: Radiation Oncology | Admitting: Radiation Oncology

## 2021-12-23 DIAGNOSIS — C3490 Malignant neoplasm of unspecified part of unspecified bronchus or lung: Secondary | ICD-10-CM | POA: Diagnosis not present

## 2021-12-23 DIAGNOSIS — C3412 Malignant neoplasm of upper lobe, left bronchus or lung: Secondary | ICD-10-CM | POA: Insufficient documentation

## 2021-12-23 DIAGNOSIS — J181 Lobar pneumonia, unspecified organism: Secondary | ICD-10-CM | POA: Diagnosis not present

## 2021-12-27 ENCOUNTER — Ambulatory Visit: Payer: Medicare PPO | Admitting: Radiation Oncology

## 2021-12-29 NOTE — Progress Notes (Signed)
Radiation Oncology         [phone]) [phone] ________________________________  Name: Richard Vincent. MRN: [id number]  Date: 12/30/2021  DOB: 04-Feb-1932  Follow-Up Visit Note  CC: [name], MD  [name], DO    ICD-10-CM   1. Primary cancer of left upper lobe of lung ([place])  C34.12 CT CHEST WO CONTRAST    2. Lung mass  R91.8       Diagnosis:   Stage IA3 (T1c,N0, M0) squamous cell carcinoma of the left upper lobe   Interval Since Last Radiation: 1 year, 10 months, and 22 days   Radiation Treatment Dates: 01/29/2020 through 02/07/2020 Site Technique Total Dose (Gy) Dose per Fx (Gy) Completed Fx Beam Energies  Lung, Left: Lung_Lt IMRT 60/60 12 5/5 6XFFF    Narrative:  The patient returns today for routine 6 month follow-up and to review recent imaging, he was last seen here for follow up on 06/24/21.  Since his last visit, the patient followed up with Dr. Valeta Harms on 08/06/21. During which time, the patient was noted to report SOB on minimal exertion but otherwise denied difficulties completing his ADL's. For his SOB, the patient agreed to try samples of Anoro Ellipta, and will follow up with Dr. Valeta Harms again in 1 year.   His most recent chest CT on 12/23/21 showed stable postsurgical changes in the lingula and no evidence of lung cancer recurrence or metastatic disease.   Of note: the patient presented to the ED on 10/10/21 for evaluation of a severe sore throat x several days. No abscesses or lesions were appreciated on examination, and strep test was negative.  He reports being diagnosed with COVID at that time.  He reports significant fatigue for 4 days but no respiratory symptoms.  Patient denies any changes in his breathing situation.  He does have some dyspnea with exertion.  He denies any pain within the chest area significant cough or hemoptysis.  Allergies:  is allergic to latex.  Meds: Current Outpatient Medications  Medication Sig Dispense Refill   amLODipine  (NORVASC) 10 MG tablet TAKE ONE TABLET BY MOUTH DAILY 90 tablet 3   ascorbic acid (VITAMIN C) 500 MG tablet Take 500 mg by mouth every evening.     cholecalciferol (VITAMIN D3) 25 MCG (1000 UNIT) tablet Take 1,000 Units by mouth in the morning and at bedtime.     ELIQUIS 2.5 MG TABS tablet TAKE ONE TABLET BY MOUTH TWICE A DAY 180 tablet 1   Investigational - Study Medication Take 1 tablet by mouth daily. Preventable study drug: Atorvastatin 40 mg or placebo     metoprolol succinate (TOPROL-XL) 50 MG 24 hr tablet TAKE ONE TABLET BY MOUTH DAILY 90 tablet 3   OVER THE COUNTER MEDICATION Take 8 g by mouth daily. Psyllium husk fiber (Patient not taking: Reported on 12/30/2021)     No current facility-administered medications for this encounter.    Physical Findings: The patient is in no acute distress. Patient is alert and oriented.  height is 6' (1.829 m) and weight is 172 lb 6 oz (78.2 kg). His temporal temperature is 96.3 F (35.7 C) (abnormal). His blood pressure is 115/70 and his pulse is 53 (abnormal). His respiration is 18 and oxygen saturation is 100%. .  No significant changes. Lungs are clear to auscultation bilaterally. Heart has regular rate and rhythm. No palpable cervical, supraclavicular, or axillary adenopathy. Abdomen soft, non-tender, normal bowel sounds.    Lab Findings: Lab Results  Component  Value Date   WBC 7.4 06/17/2020   HGB 15.0 06/17/2020   HCT 45.2 06/17/2020   MCV 97.4 06/17/2020   PLT 260 06/17/2020    Radiographic Findings: CT CHEST WO CONTRAST  Result Date: 12/24/2021 CLINICAL DATA:  Non-small cell lung cancer. Assess treatment response. * Tracking Code: BO * EXAM: CT CHEST WITHOUT CONTRAST TECHNIQUE: Multidetector CT imaging of the chest was performed following the standard protocol without IV contrast. RADIATION DOSE REDUCTION: This exam was performed according to the departmental dose-optimization program which includes automated exposure control, adjustment  of the mA and/or kV according to patient size and/or use of iterative reconstruction technique. COMPARISON:  None Available. FINDINGS: Cardiovascular: Abdominal aorta is normal caliber with atherosclerotic calcification. There is no retroperitoneal or periportal lymphadenopathy. No pelvic lymphadenopathy. Mediastinum/Nodes: No axillary or supraclavicular adenopathy. No mediastinal or hilar adenopathy. No pericardial fluid. Esophagus normal. Lungs/Pleura: Band of consolidation in the lingula not changed from prior. New nodularity in the LEFT or RIGHT lung. Upper Abdomen: Normal adrenal glands. Severe scarring/atrophy of the RIGHT kidney. Musculoskeletal: No aggressive osseous lesion. IMPRESSION: 1. Stable postsurgical change in the lingula. 2. No evidence of lung cancer recurrence or metastatic disease. Electronically Signed   By: Suzy Bouchard M.D.   On: 12/24/2021 21:28    Impression:  Stage IA3 (T1c,N0, M0) squamous cell carcinoma of the left upper lobe   No evidence of recurrence on clinical exam today.  Recent chest CT scan encouraging.  Plan: Routine follow-up in 6 months.  Prior to this follow-up appointment the patient will have a repeat chest CT scan.   25 minutes of total time was spent for this patient encounter, including preparation, face-to-face counseling with the patient and coordination of care, physical exam, and documentation of the encounter. ____________________________________  Richard Promise, PhD, MD  This document serves as a record of services personally performed by Gery Pray, MD. It was created on his behalf by Roney Mans, a trained medical scribe. The creation of this record is based on the scribe's personal observations and the provider's statements to them. This document has been checked and approved by the attending provider.

## 2021-12-30 ENCOUNTER — Ambulatory Visit
Admission: RE | Admit: 2021-12-30 | Discharge: 2021-12-30 | Disposition: A | Discharge status: Home / Self Care | Payer: Medicare PPO | Source: Ambulatory Visit | Attending: Radiation Oncology | Admitting: Radiation Oncology

## 2021-12-30 ENCOUNTER — Other Ambulatory Visit: Payer: Self-pay

## 2021-12-30 VITALS — BP 115/70 | HR 53 | Temp 96.3°F | Resp 18 | Ht 72.0 in | Wt 172.4 lb

## 2021-12-30 DIAGNOSIS — R918 Other nonspecific abnormal finding of lung field: Secondary | ICD-10-CM

## 2021-12-30 DIAGNOSIS — C3412 Malignant neoplasm of upper lobe, left bronchus or lung: Secondary | ICD-10-CM | POA: Diagnosis not present

## 2021-12-30 NOTE — Progress Notes (Signed)
Blanchard Era Skeen. is here today for follow up post radiation to the lung.  Lung Side: Left, patient completed treatment on 02/07/20.  Does the patient complain of any of the following: Pain:no Shortness of breath w/wo exertion: some with exertion Cough: no Hemoptysis: no Pain with swallowing: no Swallowing/choking concerns: no Appetite: not as good - has lost 20 lbs over 6 months Energy Level: good Post radiation skin Changes: no    Additional comments if applicable: Had Covid in March with sore throat and fatigue only.  BP 115/70 (BP Location: Left Arm, Patient Position: Sitting)   Pulse (!) 53   Temp (!) 96.3 F (35.7 C) (Temporal)   Resp 18   Ht 6' (1.829 m)   Wt 172 lb 6 oz (78.2 kg)   SpO2 100%   BMI 23.38 kg/m

## 2022-01-17 DIAGNOSIS — R3912 Poor urinary stream: Secondary | ICD-10-CM | POA: Diagnosis not present

## 2022-01-17 DIAGNOSIS — N401 Enlarged prostate with lower urinary tract symptoms: Secondary | ICD-10-CM | POA: Diagnosis not present

## 2022-01-17 DIAGNOSIS — N3020 Other chronic cystitis without hematuria: Secondary | ICD-10-CM | POA: Diagnosis not present

## 2022-01-26 DIAGNOSIS — L821 Other seborrheic keratosis: Secondary | ICD-10-CM | POA: Diagnosis not present

## 2022-03-01 DIAGNOSIS — H26493 Other secondary cataract, bilateral: Secondary | ICD-10-CM | POA: Diagnosis not present

## 2022-03-01 DIAGNOSIS — H524 Presbyopia: Secondary | ICD-10-CM | POA: Diagnosis not present

## 2022-03-01 DIAGNOSIS — H353131 Nonexudative age-related macular degeneration, bilateral, early dry stage: Secondary | ICD-10-CM | POA: Diagnosis not present

## 2022-04-23 ENCOUNTER — Other Ambulatory Visit: Payer: Self-pay | Admitting: Cardiology

## 2022-04-23 DIAGNOSIS — I480 Paroxysmal atrial fibrillation: Secondary | ICD-10-CM

## 2022-04-25 NOTE — Telephone Encounter (Signed)
Prescription refill request for Eliquis received. Indication:Afib Last office visit:upcoming FBP:ZWC[facility] labs Age: 88 Weight:78.2 kg  Prescription refilled

## 2022-05-13 ENCOUNTER — Telehealth: Payer: Self-pay | Admitting: Cardiology

## 2022-05-13 DIAGNOSIS — I4821 Permanent atrial fibrillation: Secondary | ICD-10-CM

## 2022-05-13 NOTE — Telephone Encounter (Addendum)
Prescription refill request for Eliquis received. Indication: afib  Last office visit: 02/24/2021, [place] Scr:  1.77, 03/05/2021 Age: 88 yo  Weight:  78.2 kg   Prescription sent in on 04/25/2022. [facility] and confirmed they had the Eliquis prescription and could fill for pt.   Called and spoke to pt and made him aware that he is due to have blood work to make sure he is on the correct dose of Eliquis. Informed him that he could come any time no appointment to get blood work. Orders placed  for cbc and bmet.   Pt is scheduled to see Dr. Stanford Breed 08/2022.

## 2022-05-13 NOTE — Telephone Encounter (Signed)
*  STAT* If patient is at the pharmacy, call can be transferred to refill team.   1. Which medications need to be refilled? (please list name of each medication and dose if known)  apixaban (ELIQUIS) 2.5 MG TABS tablet  2. Which pharmacy/location (including street and city if local pharmacy) is medication to be sent to? [facility] [id number] - [place], [place] [address]  3. Do they need a 30 day or 90 day supply? 90 day   Patient has 2 days left

## 2022-06-17 ENCOUNTER — Telehealth: Payer: Self-pay | Admitting: *Deleted

## 2022-06-17 NOTE — Telephone Encounter (Signed)
Called patient to inform of CT for 07-01-22- arrival time- 12:15 pm @ [facility], no restrictions to test, patient to receive results from Dr. Sondra Come on 07/04/22 @ 11:30 am, lvm for a return call

## 2022-06-20 ENCOUNTER — Telehealth: Payer: Self-pay | Admitting: *Deleted

## 2022-06-20 NOTE — Telephone Encounter (Signed)
CALLED PATIENT TO INFORM OF CT FOR 07-01-22 @ [facility], NO RESTRICTIONS TO TEST, ARRIVAL TIME- 12:15 PM, PATIENT TO RECEIVE RESULTS FROM DR. KINARD ON 07-04-22 @ 11:30 AM , SPOKE WITH PATIENT AND HE IS AWARE OF THESE APPTS. AND THE INSTRUCTIONS

## 2022-07-01 ENCOUNTER — Ambulatory Visit (HOSPITAL_COMMUNITY)
Admission: RE | Admit: 2022-07-01 | Discharge: 2022-07-01 | Disposition: A | Discharge status: Home / Self Care | Payer: Medicare PPO | Source: Ambulatory Visit | Attending: Radiation Oncology | Admitting: Radiation Oncology

## 2022-07-01 DIAGNOSIS — C3490 Malignant neoplasm of unspecified part of unspecified bronchus or lung: Secondary | ICD-10-CM | POA: Diagnosis not present

## 2022-07-01 DIAGNOSIS — C3412 Malignant neoplasm of upper lobe, left bronchus or lung: Secondary | ICD-10-CM | POA: Diagnosis not present

## 2022-07-01 DIAGNOSIS — J984 Other disorders of lung: Secondary | ICD-10-CM | POA: Diagnosis not present

## 2022-07-01 DIAGNOSIS — J439 Emphysema, unspecified: Secondary | ICD-10-CM | POA: Diagnosis not present

## 2022-07-02 NOTE — Progress Notes (Signed)
  Radiation Oncology         [phone]) [phone] ________________________________  Name: Richard Vincent. MRN: [id number]  Date: 07/04/2022  DOB: [birth year]  Follow-Up Visit Note  CC: Richard Sauer, MD  Richard Nash, DO  No diagnosis found.  Diagnosis: Stage IA3 (T1c,N0, M0) squamous cell carcinoma of the left upper lobe    Interval Since Last Radiation: 2 years, 4 months and 25 days   Radiation Treatment Dates: 01/29/2020 through 02/07/2020 Site Technique Total Dose (Gy) Dose per Fx (Gy) Completed Fx Beam Energies  Lung, Left: Lung_Lt IMRT 60/60 12 5/5 6XFFF   Narrative:  The patient returns today for routine follow-up and to review recent imaging. He was last seen here for follow-up on 12/30/21. His most recent chest CT without contrast on 07/01/22 showed ***.       Otherwise, no significant interval history since the patient was last seen.   ***                       Allergies:  is allergic to latex.  Meds: Current Outpatient Medications  Medication Sig Dispense Refill   amLODipine (NORVASC) 10 MG tablet TAKE ONE TABLET BY MOUTH DAILY 90 tablet 3   apixaban (ELIQUIS) 2.5 MG TABS tablet TAKE ONE TABLET BY MOUTH TWICE A DAY 180 tablet 0   ascorbic acid (VITAMIN C) 500 MG tablet Take 500 mg by mouth every evening.     cholecalciferol (VITAMIN D3) 25 MCG (1000 UNIT) tablet Take 1,000 Units by mouth in the morning and at bedtime.     Investigational - Study Medication Take 1 tablet by mouth daily. Preventable study drug: Atorvastatin 40 mg or placebo     metoprolol succinate (TOPROL-XL) 50 MG 24 hr tablet TAKE ONE TABLET BY MOUTH DAILY 90 tablet 3   OVER THE COUNTER MEDICATION Take 8 g by mouth daily. Psyllium husk fiber (Patient not taking: Reported on 12/30/2021)     No current facility-administered medications for this encounter.    Physical Findings: The patient is in no acute distress. Patient is alert and oriented.  vitals were not taken for this visit. .  No  significant changes. Lungs are clear to auscultation bilaterally. Heart has regular rate and rhythm. No palpable cervical, supraclavicular, or axillary adenopathy. Abdomen soft, non-tender, normal bowel sounds.   Lab Findings: Lab Results  Component Value Date   WBC 7.4 06/17/2020   HGB 15.0 06/17/2020   HCT 45.2 06/17/2020   MCV 97.4 06/17/2020   PLT 260 06/17/2020    Radiographic Findings: No results found.  Impression:  Stage IA3 (T1c,N0, M0) squamous cell carcinoma of the left upper lobe    The patient is recovering from the effects of radiation.  ***  Plan:  ***   *** minutes of total time was spent for this patient encounter, including preparation, face-to-face counseling with the patient and coordination of care, physical exam, and documentation of the encounter. ____________________________________  Richard Promise, PhD, MD  This document serves as a record of services personally performed by Gery Pray, MD. It was created on his behalf by Roney Mans, a trained medical scribe. The creation of this record is based on the scribe's personal observations and the provider's statements to them. This document has been checked and approved by the attending provider.

## 2022-07-04 ENCOUNTER — Ambulatory Visit
Admission: RE | Admit: 2022-07-04 | Discharge: 2022-07-04 | Disposition: A | Discharge status: Home / Self Care | Payer: Medicare PPO | Source: Ambulatory Visit | Attending: Radiation Oncology | Admitting: Radiation Oncology

## 2022-07-04 ENCOUNTER — Encounter: Payer: Self-pay | Admitting: Radiation Oncology

## 2022-07-04 VITALS — BP 118/61 | HR 59 | Temp 96.9°F | Resp 18 | Ht 72.0 in | Wt 178.1 lb

## 2022-07-04 DIAGNOSIS — C3412 Malignant neoplasm of upper lobe, left bronchus or lung: Secondary | ICD-10-CM

## 2022-07-04 DIAGNOSIS — Z923 Personal history of irradiation: Secondary | ICD-10-CM | POA: Insufficient documentation

## 2022-07-04 DIAGNOSIS — Z7901 Long term (current) use of anticoagulants: Secondary | ICD-10-CM | POA: Insufficient documentation

## 2022-07-04 DIAGNOSIS — Z79899 Other long term (current) drug therapy: Secondary | ICD-10-CM | POA: Diagnosis not present

## 2022-07-04 DIAGNOSIS — Z85118 Personal history of other malignant neoplasm of bronchus and lung: Secondary | ICD-10-CM | POA: Diagnosis not present

## 2022-07-04 NOTE — Progress Notes (Signed)
Richard Vincent. is here today for follow up post radiation to the lung.  Lung Side: Left,patient completed treatment on 02/07/20.  Does the patient complain of any of the following: Pain: No Shortness of breath w/wo exertion: with exertion only Cough: occ productive cough Hemoptysis: no Pain with swallowing: no Swallowing/choking concerns: no Appetite: good currently Energy Level: good Post radiation skin Changes: no Recent weight loss due to change in diet    Additional comments if applicable:

## 2022-07-08 ENCOUNTER — Telehealth: Payer: Self-pay | Admitting: Cardiology

## 2022-07-08 DIAGNOSIS — I10 Essential (primary) hypertension: Secondary | ICD-10-CM

## 2022-07-08 DIAGNOSIS — I480 Paroxysmal atrial fibrillation: Secondary | ICD-10-CM

## 2022-07-08 NOTE — Telephone Encounter (Signed)
*  STAT* If patient is at the pharmacy, call can be transferred to refill team.   1. Which medications need to be refilled? (please list name of each medication and dose if known)   amLODipine (NORVASC) 10 MG tablet  apixaban (ELIQUIS) 2.5 MG TABS tablet   2. Which pharmacy/location (including street and city if local pharmacy) is medication to be sent to?  [facility] [id number] - [place], [place] [address]   3. Do they need a 30 day or 90 day supply? 90 day   Patient states he is running out of these medications.

## 2022-07-12 MED ORDER — AMLODIPINE BESYLATE 10 MG PO TABS: 10.0000 mg | ORAL_TABLET | Freq: Every day | ORAL | 3 refills | Status: DC

## 2022-07-12 MED ORDER — APIXABAN 2.5 MG PO TABS: 2.5000 mg | ORAL_TABLET | Freq: Two times a day (BID) | ORAL | 0 refills | Status: DC

## 2022-08-07 ENCOUNTER — Other Ambulatory Visit: Payer: Self-pay | Admitting: Cardiology

## 2022-08-07 DIAGNOSIS — I480 Paroxysmal atrial fibrillation: Secondary | ICD-10-CM

## 2022-08-08 NOTE — Telephone Encounter (Addendum)
Prescription refill request for Eliquis received. Indication: AF  Last office visit: 02/24/21  Velta Addison MD  (Appt 08/24/22) Scr: 1.81 on 04/15/21 (labs at upcoming appt) Age:  89 Weight: 85kg   Based on above findings Eliquis 2.5mg  twice daily is the appropriate dose.  Refill approved.

## 2022-08-11 NOTE — Progress Notes (Signed)
HPI: FU atrial fibrillation. Nuclear study December 2016 showed ejection fraction 53%, prior infarct but no ischemia. Patient found to be in atrial fibrillation following surgical procedure in July 2018 at [facility]. Echo July 2018 showed normal LV function, grade 2 diastolic dysfunction, mild mitral regurgitation and mild left atrial enlargement. Plan was to proceed with cardioversion if atrial fibrillation persisted as this may have been postoperative atrial fibrillation.  However he converted spontaneously but atrial fibrillation recurred.  Patient had [facility] screening in September 2019 that showed mild carotid artery disease bilaterally, no abdominal aortic aneurysm and normal ABIs.  Since I last saw him, patient denies dyspnea, chest pain, palpitations, syncope, bleeding.  Current Outpatient Medications  Medication Sig Dispense Refill   amLODipine (NORVASC) 10 MG tablet Take 1 tablet (10 mg total) by mouth daily. 90 tablet 3   ascorbic acid (VITAMIN C) 500 MG tablet Take 500 mg by mouth every evening.     cholecalciferol (VITAMIN D3) 25 MCG (1000 UNIT) tablet Take 1,000 Units by mouth in the morning and at bedtime.     ELIQUIS 2.5 MG TABS tablet TAKE 1 TABLET BY MOUTH TWICE A DAY 180 tablet 0   Investigational - Study Medication Take 1 tablet by mouth daily. Preventable study drug: Atorvastatin 40 mg or placebo     metoprolol succinate (TOPROL-XL) 50 MG 24 hr tablet TAKE ONE TABLET BY MOUTH DAILY 90 tablet 3   OVER THE COUNTER MEDICATION Take 8 g by mouth daily. Psyllium husk fiber     No current facility-administered medications for this visit.     Past Medical History:  Diagnosis Date   Asthma    childhood    Basal cell cancer    nose   BPH (benign prostatic hyperplasia)    GERD (gastroesophageal reflux disease)    occ   Gout    changed diet years ago ; no gout attacks since    History of radiation therapy 01/29/2020-02/07/2020   SBRT to left lung       Dr Gery Pray   Hypertension    Lung cancer [facility]) 2021   Paroxysmal A-fib ([place])    occurred post-operatively ; last EKG he was in NSR    Renal insufficiency    non problematic     Past Surgical History:  Procedure Laterality Date   BRONCHIAL BIOPSY  11/12/2019   Procedure: BRONCHIAL BIOPSIES;  Surgeon: [name], DO;  Location: [facility];  Service: Pulmonary;;   BRONCHIAL BRUSHINGS  11/12/2019   Procedure: BRONCHIAL BRUSHINGS;  Surgeon: [name], DO;  Location: [facility];  Service: Pulmonary;;   BRONCHIAL WASHINGS  11/12/2019   Procedure: BRONCHIAL WASHINGS;  Surgeon: [name], DO;  Location: [facility];  Service: Pulmonary;;   ENDOBRONCHIAL ULTRASOUND  11/12/2019   Procedure: ENDOBRONCHIAL ULTRASOUND;  Surgeon: [name], DO;  Location: [facility];  Service: Pulmonary;;   EYE SURGERY  about 5-6 years ago    cataracts bilateral    FIDUCIAL MARKER PLACEMENT  11/12/2019   Procedure: FIDUCIAL MARKER PLACEMENT;  Surgeon: [name], DO;  Location: [facility];  Service: Pulmonary;;   FINE NEEDLE ASPIRATION  11/12/2019   Procedure: FINE NEEDLE ASPIRATION (FNA) LINEAR;  Surgeon: [name], DO;  Location: [facility];  Service: Pulmonary;;   NOSE SURGERY     Skin cancer removed     TONSILLECTOMY     TRANSURETHRAL RESECTION OF PROSTATE     VIDEO BRONCHOSCOPY WITH ENDOBRONCHIAL  NAVIGATION N/A 11/12/2019   Procedure: VIDEO BRONCHOSCOPY WITH ENDOBRONCHIAL NAVIGATION;  Surgeon: [name], DO;  Location: [facility];  Service: Pulmonary;  Laterality: N/A;   XI ROBOTIC ASSISTED SIMPLE PROSTATECTOMY N/A 12/14/2017   Procedure: XI ROBOTIC ASSISTED SIMPLE PROSTATECTOMY;  Surgeon: Cleon Gustin, MD;  Location: [facility];  Service: Urology;  Laterality: N/A;    Social History   Socioeconomic History   Marital status: Married    Spouse name: Not on file   Number of children: 6   Years of education: Not on file   Highest education level: Not on  file  Occupational History    Comment: Engineer  Tobacco Use   Smoking status: Former    Packs/day: 1.00    Years: 30.00    Total pack years: 30.00    Types: Cigarettes    Quit date: 10/17/1990    Years since quitting: 31.8   Smokeless tobacco: Never  Vaping Use   Vaping Use: Never used  Substance and Sexual Activity   Alcohol use: Yes    Alcohol/week: 1.0 - 2.0 standard drink of alcohol    Types: 1 - 2 Glasses of wine per week    Comment: every other day   Drug use: No   Sexual activity: Not on file  Other Topics Concern   Not on file  Social History Narrative   Not on file   Social Determinants of Health   Financial Resource Strain: Not on file  Food Insecurity: Not on file  Transportation Needs: Not on file  Physical Activity: Not on file  Stress: Not on file  Social Connections: Not on file  Intimate Partner Violence: Not on file    Family History  Problem Relation Age of Onset   Coronary artery disease Other        No family history    ROS: no fevers or chills, productive cough, hemoptysis, dysphasia, odynophagia, melena, hematochezia, dysuria, hematuria, rash, seizure activity, orthopnea, PND, pedal edema, claudication. Remaining systems are negative.  Physical Exam: Well-developed well-nourished in no acute distress.  Skin is warm and dry.  HEENT is normal.  Neck is supple.  Chest is clear to auscultation with normal expansion.  Cardiovascular exam is irregular Abdominal exam nontender or distended. No masses palpated. Extremities show no edema. neuro grossly intact  ECG-atrial fibrillation at a rate of 67, RV conduction delay, right axis deviation.  Personally reviewed  A/P  1 permanent atrial fibrillation-continue beta-blocker for rate control.  Continue apixaban.  Check hemoglobin and renal function.  2 hypertension-blood pressure controlled.  Continue present medications and follow-up.  3 history of palpitations-continue beta-blocker.  Symptoms  are controlled.  Darrell Bliesner present during exam  Kirk Ruths, MD

## 2022-08-24 ENCOUNTER — Encounter: Payer: Self-pay | Admitting: Cardiology

## 2022-08-24 ENCOUNTER — Ambulatory Visit: Payer: Medicare PPO | Attending: Cardiology | Admitting: Cardiology

## 2022-08-24 VITALS — BP 108/60 | HR 67 | Ht 73.0 in | Wt 173.0 lb

## 2022-08-24 DIAGNOSIS — I4821 Permanent atrial fibrillation: Secondary | ICD-10-CM | POA: Diagnosis not present

## 2022-08-24 DIAGNOSIS — R002 Palpitations: Secondary | ICD-10-CM

## 2022-08-24 DIAGNOSIS — I10 Essential (primary) hypertension: Secondary | ICD-10-CM

## 2022-08-24 NOTE — Progress Notes (Signed)
Examination chaperoned by Maryan Rued, on 08/24/22 @ 1018.

## 2022-08-24 NOTE — Patient Instructions (Signed)
  Follow-Up: At [facility], you and your health needs are our priority.  As part of our continuing mission to provide you with exceptional heart care, we have created designated Provider Care Teams.  These Care Teams include your primary Cardiologist (physician) and Advanced Practice Providers (APPs -  Physician Assistants and Nurse Practitioners) who all work together to provide you with the care you need, when you need it.  We recommend signing up for the patient portal called "MyChart".  Sign up information is provided on this After Visit Summary.  MyChart is used to connect with patients for Virtual Visits (Telemedicine).  Patients are able to view lab/test results, encounter notes, upcoming appointments, etc.  Non-urgent messages can be sent to your provider as well.   To learn more about what you can do with MyChart, go to [email or web address].    Your next appointment:   12 month(s)  Provider:   Kirk Ruths MD

## 2022-08-25 LAB — BASIC METABOLIC PANEL
BUN/Creatinine Ratio: 19 (ref 10–24)
BUN: 35 mg/dL — ABNORMAL HIGH (ref 8–27)
CO2: 19 mmol/L — ABNORMAL LOW (ref 20–29)
Calcium: 8.9 mg/dL (ref 8.6–10.2)
Chloride: 105 mmol/L (ref 96–106)
Creatinine, Ser: 1.87 mg/dL — ABNORMAL HIGH (ref 0.76–1.27)
Glucose: 91 mg/dL (ref 70–99)
Potassium: 5.1 mmol/L (ref 3.5–5.2)
Sodium: 139 mmol/L (ref 134–144)
eGFR: 34 mL/min/{1.73_m2} — ABNORMAL LOW (ref >59)

## 2022-08-25 LAB — CBC
Hematocrit: 43.4 % (ref 37.5–51.0)
Hemoglobin: 14.8 g/dL (ref 13.0–17.7)
MCH: 31 pg (ref 26.6–33.0)
MCHC: 34.1 g/dL (ref 31.5–35.7)
MCV: 91 fL (ref 79–97)
Platelets: 269 10*3/uL (ref 150–450)
RBC: 4.78 x10E6/uL (ref 4.14–5.80)
RDW: 11.5 % — ABNORMAL LOW (ref 11.6–15.4)
WBC: 6.1 10*3/uL (ref 3.4–10.8)

## 2022-10-21 ENCOUNTER — Telehealth: Payer: Self-pay

## 2022-10-21 NOTE — Patient Outreach (Signed)
  Care Coordination   Initial Visit Note   10/21/2022 Name: Richard Vincent. MRN: [id number] DOB: 09/17/1931  Richard Vincent. is a 89 y.o. year old male who sees Richard Carson, MD for primary care. I spoke with  Richard Vincent. by phone today.  What matters to the patients health and wellness today?  Richard Vincent reports he is doing very good for his age. He reports he is able to get around without difficulty. He is sleeping well. He denies any care coordination, educational or resource needs at this time. Richard Vincent request brochure if future needs.  Goals Addressed             This Visit's Progress    COMPLETED: Care Coordination Activities       Interventions Today    Flowsheet Row Most Recent Value  Chronic Disease   Chronic disease during today's visit Chronic Obstructive Pulmonary Disease (COPD), Hypertension (HTN)  General Interventions   General Interventions Discussed/Reviewed General Interventions Discussed, Doctor Visits  [discussed care coordination program. [facility] brochure provided vis mail]  Education Interventions   Education Provided Provided Education  Provided Verbal Education On When to see the doctor  [encuraged to contact provider with any health questions or concerns]  Pharmacy Interventions   Pharmacy Dicussed/Reviewed Pharmacy Topics Discussed, Medications and their functions, Medication Adherence, Affording Medications            SDOH assessments and interventions completed:  Yes  SDOH Interventions Today    Flowsheet Row Most Recent Value  SDOH Interventions   Food Insecurity Interventions Intervention Not Indicated  Housing Interventions Intervention Not Indicated  Transportation Interventions Intervention Not Indicated  Utilities Interventions Intervention Not Indicated     Care Coordination Interventions:  Yes, provided   Follow up plan: No further intervention required.   Encounter Outcome:  Pt. Visit Completed   Kathyrn Sheriff,  RN, MSN, BSN, CCM [facility] Care Coordinator [phone]

## 2022-10-22 ENCOUNTER — Other Ambulatory Visit: Payer: Self-pay | Admitting: Cardiology

## 2022-10-26 ENCOUNTER — Telehealth: Payer: Self-pay | Admitting: Cardiology

## 2022-10-26 NOTE — Telephone Encounter (Signed)
Spoke with pt, aware he had those test done in 2019 and he does not need to do them again.

## 2022-10-26 NOTE — Telephone Encounter (Signed)
Patient came in with a screenining package form for a Carotid Artery Disease Screening, Peripheral Artery Disease Screening, Abdominal Aortic Aneurysm Screening and Atrial Fibrillation Screening and wanted to know if this is something Dr. Jens Som thinks would be helpful? CB # [phone] FYI: faxed over the form to the [facility] fax [facility]

## 2022-12-19 ENCOUNTER — Telehealth: Payer: Self-pay | Admitting: *Deleted

## 2022-12-19 NOTE — Telephone Encounter (Signed)
CALLED PATIENT TO INFORM OF CT FOR 01-03-23- ARRIVAL TIME- 3:45 PM @ [facility], NO RESTRICTIONS TO TEST, PATIENT TO RECEIVE RESULTS FROM DR., Roselind Messier ON 01-05-23 @ 11:30 AM, SPOKE WITH PATIENT AND HE IS AWARE OF THIESE APPTS. AND THE RESTRICTIONS

## 2023-01-03 ENCOUNTER — Ambulatory Visit (HOSPITAL_COMMUNITY)
Admission: RE | Admit: 2023-01-03 | Discharge: 2023-01-03 | Disposition: A | Discharge status: Home / Self Care | Payer: Medicare PPO | Source: Ambulatory Visit | Attending: Radiation Oncology | Admitting: Radiation Oncology

## 2023-01-03 DIAGNOSIS — C3412 Malignant neoplasm of upper lobe, left bronchus or lung: Secondary | ICD-10-CM | POA: Insufficient documentation

## 2023-01-03 DIAGNOSIS — J439 Emphysema, unspecified: Secondary | ICD-10-CM | POA: Diagnosis not present

## 2023-01-03 DIAGNOSIS — C3490 Malignant neoplasm of unspecified part of unspecified bronchus or lung: Secondary | ICD-10-CM | POA: Diagnosis not present

## 2023-01-04 NOTE — Progress Notes (Signed)
Radiation Oncology         [phone]) [phone] ________________________________  Name: Richard Vincent. MRN: [id number]  Date: 01/05/2023  DOB: 09-29-1931  Follow-Up Visit Note  CC: Herma Carson, MD  Josephine Igo, DO  No diagnosis found.  Diagnosis: Stage IA3 (T1c,N0, M0) squamous cell carcinoma of the left upper lobe    Interval Since Last Radiation: 2 years, 10 months, and 28 days   Radiation Treatment Dates: 01/29/2020 through 02/07/2020 Site Technique Total Dose (Gy) Dose per Fx (Gy) Completed Fx Beam Energies  Lung, Left: Lung_Lt IMRT 60/60 12 5/5 6XFFF   Narrative:  The patient returns today for routine follow-up and to review recent imaging. He was last seen here for follow-up on 07/04/22.      His most recent chest CT on 01/03/23 showed no definitive evidence of recurrent or metastatic disease. A 2 mm LLL nodule was also noted which was not appreciated on prior exams. The other bilateral pulmonary nodules otherwise appeared stable.     No other significant interval history since the patient was last seen for follow-up.   ***                           Allergies:  is allergic to latex.  Meds: Current Outpatient Medications  Medication Sig Dispense Refill   amLODipine (NORVASC) 10 MG tablet Take 1 tablet (10 mg total) by mouth daily. 90 tablet 3   ascorbic acid (VITAMIN C) 500 MG tablet Take 500 mg by mouth every evening.     cholecalciferol (VITAMIN D3) 25 MCG (1000 UNIT) tablet Take 1,000 Units by mouth in the morning and at bedtime.     ELIQUIS 2.5 MG TABS tablet TAKE 1 TABLET BY MOUTH TWICE A DAY 180 tablet 0   Investigational - Study Medication Take 1 tablet by mouth daily. Preventable study drug: Atorvastatin 40 mg or placebo     metoprolol succinate (TOPROL-XL) 50 MG 24 hr tablet TAKE ONE TABLET BY MOUTH DAILY 90 tablet 3   OVER THE COUNTER MEDICATION Take 8 g by mouth daily. Psyllium husk fiber (Patient not taking: Reported on 10/21/2022)     No current  facility-administered medications for this encounter.    Physical Findings: The patient is in no acute distress. Patient is alert and oriented.  vitals were not taken for this visit. .  No significant changes. Lungs are clear to auscultation bilaterally. Heart has regular rate and rhythm. No palpable cervical, supraclavicular, or axillary adenopathy. Abdomen soft, non-tender, normal bowel sounds.   Lab Findings: Lab Results  Component Value Date   WBC 6.1 08/24/2022   HGB 14.8 08/24/2022   HCT 43.4 08/24/2022   MCV 91 08/24/2022   PLT 269 08/24/2022    Radiographic Findings: CT CHEST WO CONTRAST  Result Date: 01/04/2023 CLINICAL DATA:  Non-small cell lung cancer.  * Tracking Code: BO * EXAM: CT CHEST WITHOUT CONTRAST TECHNIQUE: Multidetector CT imaging of the chest was performed following the standard protocol without IV contrast. RADIATION DOSE REDUCTION: This exam was performed according to the departmental dose-optimization program which includes automated exposure control, adjustment of the mA and/or kV according to patient size and/or use of iterative reconstruction technique. COMPARISON:  07/01/2022. FINDINGS: Cardiovascular: Atherosclerotic calcification of the aorta, aortic valve and coronary arteries. Enlarged pulmonic trunk and heart. No pericardial effusion. Mediastinum/Nodes: No pathologically enlarged mediastinal or axillary lymph nodes. Hilar regions are difficult to definitively evaluate without IV contrast. Esophagus  is grossly unremarkable. Lungs/Pleura: Centrilobular emphysema. Post treatment architectural distortion and bronchiectasis in the lingula. 2 mm left lower lobe nodule (8/143), not well seen on the prior exam. Otherwise, a few scattered pulmonary nodules measure up to 4 mm in the right lower lobe (8/68), as before. No pleural fluid. Airway is unremarkable. Upper Abdomen: Probable scattered hepatic cysts. Stones in the gallbladder. Adrenal glands are unremarkable.  Right kidney is atrophic. Low-attenuation lesions in the kidneys. No specific follow-up necessary. Spleen, pancreas, stomach and visualized bowel are grossly unremarkable with the exception of a small hiatal hernia. No upper abdominal adenopathy. Musculoskeletal: Degenerative changes in the spine. Flowing anterior osteophytosis in the thoracic spine. No worrisome lytic or sclerotic lesions. IMPRESSION: 1. No definitive evidence of recurrent or metastatic disease. 2. 2 mm left lower lobe nodule, not well seen on prior exams. Recommend attention on follow-up. Other pulmonary nodules are stable. 3. Cholelithiasis. 4. Aortic atherosclerosis (ICD10-I70.0). Coronary artery calcification. 5. Enlarged pulmonic trunk, indicative of pulmonary arterial hypertension. 6.  Emphysema (ICD10-J43.9). Electronically Signed   By: Leanna Battles M.D.   On: 01/04/2023 08:44    Impression: Stage IA3 (T1c,N0, M0) squamous cell carcinoma of the left upper lobe    The patient is recovering from the effects of radiation.  ***  Plan:  ***   *** minutes of total time was spent for this patient encounter, including preparation, face-to-face counseling with the patient and coordination of care, physical exam, and documentation of the encounter. ____________________________________  Billie Lade, PhD, MD  This document serves as a record of services personally performed by Antony Blackbird, MD. It was created on his behalf by Neena Rhymes, a trained medical scribe. The creation of this record is based on the scribe's personal observations and the provider's statements to them. This document has been checked and approved by the attending provider.

## 2023-01-05 ENCOUNTER — Other Ambulatory Visit: Payer: Self-pay

## 2023-01-05 ENCOUNTER — Ambulatory Visit
Admission: RE | Admit: 2023-01-05 | Discharge: 2023-01-05 | Disposition: A | Discharge status: Home / Self Care | Payer: Medicare PPO | Source: Ambulatory Visit | Attending: Radiation Oncology | Admitting: Radiation Oncology

## 2023-01-05 ENCOUNTER — Encounter: Payer: Self-pay | Admitting: Radiation Oncology

## 2023-01-05 VITALS — BP 112/56 | HR 43 | Temp 97.1°F | Resp 18 | Ht 73.0 in | Wt 173.5 lb

## 2023-01-05 DIAGNOSIS — J479 Bronchiectasis, uncomplicated: Secondary | ICD-10-CM | POA: Insufficient documentation

## 2023-01-05 DIAGNOSIS — I700 Atherosclerosis of aorta: Secondary | ICD-10-CM | POA: Diagnosis not present

## 2023-01-05 DIAGNOSIS — Z85118 Personal history of other malignant neoplasm of bronchus and lung: Secondary | ICD-10-CM | POA: Insufficient documentation

## 2023-01-05 DIAGNOSIS — Z923 Personal history of irradiation: Secondary | ICD-10-CM | POA: Insufficient documentation

## 2023-01-05 DIAGNOSIS — J432 Centrilobular emphysema: Secondary | ICD-10-CM | POA: Insufficient documentation

## 2023-01-05 DIAGNOSIS — Z7901 Long term (current) use of anticoagulants: Secondary | ICD-10-CM | POA: Insufficient documentation

## 2023-01-05 DIAGNOSIS — I517 Cardiomegaly: Secondary | ICD-10-CM | POA: Diagnosis not present

## 2023-01-05 DIAGNOSIS — Z87891 Personal history of nicotine dependence: Secondary | ICD-10-CM | POA: Insufficient documentation

## 2023-01-05 DIAGNOSIS — K8020 Calculus of gallbladder without cholecystitis without obstruction: Secondary | ICD-10-CM | POA: Diagnosis not present

## 2023-01-05 DIAGNOSIS — K449 Diaphragmatic hernia without obstruction or gangrene: Secondary | ICD-10-CM | POA: Diagnosis not present

## 2023-01-05 DIAGNOSIS — Z79899 Other long term (current) drug therapy: Secondary | ICD-10-CM | POA: Diagnosis not present

## 2023-01-05 DIAGNOSIS — C3412 Malignant neoplasm of upper lobe, left bronchus or lung: Secondary | ICD-10-CM | POA: Diagnosis not present

## 2023-01-05 NOTE — Progress Notes (Signed)
Richard Vincent. is here today for follow up post radiation to the lung.  Lung Side: Left, patient completed treatment on 02/07/20  Does the patient complain of any of the following: Pain: No Shortness of breath w/wo exertion: Yes, Mostly on exertion.  Cough: No Hemoptysis: No Pain with swallowing: No Swallowing/choking concerns: Choking at times.  Appetite: Good Weight:  Wt Readings from Last 3 Encounters:  01/05/23 173 lb 8 oz (78.7 kg)  08/24/22 173 lb (78.5 kg)  07/04/22 178 lb 2 oz (80.8 kg)   Energy Level: Good  Post radiation skin Changes: No    Additional comments if applicable:   BP (!) 112/56 (BP Location: Left Arm, Patient Position: Sitting)   Pulse (!) 43   Temp (!) 97.1 F (36.2 C) (Temporal)   Resp 18   Ht 6\' 1"  (1.854 m)   Wt 173 lb 8 oz (78.7 kg)   SpO2 100%   BMI 22.89 kg/m

## 2023-01-20 DIAGNOSIS — N3020 Other chronic cystitis without hematuria: Secondary | ICD-10-CM | POA: Diagnosis not present

## 2023-01-20 DIAGNOSIS — N401 Enlarged prostate with lower urinary tract symptoms: Secondary | ICD-10-CM | POA: Diagnosis not present

## 2023-01-20 DIAGNOSIS — R3912 Poor urinary stream: Secondary | ICD-10-CM | POA: Diagnosis not present

## 2023-02-13 ENCOUNTER — Other Ambulatory Visit: Payer: Self-pay | Admitting: Cardiology

## 2023-02-13 DIAGNOSIS — I480 Paroxysmal atrial fibrillation: Secondary | ICD-10-CM

## 2023-02-13 NOTE — Telephone Encounter (Signed)
Prescription refill request for Eliquis received. Indication:afib Last office visit:5/24 Scr:1.87  2/24 Age: 89 Weight:78.7  kg  Prescription refilled

## 2023-03-22 ENCOUNTER — Encounter (HOSPITAL_BASED_OUTPATIENT_CLINIC_OR_DEPARTMENT_OTHER): Payer: Self-pay | Admitting: Pediatrics

## 2023-03-22 ENCOUNTER — Emergency Department (HOSPITAL_BASED_OUTPATIENT_CLINIC_OR_DEPARTMENT_OTHER)
Admission: EM | Admit: 2023-03-22 | Discharge: 2023-03-22 | Disposition: A | Discharge status: Home / Self Care | Payer: Medicare PPO | Attending: Emergency Medicine | Admitting: Emergency Medicine

## 2023-03-22 ENCOUNTER — Other Ambulatory Visit: Payer: Self-pay

## 2023-03-22 ENCOUNTER — Emergency Department (HOSPITAL_BASED_OUTPATIENT_CLINIC_OR_DEPARTMENT_OTHER): Payer: Medicare PPO

## 2023-03-22 DIAGNOSIS — J45909 Unspecified asthma, uncomplicated: Secondary | ICD-10-CM | POA: Insufficient documentation

## 2023-03-22 DIAGNOSIS — M7640 Tibial collateral bursitis [Pellegrini-Stieda], unspecified leg: Secondary | ICD-10-CM | POA: Diagnosis not present

## 2023-03-22 DIAGNOSIS — Z85118 Personal history of other malignant neoplasm of bronchus and lung: Secondary | ICD-10-CM | POA: Insufficient documentation

## 2023-03-22 DIAGNOSIS — Z91040 Latex allergy status: Secondary | ICD-10-CM | POA: Insufficient documentation

## 2023-03-22 DIAGNOSIS — I10 Essential (primary) hypertension: Secondary | ICD-10-CM | POA: Insufficient documentation

## 2023-03-22 DIAGNOSIS — M7641 Tibial collateral bursitis [Pellegrini-Stieda], right leg: Secondary | ICD-10-CM | POA: Diagnosis not present

## 2023-03-22 DIAGNOSIS — Z79899 Other long term (current) drug therapy: Secondary | ICD-10-CM | POA: Insufficient documentation

## 2023-03-22 DIAGNOSIS — Z85828 Personal history of other malignant neoplasm of skin: Secondary | ICD-10-CM | POA: Diagnosis not present

## 2023-03-22 DIAGNOSIS — Z7901 Long term (current) use of anticoagulants: Secondary | ICD-10-CM | POA: Diagnosis not present

## 2023-03-22 DIAGNOSIS — M25561 Pain in right knee: Secondary | ICD-10-CM | POA: Diagnosis not present

## 2023-03-22 DIAGNOSIS — S80911A Unspecified superficial injury of right knee, initial encounter: Secondary | ICD-10-CM | POA: Diagnosis not present

## 2023-03-22 DIAGNOSIS — M25461 Effusion, right knee: Secondary | ICD-10-CM | POA: Diagnosis not present

## 2023-03-22 LAB — CBC WITH DIFFERENTIAL/PLATELET
Abs Immature Granulocytes: 0.05 10*3/uL (ref 0.00–0.07)
Basophils Absolute: 0 10*3/uL (ref 0.0–0.1)
Basophils Relative: 0 %
Eosinophils Absolute: 0 10*3/uL (ref 0.0–0.5)
Eosinophils Relative: 0 %
HCT: 42.5 % (ref 39.0–52.0)
Hemoglobin: 14.1 g/dL (ref 13.0–17.0)
Immature Granulocytes: 1 %
Lymphocytes Relative: 15 %
Lymphs Abs: 1.1 10*3/uL (ref 0.7–4.0)
MCH: 32 pg (ref 26.0–34.0)
MCHC: 33.2 g/dL (ref 30.0–36.0)
MCV: 96.6 fL (ref 80.0–100.0)
Monocytes Absolute: 1.4 10*3/uL — ABNORMAL HIGH (ref 0.1–1.0)
Monocytes Relative: 19 %
Neutro Abs: 4.7 10*3/uL (ref 1.7–7.7)
Neutrophils Relative %: 65 %
Platelets: 232 10*3/uL (ref 150–400)
RBC: 4.4 MIL/uL (ref 4.22–5.81)
RDW: 12.7 % (ref 11.5–15.5)
WBC: 7.2 10*3/uL (ref 4.0–10.5)
nRBC: 0 % (ref 0.0–0.2)

## 2023-03-22 LAB — COMPREHENSIVE METABOLIC PANEL
ALT: 14 U/L (ref 0–44)
AST: 28 U/L (ref 15–41)
Albumin: 3.6 g/dL (ref 3.5–5.0)
Alkaline Phosphatase: 63 U/L (ref 38–126)
Anion gap: 9 (ref 5–15)
BUN: 36 mg/dL — ABNORMAL HIGH (ref 8–23)
CO2: 23 mmol/L (ref 22–32)
Calcium: 9 mg/dL (ref 8.9–10.3)
Chloride: 107 mmol/L (ref 98–111)
Creatinine, Ser: 1.81 mg/dL — ABNORMAL HIGH (ref 0.61–1.24)
GFR, Estimated: 35 mL/min — ABNORMAL LOW (ref >60)
Glucose, Bld: 109 mg/dL — ABNORMAL HIGH (ref 70–99)
Potassium: 4.4 mmol/L (ref 3.5–5.1)
Sodium: 139 mmol/L (ref 135–145)
Total Bilirubin: 1 mg/dL (ref 0.3–1.2)
Total Protein: 7.7 g/dL (ref 6.5–8.1)

## 2023-03-22 LAB — C-REACTIVE PROTEIN: CRP: 17.9 mg/dL — ABNORMAL HIGH (ref <1.0)

## 2023-03-22 LAB — SEDIMENTATION RATE: Sed Rate: 61 mm/h — ABNORMAL HIGH (ref 0–16)

## 2023-03-22 MED ORDER — LIDOCAINE 5 % EX PTCH: 1.0000 | MEDICATED_PATCH | CUTANEOUS | 0 refills | Status: AC

## 2023-03-22 MED ORDER — DEXAMETHASONE 4 MG PO TABS
10.0000 mg | ORAL_TABLET | Freq: Once | ORAL | Status: AC
  Administered 2023-03-22: 10 mg via ORAL
  Filled 2023-03-22: qty 3

## 2023-03-22 NOTE — ED Triage Notes (Signed)
C/o right sided leg pain x 3-5 days with min relief from cold compress. Denies any recent fall.

## 2023-03-22 NOTE — ED Provider Notes (Signed)
[facility] Provider Note   CSN: [id number] Arrival date & time: 03/22/23  1144     History  Chief Complaint  Patient presents with   Leg Pain    Richard Vincent. is a 89 y.o. male.  HPI     89yo male with history of atrial fibrillation on eliquis, lung cancer, hypertension, who presents with concern for right knee pain.  Was lifting logs from cut tree in the yard 5 days developed worsening right sided knee pain.  Has been using ice for it at home with som relief ut symptoms worsening so came here Has difficulty getting up due to the pain, once he is up it is alittle better. Using a cane. Has not taken any medications yet.   Denies falls, headache, numbness, upper extremity weakness, abdominal pain, change in vision, difficulty talking.  Does report it is difficult to tell if it is weak or pain as it is difficult to move the right leg with the pain he has.  No fevers, no hx of joint infections. Otherwise in normal state of health. Thinks he might have been overdoing it lifting the logs in the yard. He has been adherent with his eliquis   Past Medical History:  Diagnosis Date   Asthma    childhood    Basal cell cancer    nose   BPH (benign prostatic hyperplasia)    GERD (gastroesophageal reflux disease)    occ   Gout    changed diet years ago ; no gout attacks since    History of radiation therapy 01/29/2020-02/07/2020   SBRT to left lung       Dr Antony Blackbird   Hypertension    Lung cancer [facility]) 2021   Paroxysmal A-fib (HCC)    occurred post-operatively ; last EKG he was in NSR    Renal insufficiency    non problematic      Home Medications Prior to Admission medications   Medication Sig Start Date End Date Taking? Authorizing Provider  lidocaine (LIDODERM) 5 % Place 1 patch onto the skin daily. Remove & Discard patch within 12 hours or as directed by MD 03/22/23  Yes Alvira Monday, MD  amLODipine (NORVASC) 10 MG  tablet Take 1 tablet (10 mg total) by mouth daily. 07/12/22   Lewayne Bunting, MD  apixaban (ELIQUIS) 2.5 MG TABS tablet TAKE 1 TABLET BY MOUTH TWICE A DAY 02/13/23   Lewayne Bunting, MD  ascorbic acid (VITAMIN C) 500 MG tablet Take 1,000 mg by mouth every evening.    [provider]  cholecalciferol (VITAMIN D3) 25 MCG (1000 UNIT) tablet Take 1,000 Units by mouth in the morning and at bedtime.    [provider]  Investigational - Study Medication Take 1 tablet by mouth daily. Preventable study drug: Atorvastatin 40 mg or placebo Patient not taking: Reported on 01/05/2023    [provider]  metoprolol succinate (TOPROL-XL) 50 MG 24 hr tablet TAKE ONE TABLET BY MOUTH DAILY 10/24/22   Lewayne Bunting, MD  OVER THE COUNTER MEDICATION Take 8 g by mouth daily. Psyllium husk fiber Patient not taking: Reported on 10/21/2022    [provider]      Allergies    Latex    Review of Systems   Review of Systems  Physical Exam Updated Vital Signs BP (!) 163/86   Pulse 88   Temp 97.6 F (36.4 C) (Oral)   Resp 17  Ht 6\' 1"  (1.854 m)   Wt 79.4 kg   SpO2 100%   BMI 23.09 kg/m  Physical Exam Vitals and nursing note reviewed.  Constitutional:      General: He is not in acute distress.    Appearance: Normal appearance. He is not ill-appearing, toxic-appearing or diaphoretic.  HENT:     Head: Normocephalic and atraumatic.  Eyes:     General: No visual field deficit.    Extraocular Movements: Extraocular movements intact.     Conjunctiva/sclera: Conjunctivae normal.     Pupils: Pupils are equal, round, and reactive to light.  Cardiovascular:     Rate and Rhythm: Normal rate. Rhythm irregular.     Pulses: Normal pulses.  Pulmonary:     Effort: Pulmonary effort is normal. No respiratory distress.  Musculoskeletal:        General: Swelling present. No tenderness, deformity or signs of injury.     Cervical back: Normal range of motion. No rigidity.      Right knee: Swelling and effusion present. No erythema, ecchymosis or lacerations. Decreased range of motion.  Skin:    General: Skin is warm and dry.     Coloration: Skin is not jaundiced or pale.     Findings: No erythema or rash.  Neurological:     General: No focal deficit present.     Mental Status: He is alert and oriented to person, place, and time.     GCS: GCS eye subscore is 4. GCS verbal subscore is 5. GCS motor subscore is 6.     Cranial Nerves: No cranial nerve deficit, dysarthria or facial asymmetry.     Sensory: No sensory deficit.     Motor: No weakness (difficult to assess strenght of RLE given pain with knee movements but proximal distal strength appears normal) or tremor.     Coordination: Coordination normal. Finger-Nose-Finger Test normal.     ED Results / Procedures / Treatments   Labs (all labs ordered are listed, but only abnormal results are displayed) Labs Reviewed  CBC WITH DIFFERENTIAL/PLATELET - Abnormal; Notable for the following components:      Result Value   Monocytes Absolute 1.4 (*)    All other components within normal limits  COMPREHENSIVE METABOLIC PANEL - Abnormal; Notable for the following components:   Glucose, Bld 109 (*)    BUN 36 (*)    Creatinine, Ser 1.81 (*)    GFR, Estimated 35 (*)    All other components within normal limits  SEDIMENTATION RATE - Abnormal; Notable for the following components:   Sed Rate 61 (*)    All other components within normal limits  C-REACTIVE PROTEIN    EKG None  Radiology DG Knee Complete 4 Views Right  Result Date: 03/22/2023 CLINICAL DATA:  Right knee pain for the past 3-5 days. No known injury. EXAM: RIGHT KNEE - COMPLETE 4+ VIEW COMPARISON:  None Available. FINDINGS: Small knee joint effusion. No fracture or dislocation. Enthesopathic change involving the superior aspect of the medial femoral condyle likely represents sequela of remote MCL injury (Pellegrini-Stieda). Right knee joint spaces appear  preserved. No evidence of chondrocalcinosis. Scattered adjacent vascular calcifications. Regional soft tissues appear otherwise normal. IMPRESSION: 1. Small knee joint effusion.  Otherwise, no acute findings. 2. Sequela of remote MCL injury (Pellegrini-Stieda). Electronically Signed   By: Simonne Come M.D.   On: 03/22/2023 14:54    Procedures Procedures    Medications Ordered in ED Medications  dexamethasone (DECADRON) tablet 10 mg (10  mg Oral Given 03/22/23 1537)    ED Course/ Medical Decision Making/ A&P      [phone] male with history of atrial fibrillation on eliquis, lung cancer, hypertension, who presents with concern for right knee pain.  Does not have areas of weakness on exam (other than pain in right leg/knee with movement), no numbness, low suspicion for acute CVA or lumbar spinal emergency.  Normal pulses palpated and confirmed with doppler signal and doubt acute arterial thrombus.  Low clinical suspicion for DVT given he is on eliquis, significant knee tenderness.  Labs copmleted and personally evaluated by me show no clinically significant electrolyte abnormalities, no anemia or luekocytosis.   Considered septic arthritis given painful and difficult passive ROM and ordered labs with initial plan for arthrocentesis.  XR completed shows a small effusion, sequela of remote MCL injury.  Re-considered risk vs benefit of arthrocentesis at this time--labs show no leukocytosis, he has no fever or systemic symptoms, no erythema of knee, no risk factors for bacteremia or history of joint infections and in setting of recent increased activity feel more likely effusion is secondary to internal knee/arthritis/meniscal or other abnormality and less likely due to infection and that pain of arthrocentesis and bleeding risk on eliquis not indicated at this time with low suspicion for infection. Did send esr/crp.  ESR returned as we were discharging and is elevated, however is nonspecific and still feel at  this time low suspicion for septic arthritis and more likely inflammatory.  Given a dose of decadron, knee sleeve, recommend using walker or cane, tylenol/lidocaine patch/ice and referred to Orthopedics. Discussed strict return precautions.  Patient discharged in stable condition with understanding of reasons to return.         Final Clinical Impression(s) / ED Diagnoses Final diagnoses:  Acute pain of right knee  Pellegrini-Steida syndrome, right    Rx / DC Orders ED Discharge Orders          Ordered    lidocaine (LIDODERM) 5 %  Every 24 hours        03/22/23 1516              Alvira Monday, MD 03/24/23 [phone]

## 2023-04-28 DIAGNOSIS — D6869 Other thrombophilia: Secondary | ICD-10-CM | POA: Diagnosis not present

## 2023-04-28 DIAGNOSIS — N1832 Chronic kidney disease, stage 3b: Secondary | ICD-10-CM | POA: Diagnosis not present

## 2023-04-28 DIAGNOSIS — I4891 Unspecified atrial fibrillation: Secondary | ICD-10-CM | POA: Diagnosis not present

## 2023-04-28 DIAGNOSIS — H3530 Unspecified macular degeneration: Secondary | ICD-10-CM | POA: Diagnosis not present

## 2023-04-28 DIAGNOSIS — I129 Hypertensive chronic kidney disease with stage 1 through stage 4 chronic kidney disease, or unspecified chronic kidney disease: Secondary | ICD-10-CM | POA: Diagnosis not present

## 2023-04-28 DIAGNOSIS — I700 Atherosclerosis of aorta: Secondary | ICD-10-CM | POA: Diagnosis not present

## 2023-04-28 DIAGNOSIS — Z7901 Long term (current) use of anticoagulants: Secondary | ICD-10-CM | POA: Diagnosis not present

## 2023-04-28 DIAGNOSIS — N2581 Secondary hyperparathyroidism of renal origin: Secondary | ICD-10-CM | POA: Diagnosis not present

## 2023-04-28 DIAGNOSIS — I2510 Atherosclerotic heart disease of native coronary artery without angina pectoris: Secondary | ICD-10-CM | POA: Diagnosis not present

## 2023-05-15 DIAGNOSIS — K5900 Constipation, unspecified: Secondary | ICD-10-CM | POA: Diagnosis not present

## 2023-05-25 DIAGNOSIS — H353121 Nonexudative age-related macular degeneration, left eye, early dry stage: Secondary | ICD-10-CM | POA: Diagnosis not present

## 2023-05-25 DIAGNOSIS — H524 Presbyopia: Secondary | ICD-10-CM | POA: Diagnosis not present

## 2023-05-25 DIAGNOSIS — Z961 Presence of intraocular lens: Secondary | ICD-10-CM | POA: Diagnosis not present

## 2023-06-30 DIAGNOSIS — Z87891 Personal history of nicotine dependence: Secondary | ICD-10-CM | POA: Diagnosis not present

## 2023-06-30 DIAGNOSIS — H6123 Impacted cerumen, bilateral: Secondary | ICD-10-CM | POA: Diagnosis not present

## 2023-07-02 ENCOUNTER — Other Ambulatory Visit: Payer: Self-pay | Admitting: Cardiology

## 2023-07-02 DIAGNOSIS — I10 Essential (primary) hypertension: Secondary | ICD-10-CM

## 2023-07-03 ENCOUNTER — Telehealth: Payer: Self-pay | Admitting: *Deleted

## 2023-07-03 NOTE — Telephone Encounter (Signed)
CALLED PATIENT TO INFORM OF CT FOR 07-07-23- ARRIVAL TIME- 12:45 PM @ [facility], NO RESTRICTIONS TO SCAN, PATIENT TO RECEIVE RESULTS FROM DR. KINARD ON 07-20-23 @ 9:30 AM FOR RESULTS, LVM FOR A RETURN CALL

## 2023-07-07 ENCOUNTER — Ambulatory Visit (HOSPITAL_COMMUNITY): Payer: Medicare PPO

## 2023-07-07 ENCOUNTER — Other Ambulatory Visit: Payer: Self-pay | Admitting: Radiology

## 2023-07-10 ENCOUNTER — Telehealth: Payer: Self-pay | Admitting: *Deleted

## 2023-07-10 NOTE — Telephone Encounter (Signed)
CALLED PATIENT TO ASK ABOUT RESCHEDULING MISSED CT , LVM FOR A RETURN CALL

## 2023-07-13 ENCOUNTER — Telehealth: Payer: Self-pay | Admitting: *Deleted

## 2023-07-13 ENCOUNTER — Ambulatory Visit: Payer: Medicare PPO | Admitting: Radiation Oncology

## 2023-07-13 NOTE — Telephone Encounter (Signed)
Called patient to ask about rescheduling missed CT and fu, lvm for a return call

## 2023-07-14 ENCOUNTER — Telehealth: Payer: Self-pay | Admitting: *Deleted

## 2023-07-14 NOTE — Telephone Encounter (Signed)
CALLED PATIENT TO INFORM OF CT FOR 07-21-23, ARRIVAL TIME- 4:15 PM @ [facility], NO RESTRICTIONS TO SCAN, PATIENT TO RECEIVE RESULTS FROM DR. KINARD ON 07-24-23 @ 3:45 PM, SPOKE WITH PATIENT AND HE IS AWARE OF THESE APPTS. AND THE INSTRUCTIONS

## 2023-07-20 ENCOUNTER — Ambulatory Visit: Payer: Medicare PPO | Admitting: Radiation Oncology

## 2023-07-21 ENCOUNTER — Ambulatory Visit (HOSPITAL_COMMUNITY)
Admission: RE | Admit: 2023-07-21 | Discharge: 2023-07-21 | Disposition: A | Discharge status: Home / Self Care | Payer: Medicare PPO | Source: Ambulatory Visit | Attending: Radiology | Admitting: Radiology

## 2023-07-21 DIAGNOSIS — I700 Atherosclerosis of aorta: Secondary | ICD-10-CM | POA: Diagnosis not present

## 2023-07-21 DIAGNOSIS — C3412 Malignant neoplasm of upper lobe, left bronchus or lung: Secondary | ICD-10-CM | POA: Insufficient documentation

## 2023-07-21 DIAGNOSIS — C3490 Malignant neoplasm of unspecified part of unspecified bronchus or lung: Secondary | ICD-10-CM | POA: Diagnosis not present

## 2023-07-21 DIAGNOSIS — J432 Centrilobular emphysema: Secondary | ICD-10-CM | POA: Diagnosis not present

## 2023-07-24 ENCOUNTER — Telehealth: Payer: Self-pay | Admitting: *Deleted

## 2023-07-24 ENCOUNTER — Ambulatory Visit
Admission: RE | Admit: 2023-07-24 | Discharge: 2023-07-24 | Disposition: A | Discharge status: Home / Self Care | Payer: Medicare PPO | Source: Ambulatory Visit | Attending: Radiation Oncology | Admitting: Radiation Oncology

## 2023-07-24 ENCOUNTER — Encounter: Payer: Self-pay | Admitting: Radiation Oncology

## 2023-07-24 VITALS — BP 134/85 | HR 71 | Temp 97.4°F | Resp 19

## 2023-07-24 DIAGNOSIS — C3412 Malignant neoplasm of upper lobe, left bronchus or lung: Secondary | ICD-10-CM

## 2023-07-24 NOTE — Telephone Encounter (Signed)
 xxxxx

## 2023-07-24 NOTE — Progress Notes (Addendum)
 Richard Vincent. is here today for follow up post radiation to the lung.  Lung Side: Left side last treatment was 02/07/2020.  Does the patient complain of any of the following: Pain:Denies Shortness of breath w/wo exertion: Denies Cough: He reports that it is rare that he coughs. Hemoptysis: Denies Pain with swallowing: Denies Swallowing/choking concerns: Denies Appetite: Good Energy Level: Good  Post radiation skin Changes: Denies   BP 134/85 (BP Location: Right Arm)   Pulse 71   Temp (!) 97.4 F (36.3 C) (Oral)   Resp 19   SpO2 100%

## 2023-07-24 NOTE — Progress Notes (Signed)
 Radiation Oncology         [phone]) [phone] ________________________________  Name: Richard Vincent. MRN: [id number]  Date: 07/24/2023  DOB: 1932/02/06  Follow-Up Visit Note  CC: [name], MD  [name], DO    ICD-10-CM   1. Primary cancer of left upper lobe of lung (HCC)  C34.12 CT CHEST WO CONTRAST      Diagnosis: Stage IA3 (T1c,N0, M0) squamous cell carcinoma of the left upper lobe    Interval Since Last Radiation: 3 years, 5 months, and 14 days    Radiation Treatment Dates: 01/29/2020 through 02/07/2020 Site Technique Total Dose (Gy) Dose per Fx (Gy) Completed Fx Beam Energies  Lung, Left: Lung_Lt IMRT 60/60 12 5/5 6XFFF    Narrative:  The patient returns today for routine follow-up and to review recent imaging. He was last seen here for follow-up on 01/05/23. His most recent chest CT on 07/20/22 demonstrates no evidence of recurrent or metastatic disease, and stable bandlike post treatment appearance of the lingula. The other previously demonstrated smaller likely benign pulmonary nodules also appear stable, and likely represent an infectious or inflammatory etiology.            Patient reports to be doing well overall today. He denies any shortness of breath new from his baseline or chest pain. He notes an intermittent, but denies any hemoptysis.                        Allergies:  is allergic to latex.  Meds: Current Outpatient Medications  Medication Sig Dispense Refill   amLODipine  (NORVASC ) 10 MG tablet Take 1 tablet (10 mg total) by mouth daily. Please call [phone] to schedule an overdue appointment for future refills. Thank you. 90 tablet 0   apixaban  (ELIQUIS ) 2.5 MG TABS tablet TAKE 1 TABLET BY MOUTH TWICE A DAY 180 tablet 1   ascorbic acid (VITAMIN C) 500 MG tablet Take 1,000 mg by mouth every evening.     cholecalciferol (VITAMIN D3) 25 MCG (1000 UNIT) tablet Take 1,000 Units by mouth in the morning and at bedtime.     metoprolol  succinate  (TOPROL -XL) 50 MG 24 hr tablet TAKE ONE TABLET BY MOUTH DAILY 90 tablet 3   Investigational - Study Medication Take 1 tablet by mouth daily. Preventable study drug: Atorvastatin 40 mg or placebo (Patient not taking: Reported on 01/05/2023)     lidocaine  (LIDODERM ) 5 % Place 1 patch onto the skin daily. Remove & Discard patch within 12 hours or as directed by MD (Patient not taking: Reported on 07/24/2023) 30 patch 0   OVER THE COUNTER MEDICATION Take 8 g by mouth daily. Psyllium husk fiber (Patient not taking: Reported on 07/24/2023)     No current facility-administered medications for this encounter.    Physical Findings: The patient is in no acute distress. Patient is alert and oriented.  oral temperature is 97.4 F (36.3 C) (abnormal). His blood pressure is 134/85 and his pulse is 71. His respiration is 19 and oxygen saturation is 100%. .  No significant changes. Lungs are clear to auscultation bilaterally. Heart has regular rate and rhythm. No palpable cervical, supraclavicular, or axillary adenopathy. Abdomen soft, non-tender, normal bowel sounds.   Lab Findings: Lab Results  Component Value Date   WBC 7.2 03/22/2023   HGB 14.1 03/22/2023   HCT 42.5 03/22/2023   MCV 96.6 03/22/2023   PLT 232 03/22/2023    Radiographic Findings: CT Chest Wo Contrast  Result Date: 07/24/2023 CLINICAL DATA:  Non-small cell lung cancer monitoring * Tracking Code: BO * EXAM: CT CHEST WITHOUT CONTRAST TECHNIQUE: Multidetector CT imaging of the chest was performed following the standard protocol without IV contrast. RADIATION DOSE REDUCTION: This exam was performed according to the departmental dose-optimization program which includes automated exposure control, adjustment of the mA and/or kV according to patient size and/or use of iterative reconstruction technique. COMPARISON:  01/03/2023 FINDINGS: Cardiovascular: Aortic atherosclerosis. Aortic valve calcifications. Normal heart size. Left and right coronary  artery calcifications. Enlargement of the main pulmonary artery measuring up to 4.0 cm in caliber. No pericardial effusion. Mediastinum/Nodes: No enlarged mediastinal, hilar, or axillary lymph nodes. Thyroid  gland, trachea, and esophagus demonstrate no significant findings. Lungs/Pleura: Unchanged bandlike post treatment appearance of the lingula with biopsy markers and fiducials (series 6, image 83). Moderate centrilobular emphysema. Tiny 0.2 cm nodule of the dependent left lower lobe is calcified and definitively benign, requiring no specific further follow-up or characterization (series 6, image 139). Multiple additional small bilateral pulmonary nodules unchanged, some of which are additionally calcified (e.g. Series 6, image 115, 63). No pleural effusion or pneumothorax. Upper Abdomen: No acute abnormality. Cholelithiasis. Atrophic right kidney. Musculoskeletal: No chest wall abnormality. No acute osseous findings. IMPRESSION: 1. Unchanged bandlike post treatment appearance of the lingula with biopsy markers and fiducials. No evidence of recurrent or metastatic disease in the chest. 2. Small pulmonary nodules, stable, many of which are calcified, presumed benign sequelae of infection or inflammation. Attention on follow-up. 3. Emphysema. 4. Enlargement of the main pulmonary artery, as can be seen in pulmonary hypertension. 5. Coronary artery disease. 6. Cholelithiasis. 7. Aortic valve calcifications. Correlate for echocardiographic evidence of aortic valve dysfunction. Aortic Atherosclerosis (ICD10-I70.0) and Emphysema (ICD10-J43.9). Electronically Signed   By: Marolyn JONETTA Jaksch M.D.   On: 07/24/2023 10:14    Impression: Stage IA3 (T1c,N0, M0) squamous cell carcinoma of the left upper lobe    We personally reviewed this patient's most recent CT scan. Imaging demonstrates stable pulmonary nodules with no evidence of recurrent or metastatic disease in the chest.   Plan: Follow up chest CT in 6 months. Routine  office visit 1-2 days after scan to review results. Patient knows to call with any questions or concerns in the meantime.   20 minutes of total time was spent for this patient encounter, including preparation, face-to-face counseling with the patient and coordination of care, physical exam, and documentation of the encounter. ____________________________________   Leeroy Due, PA-C   This document serves as a record of services personally performed by Leeroy Due, PA-C. It was created on his behalf by Dorthy Fuse, a trained medical scribe. The creation of this record is based on the scribe's personal observations and the provider's statements to them. This document has been checked and approved by the attending provider.

## 2023-09-03 ENCOUNTER — Other Ambulatory Visit: Payer: Self-pay | Admitting: Cardiology

## 2023-09-03 DIAGNOSIS — I480 Paroxysmal atrial fibrillation: Secondary | ICD-10-CM

## 2023-09-04 NOTE — Telephone Encounter (Signed)
Prescription refill request for Eliquis received. Indication:afib Last office visit:2/24 Scr:1.81  9/24 Age: 89 Weight:79.4  kg  Prescription refilled

## 2023-09-05 ENCOUNTER — Other Ambulatory Visit: Payer: Self-pay | Admitting: Cardiology

## 2023-09-05 DIAGNOSIS — I10 Essential (primary) hypertension: Secondary | ICD-10-CM

## 2023-09-15 NOTE — Telephone Encounter (Signed)
*  STAT* If patient is at the pharmacy, call can be transferred to refill team.   1. Which medications need to be refilled? (please list name of each medication and dose if known)  Eliquis   2. Would you like to learn more about the convenience, safety, & potential cost savings by using the [facility]?     3. Are you open to using the [facility] (Type [facility].    4. Which pharmacy/location (including street and city if local pharmacy) is medication to be sent to?[facility] [address] [place]   5. Do they need a 30 day or 90 day supply? [address] and refills

## 2023-09-29 ENCOUNTER — Other Ambulatory Visit: Payer: Self-pay | Admitting: Cardiology

## 2023-09-29 DIAGNOSIS — I10 Essential (primary) hypertension: Secondary | ICD-10-CM

## 2023-10-04 DIAGNOSIS — H6122 Impacted cerumen, left ear: Secondary | ICD-10-CM | POA: Diagnosis not present

## 2023-10-18 ENCOUNTER — Ambulatory Visit: Payer: Medicare PPO | Admitting: General Practice

## 2023-10-21 ENCOUNTER — Other Ambulatory Visit: Payer: Self-pay | Admitting: Cardiology

## 2023-11-16 ENCOUNTER — Other Ambulatory Visit: Payer: Self-pay | Admitting: Cardiology

## 2023-11-16 DIAGNOSIS — I10 Essential (primary) hypertension: Secondary | ICD-10-CM

## 2023-11-22 NOTE — Progress Notes (Signed)
 HPI: FU atrial fibrillation. Nuclear study December 2016 showed ejection fraction 53%, prior infarct but no ischemia. Patient found to be in atrial fibrillation following surgical procedure in July 2018 at [facility]. Echo July 2018 showed normal LV function, grade 2 diastolic dysfunction, mild mitral regurgitation and mild left atrial enlargement. Plan was to proceed with cardioversion if atrial fibrillation persisted as this may have been postoperative atrial fibrillation.  However he converted spontaneously but atrial fibrillation recurred.  Patient had [employer] screening in September 2019 that showed mild carotid artery disease bilaterally, no abdominal aortic aneurysm and normal ABIs.  Since I last saw him, he denies dyspnea, chest pain, palpitations, syncope, bleeding or falls.  He does have some fatigue.  Current Outpatient Medications  Medication Sig Dispense Refill   amLODipine  (NORVASC ) 10 MG tablet TAKE 1 TABLET BY MOUTH DAILY 30 tablet 0   ascorbic acid (VITAMIN C) 500 MG tablet Take 1,000 mg by mouth every evening.     cholecalciferol (VITAMIN D3) 25 MCG (1000 UNIT) tablet Take 1,000 Units by mouth in the morning and at bedtime.     ELIQUIS  2.5 MG TABS tablet TAKE 1 TABLET BY MOUTH 2 TIMES A DAY 180 tablet 1   metoprolol  succinate (TOPROL -XL) 50 MG 24 hr tablet TAKE 1 TABLET BY MOUTH DAILY 90 tablet 0   Investigational - Study Medication Take 1 tablet by mouth daily. Preventable study drug: Atorvastatin 40 mg or placebo (Patient not taking: Reported on 01/05/2023)     lidocaine  (LIDODERM ) 5 % Place 1 patch onto the skin daily. Remove & Discard patch within 12 hours or as directed by MD (Patient not taking: Reported on 12/06/2023) 30 patch 0   OVER THE COUNTER MEDICATION Take 8 g by mouth daily. Psyllium husk fiber (Patient not taking: Reported on 10/21/2022)     No current facility-administered medications for this visit.     Past Medical History:  Diagnosis Date   Asthma     childhood    Basal cell cancer    nose   BPH (benign prostatic hyperplasia)    GERD (gastroesophageal reflux disease)    occ   Gout    changed diet years ago ; no gout attacks since    History of radiation therapy 01/29/2020-02/07/2020   SBRT to left lung       Dr Retta Caster   Hypertension    Lung cancer [facility]) 2021   Paroxysmal A-fib (HCC)    occurred post-operatively ; last EKG he was in NSR    Renal insufficiency    non problematic     Past Surgical History:  Procedure Laterality Date   BRONCHIAL BIOPSY  11/12/2019   Procedure: BRONCHIAL BIOPSIES;  Surgeon: Prudy Brownie, DO;  Location: [facility];  Service: Pulmonary;;   BRONCHIAL BRUSHINGS  11/12/2019   Procedure: BRONCHIAL BRUSHINGS;  Surgeon: Prudy Brownie, DO;  Location: [facility];  Service: Pulmonary;;   BRONCHIAL WASHINGS  11/12/2019   Procedure: BRONCHIAL WASHINGS;  Surgeon: Prudy Brownie, DO;  Location: [facility];  Service: Pulmonary;;   ENDOBRONCHIAL ULTRASOUND  11/12/2019   Procedure: ENDOBRONCHIAL ULTRASOUND;  Surgeon: Prudy Brownie, DO;  Location: [facility];  Service: Pulmonary;;   EYE SURGERY  about 5-6 years ago    cataracts bilateral    FIDUCIAL MARKER PLACEMENT  11/12/2019   Procedure: FIDUCIAL MARKER PLACEMENT;  Surgeon: Prudy Brownie, DO;  Location: [facility];  Service: Pulmonary;;   FINE NEEDLE ASPIRATION  11/12/2019  Procedure: FINE NEEDLE ASPIRATION (FNA) LINEAR;  Surgeon: Prudy Brownie, DO;  Location: [facility];  Service: Pulmonary;;   NOSE SURGERY     Skin cancer removed     TONSILLECTOMY     TRANSURETHRAL RESECTION OF PROSTATE     VIDEO BRONCHOSCOPY WITH ENDOBRONCHIAL NAVIGATION N/A 11/12/2019   Procedure: VIDEO BRONCHOSCOPY WITH ENDOBRONCHIAL NAVIGATION;  Surgeon: Prudy Brownie, DO;  Location: [facility];  Service: Pulmonary;  Laterality: N/A;   XI ROBOTIC ASSISTED SIMPLE PROSTATECTOMY N/A 12/14/2017   Procedure: XI ROBOTIC ASSISTED SIMPLE PROSTATECTOMY;   Surgeon: Marco Severs, MD;  Location: [facility];  Service: Urology;  Laterality: N/A;    Social History   Socioeconomic History   Marital status: Married    Spouse name: Not on file   Number of children: 6   Years of education: Not on file   Highest education level: Not on file  Occupational History    Comment: Engineer  Tobacco Use   Smoking status: Former    Current packs/day: 0.00    Average packs/day: 1 pack/day for 30.0 years (30.0 ttl pk-yrs)    Types: Cigarettes    Start date: 10/16/1960    Quit date: 10/17/1990    Years since quitting: 33.1   Smokeless tobacco: Never  Vaping Use   Vaping status: Never Used  Substance and Sexual Activity   Alcohol use: Yes    Alcohol/week: 1.0 - 2.0 standard drink of alcohol    Types: 1 - 2 Glasses of wine per week    Comment: every other day   Drug use: No   Sexual activity: Not on file  Other Topics Concern   Not on file  Social History Narrative   Not on file   Social Drivers of Health   Financial Resource Strain: Not on file  Food Insecurity: No Food Insecurity (10/21/2022)   Hunger Vital Sign    Worried About Running Out of Food in the Last Year: Never true    Ran Out of Food in the Last Year: Never true  Transportation Needs: No Transportation Needs (10/21/2022)   PRAPARE - Administrator, Civil Service (Medical): No    Lack of Transportation (Non-Medical): No  Physical Activity: Not on file  Stress: Not on file  Social Connections: Not on file  Intimate Partner Violence: Not on file    Family History  Problem Relation Age of Onset   Coronary artery disease Other        No family history    ROS: no fevers or chills, productive cough, hemoptysis, dysphasia, odynophagia, melena, hematochezia, dysuria, hematuria, rash, seizure activity, orthopnea, PND, pedal edema, claudication. Remaining systems are negative.  Physical Exam: Well-developed well-nourished in no acute distress.  Skin is warm and dry.   HEENT is normal.  Neck is supple.  Chest is clear to auscultation with normal expansion.  Cardiovascular exam is irregular Abdominal exam nontender or distended. No masses palpated. Extremities show no edema. neuro grossly intact  EKG Interpretation Date/Time:  Wednesday Dec 06 2023 10:00:39 EDT Ventricular Rate:  54 PR Interval:    QRS Duration:  74 QT Interval:  418 QTC Calculation: 396 R Axis:   105  Text Interpretation: Atrial fibrillation with slow ventricular response Low voltage QRS Cannot rule out lateral MI Confirmed by Alexandria Angel ([id number]) on 12/06/2023 10:02:44 AM    A/P  1 permanent atrial fibrillation-continue Toprol  (will decrease to 25 mg daily to allow his heart rate to run higher  as well as his blood pressure; hopefully this will improve his fatigue) and apixaban  at present dose.  2 hypertension-patient's blood pressure is controlled.  Continue present medical regimen.  3 palpitations-symptoms are controlled.  Continue beta-blocker at present dose.  4 Chronic stage 3 kidney disease  Alexandria Angel, MD

## 2023-12-06 ENCOUNTER — Ambulatory Visit: Attending: Cardiology | Admitting: Cardiology

## 2023-12-06 ENCOUNTER — Encounter: Payer: Self-pay | Admitting: Cardiology

## 2023-12-06 VITALS — BP 102/60 | HR 54 | Ht 73.0 in | Wt 169.0 lb

## 2023-12-06 DIAGNOSIS — I10 Essential (primary) hypertension: Secondary | ICD-10-CM

## 2023-12-06 DIAGNOSIS — I4821 Permanent atrial fibrillation: Secondary | ICD-10-CM

## 2023-12-06 DIAGNOSIS — R002 Palpitations: Secondary | ICD-10-CM

## 2023-12-06 MED ORDER — METOPROLOL SUCCINATE ER 25 MG PO TB24: 25.0000 mg | ORAL_TABLET | Freq: Every day | ORAL | 3 refills | Status: DC

## 2023-12-06 NOTE — Patient Instructions (Signed)
 Medication Instructions:   DECREASE METOPROLOL  TO 25 MG ONCE DAILY= 1/2 OF THE 50 MG TABLET ONCE DAILY  *If you need a refill on your cardiac medications before your next appointment, please call your pharmacy*   Follow-Up: At [facility], you and your health needs are our priority.  As part of our continuing mission to provide you with exceptional heart care, our providers are all part of one team.  This team includes your primary Cardiologist (physician) and Advanced Practice Providers or APPs (Physician Assistants and Nurse Practitioners) who all work together to provide you with the care you need, when you need it.  Your next appointment:   12 month(s)  Provider:   Alexandria Angel, MD

## 2023-12-15 ENCOUNTER — Telehealth: Payer: Self-pay | Admitting: Cardiology

## 2023-12-15 DIAGNOSIS — I10 Essential (primary) hypertension: Secondary | ICD-10-CM

## 2023-12-15 MED ORDER — AMLODIPINE BESYLATE 10 MG PO TABS: 10.0000 mg | ORAL_TABLET | Freq: Every day | ORAL | 3 refills | Status: AC

## 2023-12-15 NOTE — Telephone Encounter (Signed)
 Patient came by office stating he needs a refill on Amlodipine  10 MG tab sent to [facility] on [address] in [place]. Please advise.

## 2023-12-15 NOTE — Telephone Encounter (Signed)
 Pt's medication was sent to pt's pharmacy as requested. Confirmation received.

## 2024-01-10 ENCOUNTER — Encounter: Payer: Self-pay | Admitting: *Deleted

## 2024-01-11 ENCOUNTER — Telehealth: Payer: Self-pay | Admitting: *Deleted

## 2024-01-11 NOTE — Telephone Encounter (Signed)
 CALLED PATIENT TO INFORM OF CT FOR 01-24-24- ARRIVAL TIME- 4:15 PM @ [facility], NO RESTRICTIONS TO SCAN, PATIENT TO RECEIVE RESULTS FROM DR. KINARD ON 01-25-24 @ 11:45 AM, SPOKE WITH PATIENT AND HE IS AWARE OF THESE APPTS. AND THE INSTRUCTIONS

## 2024-01-22 ENCOUNTER — Other Ambulatory Visit: Payer: Self-pay

## 2024-01-22 MED ORDER — METOPROLOL SUCCINATE ER 25 MG PO TB24: 25.0000 mg | ORAL_TABLET | Freq: Every day | ORAL | 3 refills | Status: AC

## 2024-01-24 ENCOUNTER — Ambulatory Visit (HOSPITAL_COMMUNITY)
Admission: RE | Admit: 2024-01-24 | Discharge: 2024-01-24 | Disposition: A | Discharge status: Home / Self Care | Source: Ambulatory Visit | Attending: Radiology | Admitting: Radiology

## 2024-01-24 DIAGNOSIS — C3412 Malignant neoplasm of upper lobe, left bronchus or lung: Secondary | ICD-10-CM | POA: Insufficient documentation

## 2024-01-24 NOTE — Progress Notes (Signed)
 Radiation Oncology         [phone]) [phone] ________________________________  Name: Richard Vincent. MRN: [id number]  Date: 01/25/2024  DOB: July 23, 1931  Follow-Up Visit Note  CC: [name], MD  [name], DO  No diagnosis found.  Diagnosis:  Stage IA3 (T1c,N0, M0) squamous cell carcinoma of the left upper lobe     Interval Since Last Radiation: 3 years, 11 months, and 18 days     Radiation Treatment Dates: 01/29/2020 through 02/07/2020 Site Technique Total Dose (Gy) Dose per Fx (Gy) Completed Fx Beam Energies  Lung, Left: Lung_Lt IMRT 60/60 12 5/5 6XFFF     Narrative:  The patient returns today for routine follow-up and to review most recent imaging. He was last seen in office on 07/24/23  for a follow up visit. Patient continued to follow up with their specialists to manage their chronic conditions.   Most recent CT chest preformed on 01/24/24 showed ***  No other significant oncologic interval history since the patient was last seen.                                Allergies:  is allergic to latex.  Meds: Current Outpatient Medications  Medication Sig Dispense Refill   amLODipine  (NORVASC ) 10 MG tablet Take 1 tablet (10 mg total) by mouth daily. 90 tablet 3   ascorbic acid (VITAMIN C) 500 MG tablet Take 1,000 mg by mouth every evening.     cholecalciferol (VITAMIN D3) 25 MCG (1000 UNIT) tablet Take 1,000 Units by mouth in the morning and at bedtime.     ELIQUIS  2.5 MG TABS tablet TAKE 1 TABLET BY MOUTH 2 TIMES A DAY 180 tablet 1   Investigational - Study Medication Take 1 tablet by mouth daily. Preventable study drug: Atorvastatin 40 mg or placebo (Patient not taking: Reported on 01/05/2023)     lidocaine  (LIDODERM ) 5 % Place 1 patch onto the skin daily. Remove & Discard patch within 12 hours or as directed by MD (Patient not taking: Reported on 12/06/2023) 30 patch 0   metoprolol  succinate (TOPROL -XL) 25 MG 24 hr tablet Take 1 tablet (25 mg total) by mouth daily. 90  tablet 3   OVER THE COUNTER MEDICATION Take 8 g by mouth daily. Psyllium husk fiber (Patient not taking: Reported on 10/21/2022)     No current facility-administered medications for this encounter.    Physical Findings: The patient is in no acute distress. Patient is alert and oriented.  vitals were not taken for this visit. .  No significant changes. Lungs are clear to auscultation bilaterally. Heart has regular rate and rhythm. No palpable cervical, supraclavicular, or axillary adenopathy. Abdomen soft, non-tender, normal bowel sounds.   Lab Findings: Lab Results  Component Value Date   WBC 7.2 03/22/2023   HGB 14.1 03/22/2023   HCT 42.5 03/22/2023   MCV 96.6 03/22/2023   PLT 232 03/22/2023    Radiographic Findings: No results found.  Impression: Stage IA3 (T1c,N0, M0) squamous cell carcinoma of the left upper lobe    The patient is recovering from the effects of radiation.  ***  Plan:  ***   *** minutes of total time was spent for this patient encounter, including preparation, face-to-face counseling with the patient and coordination of care, physical exam, and documentation of the encounter. ____________________________________  Lynwood CHARM Nasuti, PhD, MD  This document serves as a record of services personally performed by  Lynwood Nasuti, MD. It was created on his behalf by Reymundo Cartwright, a trained medical scribe. The creation of this record is based on the scribe's personal observations and the provider's statements to them. This document has been checked and approved by the attending provider.

## 2024-01-25 ENCOUNTER — Ambulatory Visit
Admission: RE | Admit: 2024-01-25 | Discharge: 2024-01-25 | Disposition: A | Discharge status: Home / Self Care | Payer: Self-pay | Source: Ambulatory Visit | Attending: Radiation Oncology | Admitting: Radiation Oncology

## 2024-01-25 VITALS — BP 121/58 | HR 53 | Temp 97.5°F | Resp 18 | Ht 73.0 in | Wt 169.0 lb

## 2024-01-25 DIAGNOSIS — C3412 Malignant neoplasm of upper lobe, left bronchus or lung: Secondary | ICD-10-CM

## 2024-01-25 NOTE — Progress Notes (Addendum)
 Richard Vincent. returns today for routine follow-up and to review most recent imaging. Most recent CT chest preformed on 01/24/24.  Lung Side: Left  Radiation Treatment Dates: 01/29/2020 through 02/07/2020   Does the patient complain of any of the following: Pain: Denies pain to chest but states that he had left flank pain. Shortness of breath w/wo exertion: Denies  Cough: Denies Hemoptysis: Denies Pain with swallowing: Denies Swallowing/choking concerns: Denies Appetite: [employer] Level: Low Post radiation skin Changes: Denies    Additional comments if applicable:No other complaints BP (!) 121/58 (BP Location: Left Arm, Patient Position: Sitting)   Pulse (!) 53   Temp (!) 97.5 F (36.4 C) (Temporal)   Resp 18   Ht 6' 1 (1.854 m)   Wt 169 lb (76.7 kg)   SpO2 100%   BMI 22.30 kg/m

## 2024-03-12 ENCOUNTER — Other Ambulatory Visit: Payer: Self-pay | Admitting: Cardiology

## 2024-03-12 DIAGNOSIS — I480 Paroxysmal atrial fibrillation: Secondary | ICD-10-CM

## 2024-03-12 NOTE — Telephone Encounter (Signed)
 Prescription refill request for Eliquis  received. Indication:afib Last office visit:5/25 Scr:1.81  9/24 Age: 89 Weight:76.7  kg  Prescription refilled

## 2024-07-22 ENCOUNTER — Telehealth: Payer: Self-pay | Admitting: *Deleted

## 2024-07-22 NOTE — Telephone Encounter (Signed)
 Called patient to inform of CT for 07-26-24- arrival time- 5:15 pm @ [facility], no restrictions to scan, patient to receive results from Dr. Shannon on 08-05-24 @ 11:45 am, spoke with patient and he is aware of these appts. and the instructions

## 2024-07-23 NOTE — Progress Notes (Signed)
 Dermatologic Surgery Note   Referring Physician:   Manuelita Annett Foy, MD [phone] [place],  [place] [address]   History: Tumor: Basal Cell Carcinoma (Left Cheek)  Final Diagnosis  A. SKIN, LEFT CHEEK, BIOPSY:              BASAL CELL CARCINOMA, NODULAR TYPE EXTENDING TO THE BASE OF THE BIOPSY.   B. SKIN, RIGHT TEMPLE, BIOPSY:              FRAGMENTS OF EPITHELIUM AT THE EDGE OF AN ULCER (SEE COMMENT).    Electronically signed by Wanda Dee, MD on 07/02/2024 at 1555 EST    Duration:  6-12 months  Previous Treatment: No Primary Symptom:  none Previous skin cancer: Yes - NMSC  Surgical History[1]  Allergies[2].   Presence of lumps, bumps, and/or masses:  No  Warfarin: No Aspirin: No Other prescription anticoagulants: Yes - Eliquis  _____________________________________________________ PE: Vitals:   07/23/24 0954  BP: 126/60  Pulse: 51   Good spirits and in no acute distress, pleasant and cooperative. Exam of the face was performed.  There is a lesion consistent with a Basal Cell Carcinoma (Left Cheek)  identified and confirmed as the biopsy site by the patient.   DX Basal Cell Carcinoma (Left Cheek)   RX The above diagnosis, treatment options, tumor biology, and indications for Mohs surgery being size, site, and indistinct margins were discussed, as well as treatment alternatives. After discussion of the risks and benefits including but not limited to scar, bleeding, nerve damage(and resultant relevant deficit), infection, and pain, informed consent was obtained.  A timeout was performed and the above tumor(s) was excised using Mohs technique followed by primary repair. (See operative note for details)  Follow-up:  Patient was provided with verbal and written post-operative instructions including wound care, signs and symptoms of an infection, indications for contacting our offices, and our office contact information including after-hours contacts.   Recommended that the patient use OTC pain medication as needed for pain.  After the appropriate surgical follow-up, the patient will be referred back to their referring physician for continued care. RTC 1 week MD visit - suture removal , re-check right temple and possible bx  Electronically signed by:  Wanda Dee, MD 07/23/2024 1:32 PM        [1] Past Surgical History: Procedure Laterality Date   FOREHEAD FLAP N/A 01/23/2017   Procedure: FOREHEAD FLAP INSET / REVISION;  Surgeon: Redell LELON Dural, MD;  [facility];  Service: ENT;  Laterality: N/A;  inset flap; site prep; devision of septal flap   MOHS SURGERY  2015   Procedure: MOHS SURGERY   MOHS SURGERY  2018   Procedure: MOHS SURGERY   OTHER SURGICAL HISTORY  2015   Procedure: OTHER SURGICAL HISTORY (cataract)   PROSTATE SURGERY  2010   Procedure: PROSTATE SURGERY   TONSILLECTOMY     Procedure: TONSILLECTOMY  [2] Allergies Allergen Reactions   Latex Rash    Mild rash

## 2024-07-26 ENCOUNTER — Ambulatory Visit (HOSPITAL_COMMUNITY)
Admission: RE | Admit: 2024-07-26 | Discharge: 2024-07-26 | Disposition: A | Source: Ambulatory Visit | Attending: Radiology

## 2024-07-26 DIAGNOSIS — C3412 Malignant neoplasm of upper lobe, left bronchus or lung: Secondary | ICD-10-CM | POA: Diagnosis present

## 2024-07-31 NOTE — Progress Notes (Signed)
 Dermatologic Surgery Follow-up  Date of Procedure: 07/23/2024 Date of Visit: 07/31/2024  Subjective: Richard Vincent is here 1 week post-op after Mohs surgery for removal of a BCC of left cheek with primary repair. Doing well with no issues.   Objective: Patient appears well-nourished, alert and oriented.  Exam of cheek reveals wound edges well-approximated with no evidence of erythema, edema, or dehiscence.   Assessment: Postoperative visit  Plan:  - Sutures removed.    - Follow up PRN.  - Recommend routine skin checks at regular intervals.

## 2024-08-01 NOTE — Progress Notes (Incomplete)
 "  Radiation Oncology         [phone]) [phone] ________________________________  Name: Richard Vincent. MRN: [id number]  Date: 08/05/2024  DOB: 01-Oct-1931  Follow-Up Visit Note  CC: Richard Loader, MD  Richard Adine CROME, DO  No diagnosis found. ***   Diagnosis:  Stage IA3 (T1c,N0, M0) squamous cell carcinoma of the left upper lobe; s/p SBRT on 02/07/2020   Interval Since Last Radiation: approximately 4 years and 6 months     Radiation Treatment Dates: 01/29/2020 through 02/07/2020 Site Technique Total Dose (Gy) Dose per Fx (Gy) Completed Fx Beam Energies  Lung, Left: Lung_Lt IMRT 60/60 12 5/5 6XFFF     Narrative:  The patient returns today for routine follow-up and to review most recent imaging. He was last seen in office on 07/24/23  for a follow up visit. Patient continued to follow up with their specialists to manage their chronic conditions.   In the interval since he was last seen, he underwent Moh's surgery for Basal Cell Carcinoma on his left cheek on 07/23/2024. He was seen by his dermatologist, Dr. Venancio on 07/30/2024 and was doing well with no issues at that time.   Most recent CT chest preformed on 07/27/2023 showed ***  No other significant oncologic interval history since the patient was last seen.                               ***  Allergies:  is allergic to latex.  Meds: Current Outpatient Medications  Medication Sig Dispense Refill   amLODipine  (NORVASC ) 10 MG tablet Take 1 tablet (10 mg total) by mouth daily. 90 tablet 3   ascorbic acid (VITAMIN C) 500 MG tablet Take 1,000 mg by mouth every evening.     cholecalciferol (VITAMIN D3) 25 MCG (1000 UNIT) tablet Take 1,000 Units by mouth in the morning and at bedtime.     ELIQUIS  2.5 MG TABS tablet TAKE 1 TABLET BY MOUTH 2 TIMES A DAY 180 tablet 1   Investigational - Study Medication Take 1 tablet by mouth daily. Preventable study drug: Atorvastatin 40 mg or placebo     lidocaine  (LIDODERM ) 5 % Place 1 patch onto the skin  daily. Remove & Discard patch within 12 hours or as directed by MD (Patient not taking: Reported on 01/25/2024) 30 patch 0   metoprolol  succinate (TOPROL -XL) 25 MG 24 hr tablet Take 1 tablet (25 mg total) by mouth daily. 90 tablet 3   OVER THE COUNTER MEDICATION Take 8 g by mouth daily. Psyllium husk fiber (Patient not taking: Reported on 01/25/2024)     No current facility-administered medications for this visit.    Physical Findings: The patient is in no acute distress. Patient is alert and oriented.  vitals were not taken for this visit. .  No significant changes. Lungs are clear to auscultation bilaterally. Heart has regular rate and rhythm. No palpable cervical, supraclavicular, or axillary adenopathy. Abdomen soft, non-tender, normal bowel sounds. ***   Lab Findings: Lab Results  Component Value Date   WBC 7.2 03/22/2023   HGB 14.1 03/22/2023   HCT 42.5 03/22/2023   MCV 96.6 03/22/2023   PLT 232 03/22/2023    Radiographic Findings: No results found.   Impression: Stage IA3 (T1c,N0, M0) squamous cell carcinoma of the left upper lobe; s/p SBRT on 02/07/2020  The patient is doing well overall today. We personally reviewed this patient's imaging which showed stable radiation  changes, and no evidence of disease progression or metastasis. ***  Plan:  Follow up chest CT in 6 months. Routine office visit 1-2 days after scan to review results. Patient was encouraged to call with any questions or concerns in the meantime. ***    20 minutes of total time was spent for this patient encounter, including preparation, face-to-face counseling with the patient and coordination of care, physical exam, and documentation of the encounter. ____________________________________   Richard Due, PA-C   This document serves as a record of services personally performed by Richard Due, PA-C. It was created on his behalf by Richard Vincent, a trained medical scribe. The creation of this record is based on  the scribe's personal observations and the provider's statements to them. This document has been checked and approved by the attending provider.   "

## 2024-08-04 NOTE — Progress Notes (Incomplete)
 "  Radiation Oncology         [phone]) [phone] ________________________________  Name: Richard Vincent. MRN: [id number]  Date: 08/05/2024  DOB: Feb 06, 1932  Follow-Up Visit Note  CC: [name], MD  [name], DO  No diagnosis found. ***   Diagnosis:  Stage IA3 (T1c,N0, M0) squamous cell carcinoma of the left upper lobe; s/p SBRT on 02/07/2020   Interval Since Last Radiation: approximately 4 years and 6 months     Radiation Treatment Dates: 01/29/2020 through 02/07/2020 Site Technique Total Dose (Gy) Dose per Fx (Gy) Completed Fx Beam Energies  Lung, Left: Lung_Lt IMRT 60/60 12 5/5 6XFFF     Narrative:  The patient returns today for routine follow-up and to review most recent imaging. He was last seen in office on 01/25/24 for a follow-up visit. He has continued to follow up with his specialists in the interval to manage his chronic conditions.   In the interval since he was last seen, he underwent Moh's surgery for Basal Cell Carcinoma on his left cheek on 07/23/2024. He was seen by his dermatologist, Dr. Venancio on 07/30/2024 and was doing well with no issues at that time.   His most recent chest CT preformed on 07/27/2023 demonstrates: stable post-operative volume loss and scarring in the LUL, stable peripheral RML scarring, and stability of a borderline enlarged right lower paratracheal lymph node. Imaging otherwise shows no progressive disease or evidence of metastatic disease. Other findings of potential clinical significance include an infrarenal abdominal aortic aneurysm measuring up to 3.4 cm in diameter (incompletely visualized on this study but otherwise stable when compared to prior imaging; see note below), a small type 1 hiatal hernia, cholelithiasis, and mild cardiomegaly and aortic valve calcifications.   Of note: He did have an abdominal AA screening US  performed on 06/03/24. The aortic aneurysm measured 3.3 cm on this study. AA measured 3.4 cm on his most recent chest  CT as noted above.   No other significant oncologic interval history since the patient was last seen.                               ***  Allergies:  is allergic to latex.  Meds: Current Outpatient Medications  Medication Sig Dispense Refill   amLODipine  (NORVASC ) 10 MG tablet Take 1 tablet (10 mg total) by mouth daily. 90 tablet 3   ascorbic acid (VITAMIN C) 500 MG tablet Take 1,000 mg by mouth every evening.     cholecalciferol (VITAMIN D3) 25 MCG (1000 UNIT) tablet Take 1,000 Units by mouth in the morning and at bedtime.     ELIQUIS  2.5 MG TABS tablet TAKE 1 TABLET BY MOUTH 2 TIMES A DAY 180 tablet 1   Investigational - Study Medication Take 1 tablet by mouth daily. Preventable study drug: Atorvastatin 40 mg or placebo     lidocaine  (LIDODERM ) 5 % Place 1 patch onto the skin daily. Remove & Discard patch within 12 hours or as directed by MD (Patient not taking: Reported on 01/25/2024) 30 patch 0   metoprolol  succinate (TOPROL -XL) 25 MG 24 hr tablet Take 1 tablet (25 mg total) by mouth daily. 90 tablet 3   OVER THE COUNTER MEDICATION Take 8 g by mouth daily. Psyllium husk fiber (Patient not taking: Reported on 01/25/2024)     No current facility-administered medications for this encounter.    Physical Findings: The patient is in no acute distress.  Patient is alert and oriented.  vitals were not taken for this visit. .  No significant changes. Lungs are clear to auscultation bilaterally. Heart has regular rate and rhythm. No palpable cervical, supraclavicular, or axillary adenopathy. Abdomen soft, non-tender, normal bowel sounds. ***   Lab Findings: Lab Results  Component Value Date   WBC 7.2 03/22/2023   HGB 14.1 03/22/2023   HCT 42.5 03/22/2023   MCV 96.6 03/22/2023   PLT 232 03/22/2023    Radiographic Findings: CT CHEST WO CONTRAST Result Date: 08/02/2024 EXAM: CT CHEST WITHOUT CONTRAST 07/26/2024 05:09:55 PM TECHNIQUE: CT of the chest was performed without the administration  of intravenous contrast. Multiplanar reformatted images are provided for review. Automated exposure control, iterative reconstruction, and/or weight based adjustment of the mA/kV was utilized to reduce the radiation dose to as low as reasonably achievable. COMPARISON: CT chest 01/24/2024. CLINICAL HISTORY: Restaging NON-SMALL cell lung cancer. * Tracking Code: BO * FINDINGS: MEDIASTINUM: Heart: Mild cardiomegaly. Aortic valve calcifications. Coronary artery atheromatous vascular calcifications. Pericardium is unremarkable. The central airways are clear. Vessels: Thoracic aortic and branch vessel atheromatous vascular calcifications. Other: Small type 1 hiatal hernia. LYMPH NODES: Borderline enlarged right lower paratracheal lymph node 1.0 cm in short axis on image 63 series 2, formerly the same. No hilar or axillary lymphadenopathy. LUNGS AND PLEURA: Lungs: Centrilobular emphysema. Volume loss and scarring in the left upper lobe with adjacent postoperative findings. Stable scarring peripherally in the right middle lobe. Several tiny nodules below 5 mm in average diameter are present such as the right lower lobe subpleural nodule on image 59 series 8, no substantial change from 10/23/2019, considered benign. No focal consolidation or pulmonary edema. Pleura: No pleural effusion or pneumothorax. SOFT TISSUES/BONES: Bones: Thoracic spondylosis. No acute abnormality of the bones. Soft tissues: Bilateral common carotid atheromatous vascular calcifications. No acute abnormality of the soft tissues. UPPER ABDOMEN: Limited images of the upper abdomen demonstrate: Cholelithiasis. Stable small hypodense hepatic lesions are probably cysts but technically too small to characterize. Atrophic right kidney with multiple exophytic fluid density lesions favoring cysts, and not warranting further imaging workup. Abdominal aortic atherosclerosis noted with visualized infrarenal abdominal aortic aneurysm up to 3.4 cm in diameter, but  not completely visualized on today's CT of the chest; this aneurysm appears stable from 11/11/2019 PET CT. No other acute abnormality. IMPRESSION: 1. Postoperative volume loss and scarring in the left upper lobe with stable peripheral right middle lobe scarring and stable borderline enlarged right lower paratracheal lymph node, without evidence of new or progressive disease or metastatic disease. 2. Infrarenal abdominal aortic aneurysm up to 3.4 cm in diameter, incompletely visualized but otherwise stable from 2021 ; recommend abdominal aortic ultrasound follow-up in 3 years. 3. Centrilobular emphysema. 4. Thoracic aortic, coronary artery, branch vessel, and bilateral common carotid atherosclerotic calcifications. 5. Mild cardiomegaly and aortic valve calcifications. 6. Small type 1 hiatal hernia. 7. Cholelithiasis. Electronically signed by: Ryan Salvage MD 08/02/2024 10:04 AM EST RP Workstation: [id number]     Impression: Stage IA3 (T1c,N0, M0) squamous cell carcinoma of the left upper lobe; s/p SBRT on 02/07/2020  The patient is doing well overall today. We personally reviewed this patient's imaging which showed stable radiation changes, and no evidence of disease progression or metastasis. ***  Plan:  Follow up chest CT in 6 months. Routine office visit 1-2 days after scan to review results. Patient was encouraged to call with any questions or concerns in the meantime. ***    20 minutes of total time  was spent for this patient encounter, including preparation, face-to-face counseling with the patient and coordination of care, physical exam, and documentation of the encounter. ____________________________________   Leeroy Due, PA-C  This document serves as a record of services personally performed by Lynwood Nasuti, MD and Leeroy Due, PA-C. It was created on their behalf by Dorthy Fuse, a trained medical scribe. The creation of this record is based on the scribe's personal observations and  the provider's statements to them. This document has been checked and approved by the attending provider.  "

## 2024-08-05 ENCOUNTER — Encounter: Payer: Self-pay | Admitting: Radiation Oncology

## 2024-08-05 ENCOUNTER — Ambulatory Visit
Admission: RE | Admit: 2024-08-05 | Discharge: 2024-08-05 | Disposition: A | Source: Ambulatory Visit | Attending: Radiation Oncology | Admitting: Radiation Oncology

## 2024-08-05 ENCOUNTER — Telehealth: Payer: Self-pay | Admitting: Radiation Oncology

## 2024-08-05 ENCOUNTER — Ambulatory Visit
Admission: RE | Admit: 2024-08-05 | Discharge: 2024-08-05 | Disposition: A | Discharge status: Home / Self Care | Source: Ambulatory Visit | Attending: Radiation Oncology | Admitting: Radiation Oncology

## 2024-08-05 ENCOUNTER — Ambulatory Visit
Admission: RE | Admit: 2024-08-05 | Discharge: 2024-08-05 | Disposition: A | Discharge status: Home / Self Care | Payer: Self-pay | Source: Ambulatory Visit | Attending: Radiation Oncology | Admitting: Radiation Oncology

## 2024-08-05 VITALS — BP 121/81 | HR 66 | Temp 98.4°F | Resp 16 | Ht 73.0 in | Wt 168.4 lb

## 2024-08-05 DIAGNOSIS — I700 Atherosclerosis of aorta: Secondary | ICD-10-CM | POA: Insufficient documentation

## 2024-08-05 DIAGNOSIS — Z7901 Long term (current) use of anticoagulants: Secondary | ICD-10-CM | POA: Diagnosis not present

## 2024-08-05 DIAGNOSIS — Z79899 Other long term (current) drug therapy: Secondary | ICD-10-CM | POA: Diagnosis not present

## 2024-08-05 DIAGNOSIS — Z85118 Personal history of other malignant neoplasm of bronchus and lung: Secondary | ICD-10-CM | POA: Diagnosis present

## 2024-08-05 DIAGNOSIS — C3412 Malignant neoplasm of upper lobe, left bronchus or lung: Secondary | ICD-10-CM

## 2024-08-05 DIAGNOSIS — J432 Centrilobular emphysema: Secondary | ICD-10-CM | POA: Diagnosis not present

## 2024-08-05 DIAGNOSIS — I2510 Atherosclerotic heart disease of native coronary artery without angina pectoris: Secondary | ICD-10-CM | POA: Diagnosis not present

## 2024-08-05 DIAGNOSIS — I714 Abdominal aortic aneurysm, without rupture, unspecified: Secondary | ICD-10-CM | POA: Insufficient documentation

## 2024-08-05 DIAGNOSIS — Z923 Personal history of irradiation: Secondary | ICD-10-CM | POA: Diagnosis not present

## 2024-08-05 DIAGNOSIS — Z85828 Personal history of other malignant neoplasm of skin: Secondary | ICD-10-CM | POA: Diagnosis not present

## 2024-08-05 NOTE — Telephone Encounter (Signed)
 1/19 Patient called to r/s at a later time due to weather conditions.

## 2024-08-05 NOTE — Progress Notes (Signed)
 Aboubacar JINNY Alben Raddle. is here today for follow up post radiation to the lung.  He is here today to receive results from his recent CT scan.  Lung Side: Left  Does the patient complain of any of the following: Pain:Denies Shortness of breath w/wo exertion: Denies Cough: Denies Hemoptysis: Denies Pain with swallowing: Denies  Swallowing/choking concerns:  Appetite: Good Energy Level: Good Post radiation skin Changes: Denies    Additional comments if applicable:

## 2024-08-05 NOTE — Progress Notes (Signed)
 "  Radiation Oncology         [phone]) [phone] ________________________________  Name: Richard Vincent. MRN: [id number]  Date: 08/05/2024  DOB: Dec 10, 1931  Follow-Up Visit Note  CC: [name], MD  [name], DO    ICD-10-CM   1. Primary cancer of left upper lobe of lung (HCC)  C34.12 CT CHEST WO CONTRAST       Diagnosis:  Stage IA3 (T1c,N0, M0) squamous cell carcinoma of the left upper lobe; s/p SBRT on 02/07/2020   Interval Since Last Radiation: approximately 4 years and 6 months     Radiation Treatment Dates: 01/29/2020 through 02/07/2020 Site Technique Total Dose (Gy) Dose per Fx (Gy) Completed Fx Beam Energies  Lung, Left: Lung_Lt IMRT 60/60 12 5/5 6XFFF     Narrative:  The patient returns today for routine follow-up and to review most recent imaging. He was last seen in office on 07/24/23  for a follow up visit. Patient continued to follow up with their specialists to manage their chronic conditions.   In the interval since he was last seen, he underwent Moh's surgery for Basal Cell Carcinoma on his left cheek on 07/23/2024. He was seen by his dermatologist, Dr. Venancio on 07/30/2024 and was doing well with no issues at that time.   Most recent CT chest preformed on 07/27/2023 showed postoperative volume loss and scarring in the LUL with stable peripheral RML scarring and stable borderline enlarged right lower paratracheal lymph node, without evidence of new or progressive disease; stable AAA; centrilobular emphysema; and thoracic aortic, coronary artery, branch vessel, and bilateral common carotid atherosclerotic calcifications.  No other significant oncologic interval history since the patient was last seen.                               Patient reports to be doing well overall. He denies any shortness of breath, cough, chest pain or any other changes to his breathing since he was last seen by us .   Allergies:  is allergic to latex.  Meds: Current Outpatient Medications   Medication Sig Dispense Refill   amLODipine  (NORVASC ) 10 MG tablet Take 1 tablet (10 mg total) by mouth daily. 90 tablet 3   ascorbic acid (VITAMIN C) 500 MG tablet Take 1,000 mg by mouth every evening.     cholecalciferol (VITAMIN D3) 25 MCG (1000 UNIT) tablet Take 1,000 Units by mouth in the morning and at bedtime.     ELIQUIS  2.5 MG TABS tablet TAKE 1 TABLET BY MOUTH 2 TIMES A DAY 180 tablet 1   Investigational - Study Medication Take 1 tablet by mouth daily. Preventable study drug: Atorvastatin 40 mg or placebo     metoprolol  succinate (TOPROL -XL) 25 MG 24 hr tablet Take 1 tablet (25 mg total) by mouth daily. 90 tablet 3   lidocaine  (LIDODERM ) 5 % Place 1 patch onto the skin daily. Remove & Discard patch within 12 hours or as directed by MD (Patient not taking: Reported on 08/05/2024) 30 patch 0   OVER THE COUNTER MEDICATION Take 8 g by mouth daily. Psyllium husk fiber (Patient not taking: Reported on 08/05/2024)     No current facility-administered medications for this encounter.    Physical Findings: The patient is in no acute distress. Patient is alert and oriented.  height is 6' 1 (1.854 m) and weight is 168 lb 6.4 oz (76.4 kg). His oral temperature is 98.4 F (36.9  C). His blood pressure is 121/81 and his pulse is 66. His respiration is 16 and oxygen saturation is 100%. .  No significant changes. Lungs are clear to auscultation bilaterally. Heart has regular rate and rhythm. No palpable cervical or supraclavicular adenopathy. Abdomen soft, non-tender, normal bowel sounds.    Lab Findings: Lab Results  Component Value Date   WBC 7.2 03/22/2023   HGB 14.1 03/22/2023   HCT 42.5 03/22/2023   MCV 96.6 03/22/2023   PLT 232 03/22/2023    Radiographic Findings: CT CHEST WO CONTRAST Result Date: 08/02/2024 EXAM: CT CHEST WITHOUT CONTRAST 07/26/2024 05:09:55 PM TECHNIQUE: CT of the chest was performed without the administration of intravenous contrast. Multiplanar reformatted images  are provided for review. Automated exposure control, iterative reconstruction, and/or weight based adjustment of the mA/kV was utilized to reduce the radiation dose to as low as reasonably achievable. COMPARISON: CT chest 01/24/2024. CLINICAL HISTORY: Restaging NON-SMALL cell lung cancer. * Tracking Code: BO * FINDINGS: MEDIASTINUM: Heart: Mild cardiomegaly. Aortic valve calcifications. Coronary artery atheromatous vascular calcifications. Pericardium is unremarkable. The central airways are clear. Vessels: Thoracic aortic and branch vessel atheromatous vascular calcifications. Other: Small type 1 hiatal hernia. LYMPH NODES: Borderline enlarged right lower paratracheal lymph node 1.0 cm in short axis on image 63 series 2, formerly the same. No hilar or axillary lymphadenopathy. LUNGS AND PLEURA: Lungs: Centrilobular emphysema. Volume loss and scarring in the left upper lobe with adjacent postoperative findings. Stable scarring peripherally in the right middle lobe. Several tiny nodules below 5 mm in average diameter are present such as the right lower lobe subpleural nodule on image 59 series 8, no substantial change from 10/23/2019, considered benign. No focal consolidation or pulmonary edema. Pleura: No pleural effusion or pneumothorax. SOFT TISSUES/BONES: Bones: Thoracic spondylosis. No acute abnormality of the bones. Soft tissues: Bilateral common carotid atheromatous vascular calcifications. No acute abnormality of the soft tissues. UPPER ABDOMEN: Limited images of the upper abdomen demonstrate: Cholelithiasis. Stable small hypodense hepatic lesions are probably cysts but technically too small to characterize. Atrophic right kidney with multiple exophytic fluid density lesions favoring cysts, and not warranting further imaging workup. Abdominal aortic atherosclerosis noted with visualized infrarenal abdominal aortic aneurysm up to 3.4 cm in diameter, but not completely visualized on today's CT of the chest;  this aneurysm appears stable from 11/11/2019 PET CT. No other acute abnormality. IMPRESSION: 1. Postoperative volume loss and scarring in the left upper lobe with stable peripheral right middle lobe scarring and stable borderline enlarged right lower paratracheal lymph node, without evidence of new or progressive disease or metastatic disease. 2. Infrarenal abdominal aortic aneurysm up to 3.4 cm in diameter, incompletely visualized but otherwise stable from 2021 ; recommend abdominal aortic ultrasound follow-up in 3 years. 3. Centrilobular emphysema. 4. Thoracic aortic, coronary artery, branch vessel, and bilateral common carotid atherosclerotic calcifications. 5. Mild cardiomegaly and aortic valve calcifications. 6. Small type 1 hiatal hernia. 7. Cholelithiasis. Electronically signed by: Ryan Salvage MD 08/02/2024 10:04 AM EST RP Workstation: [id number]     Impression: Stage IA3 (T1c,N0, M0) squamous cell carcinoma of the left upper lobe; s/p SBRT on 02/07/2020  The patient is doing well overall today. We personally reviewed this patient's imaging which showed stable radiation changes, and no evidence of disease progression or metastasis.   He is being followed for his AAA by PCP.   Plan:  Follow up chest CT in 6 months. Routine office visit 1-2 days after scan to review results. Patient was encouraged to call  with any questions or concerns in the meantime.    20 minutes of total time was spent for this patient encounter, including preparation, face-to-face counseling with the patient and coordination of care, physical exam, and documentation of the encounter. ____________________________________   Leeroy Due, PA-C   This document serves as a record of services personally performed by Leeroy Due, PA-C. It was created on his behalf by Reymundo Cartwright, a trained medical scribe. The creation of this record is based on the scribe's personal observations and the provider's statements to them. This  document has been checked and approved by the attending provider.   "

## 2025-02-11 ENCOUNTER — Ambulatory Visit: Admitting: Radiology
